# Patient Record
Sex: Female | Born: 1950
Health system: Southern US, Community
[De-identification: ages and names within clinical notes are randomized; demographics above are authoritative.]

## PROBLEM LIST (undated history)

## (undated) DIAGNOSIS — C50919 Malignant neoplasm of unspecified site of unspecified female breast: Secondary | ICD-10-CM

## (undated) DIAGNOSIS — T7840XA Allergy, unspecified, initial encounter: Secondary | ICD-10-CM

## (undated) DIAGNOSIS — F419 Anxiety disorder, unspecified: Secondary | ICD-10-CM

## (undated) DIAGNOSIS — M1990 Unspecified osteoarthritis, unspecified site: Secondary | ICD-10-CM

## (undated) DIAGNOSIS — R002 Palpitations: Secondary | ICD-10-CM

## (undated) DIAGNOSIS — K219 Gastro-esophageal reflux disease without esophagitis: Secondary | ICD-10-CM

## (undated) DIAGNOSIS — E785 Hyperlipidemia, unspecified: Secondary | ICD-10-CM

## (undated) DIAGNOSIS — Z8619 Personal history of other infectious and parasitic diseases: Secondary | ICD-10-CM

## (undated) DIAGNOSIS — I10 Essential (primary) hypertension: Secondary | ICD-10-CM

## (undated) DIAGNOSIS — Z0000 Encounter for general adult medical examination without abnormal findings: Secondary | ICD-10-CM

## (undated) DIAGNOSIS — F329 Major depressive disorder, single episode, unspecified: Secondary | ICD-10-CM

## (undated) DIAGNOSIS — K5790 Diverticulosis of intestine, part unspecified, without perforation or abscess without bleeding: Secondary | ICD-10-CM

## (undated) DIAGNOSIS — F32A Depression, unspecified: Secondary | ICD-10-CM

## (undated) DIAGNOSIS — Z923 Personal history of irradiation: Secondary | ICD-10-CM

## (undated) DIAGNOSIS — M810 Age-related osteoporosis without current pathological fracture: Secondary | ICD-10-CM

## (undated) DIAGNOSIS — M8580 Other specified disorders of bone density and structure, unspecified site: Principal | ICD-10-CM

## (undated) DIAGNOSIS — M25562 Pain in left knee: Secondary | ICD-10-CM

## (undated) HISTORY — PX: NO PAST SURGERIES: SHX2092

## (undated) HISTORY — DX: Diverticulosis of intestine, part unspecified, without perforation or abscess without bleeding: K57.90

## (undated) HISTORY — DX: Allergy, unspecified, initial encounter: T78.40XA

## (undated) HISTORY — DX: Unspecified osteoarthritis, unspecified site: M19.90

## (undated) HISTORY — DX: Depression, unspecified: F32.A

## (undated) HISTORY — PX: MASTECTOMY: SHX3

## (undated) HISTORY — DX: Hyperlipidemia, unspecified: E78.5

## (undated) HISTORY — DX: Essential (primary) hypertension: I10

## (undated) HISTORY — DX: Age-related osteoporosis without current pathological fracture: M81.0

## (undated) HISTORY — DX: Encounter for general adult medical examination without abnormal findings: Z00.00

## (undated) HISTORY — DX: Gastro-esophageal reflux disease without esophagitis: K21.9

## (undated) HISTORY — DX: Anxiety disorder, unspecified: F41.9

## (undated) HISTORY — DX: Palpitations: R00.2

## (undated) HISTORY — DX: Major depressive disorder, single episode, unspecified: F32.9

## (undated) HISTORY — DX: Personal history of other infectious and parasitic diseases: Z86.19

## (undated) HISTORY — DX: Pain in left knee: M25.562

## (undated) HISTORY — DX: Other specified disorders of bone density and structure, unspecified site: M85.80

---

## 2001-11-06 ENCOUNTER — Emergency Department (HOSPITAL_COMMUNITY): Admission: EM | Admit: 2001-11-06 | Discharge: 2001-11-06 | Payer: Self-pay | Admitting: Emergency Medicine

## 2001-11-06 ENCOUNTER — Encounter: Payer: Self-pay | Admitting: Emergency Medicine

## 2008-05-22 LAB — HM DEXA SCAN

## 2010-02-19 LAB — HM PAP SMEAR: HM Pap smear: NORMAL

## 2010-03-22 LAB — HM MAMMOGRAPHY: HM Mammogram: NORMAL

## 2010-09-20 LAB — HM COLONOSCOPY

## 2010-09-21 ENCOUNTER — Telehealth: Payer: Self-pay | Admitting: *Deleted

## 2010-09-23 ENCOUNTER — Ambulatory Visit (AMBULATORY_SURGERY_CENTER): Payer: BC Managed Care – PPO | Admitting: *Deleted

## 2010-09-23 VITALS — Ht 62.0 in | Wt 145.0 lb

## 2010-09-23 DIAGNOSIS — Z1211 Encounter for screening for malignant neoplasm of colon: Secondary | ICD-10-CM

## 2010-09-23 MED ORDER — PEG-KCL-NACL-NASULF-NA ASC-C 100 G PO SOLR: 1.0000 | Freq: Once | ORAL | Status: AC

## 2010-09-26 ENCOUNTER — Encounter: Payer: Self-pay | Admitting: Internal Medicine

## 2010-09-28 ENCOUNTER — Telehealth: Payer: Self-pay | Admitting: Internal Medicine

## 2010-09-28 NOTE — Telephone Encounter (Signed)
Questions answered re: family members at bedside and medications.

## 2010-09-29 ENCOUNTER — Emergency Department (HOSPITAL_COMMUNITY)
Admission: EM | Admit: 2010-09-29 | Discharge: 2010-09-29 | Disposition: A | Discharge status: Home / Self Care | Payer: BC Managed Care – PPO | Attending: Emergency Medicine | Admitting: Emergency Medicine

## 2010-09-29 ENCOUNTER — Emergency Department (HOSPITAL_COMMUNITY): Payer: BC Managed Care – PPO

## 2010-09-29 DIAGNOSIS — I10 Essential (primary) hypertension: Secondary | ICD-10-CM | POA: Insufficient documentation

## 2010-09-29 DIAGNOSIS — R5381 Other malaise: Secondary | ICD-10-CM | POA: Insufficient documentation

## 2010-09-29 DIAGNOSIS — R5383 Other fatigue: Secondary | ICD-10-CM | POA: Insufficient documentation

## 2010-09-29 DIAGNOSIS — R072 Precordial pain: Secondary | ICD-10-CM | POA: Insufficient documentation

## 2010-09-29 LAB — DIFFERENTIAL
Basophils Absolute: 0 10*3/uL (ref 0.0–0.1)
Basophils Relative: 0 % (ref 0–1)
Eosinophils Absolute: 0.1 10*3/uL (ref 0.0–0.7)
Eosinophils Relative: 1 % (ref 0–5)
Lymphocytes Relative: 15 % (ref 12–46)
Lymphs Abs: 1.5 10*3/uL (ref 0.7–4.0)
Monocytes Absolute: 0.4 10*3/uL (ref 0.1–1.0)
Monocytes Relative: 4 % (ref 3–12)
Neutro Abs: 8 10*3/uL — ABNORMAL HIGH (ref 1.7–7.7)
Neutrophils Relative %: 81 % — ABNORMAL HIGH (ref 43–77)

## 2010-09-29 LAB — BASIC METABOLIC PANEL WITH GFR
BUN: 11 mg/dL (ref 6–23)
CO2: 27 meq/L (ref 19–32)
Calcium: 10.3 mg/dL (ref 8.4–10.5)
Chloride: 104 meq/L (ref 96–112)
Creatinine, Ser: 0.6 mg/dL (ref 0.4–1.2)
GFR calc non Af Amer: >60 mL/min
Glucose, Bld: 105 mg/dL — ABNORMAL HIGH (ref 70–99)
Potassium: 3.3 meq/L — ABNORMAL LOW (ref 3.5–5.1)
Sodium: 142 meq/L (ref 135–145)

## 2010-09-29 LAB — URINALYSIS, ROUTINE W REFLEX MICROSCOPIC
Bilirubin Urine: NEGATIVE
Glucose, UA: NEGATIVE mg/dL
Hgb urine dipstick: NEGATIVE
Ketones, ur: NEGATIVE mg/dL
Nitrite: NEGATIVE
Protein, ur: NEGATIVE mg/dL
Specific Gravity, Urine: 1.009 (ref 1.005–1.030)
Urobilinogen, UA: 0.2 mg/dL (ref 0.0–1.0)
pH: 6 (ref 5.0–8.0)

## 2010-09-29 LAB — CBC
HCT: 37.7 % (ref 36.0–46.0)
Hemoglobin: 12.6 g/dL (ref 12.0–15.0)
MCH: 28.4 pg (ref 26.0–34.0)
MCHC: 33.4 g/dL (ref 30.0–36.0)
MCV: 84.9 fL (ref 78.0–100.0)
Platelets: 214 10*3/uL (ref 150–400)
RBC: 4.44 MIL/uL (ref 3.87–5.11)
RDW: 12.7 % (ref 11.5–15.5)
WBC: 10 10*3/uL (ref 4.0–10.5)

## 2010-09-29 LAB — POCT CARDIAC MARKERS
CKMB, poc: <1 ng/mL — ABNORMAL LOW (ref 1.0–8.0)
CKMB, poc: <1 ng/mL — ABNORMAL LOW (ref 1.0–8.0)
Myoglobin, poc: 78.4 ng/mL (ref 12–200)
Myoglobin, poc: 55.6 ng/mL (ref 12–200)
Troponin i, poc: <0.05 ng/mL (ref 0.00–0.09)
Troponin i, poc: <0.05 ng/mL (ref 0.00–0.09)

## 2010-09-29 LAB — POCT PREGNANCY, URINE: Preg Test, Ur: NEGATIVE

## 2010-09-29 LAB — T4, FREE: Free T4: 1.36 ng/dL (ref 0.80–1.80)

## 2010-09-29 LAB — TSH: TSH: 0.818 u[IU]/mL (ref 0.350–4.500)

## 2010-09-29 IMAGING — CR DG CHEST 1V PORT
1 series · 1 of 1 positions shown · non-contrast
Comparison: None

CLINICAL DATA: Left-sided chest pain.

PORTABLE CHEST - 1 VIEW

[AP]
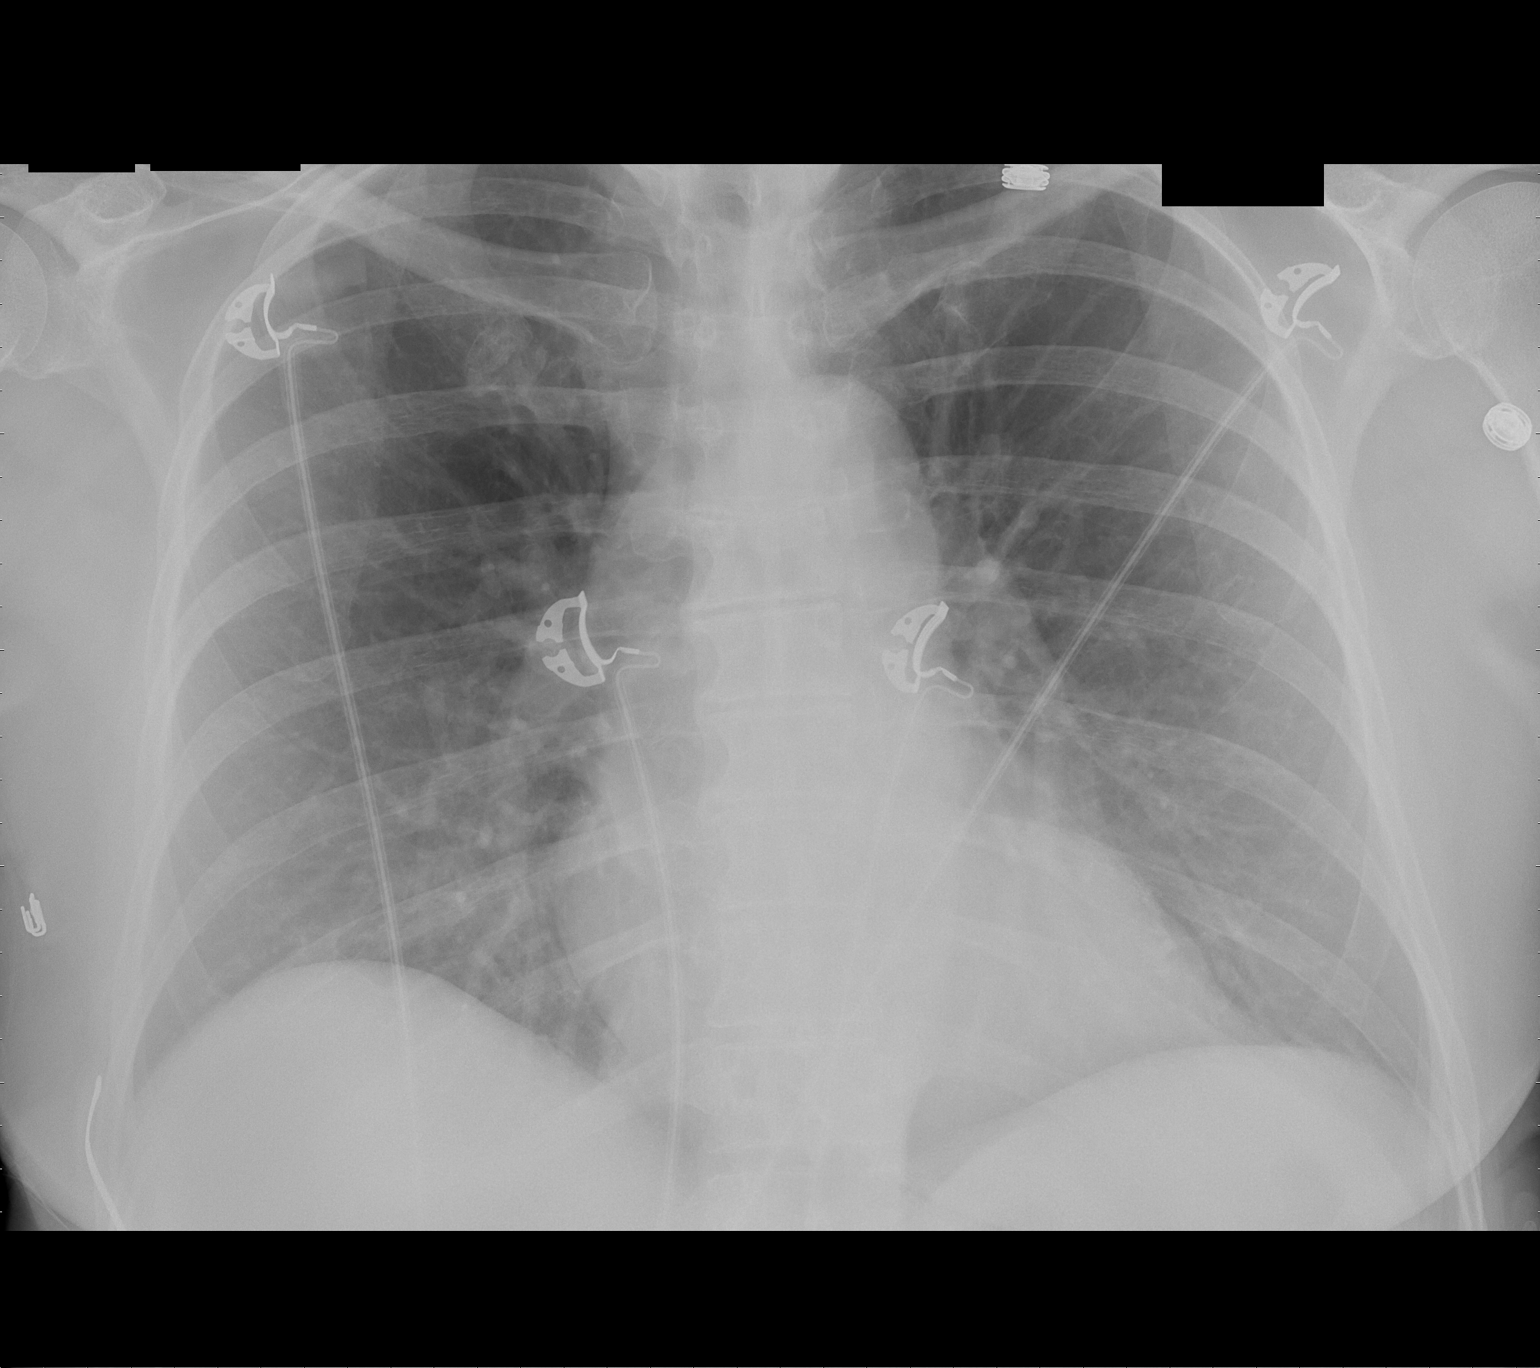

[1 of 1 positions shown; findings below may reference images not displayed]

FINDINGS: Heart is upper limits normal in size.  Lungs are clear.
No effusions or acute bony abnormality.
IMPRESSION: No active disease.

## 2010-10-07 ENCOUNTER — Ambulatory Visit (AMBULATORY_SURGERY_CENTER): Payer: BC Managed Care – PPO | Admitting: Internal Medicine

## 2010-10-07 ENCOUNTER — Encounter: Payer: Self-pay | Admitting: Internal Medicine

## 2010-10-07 VITALS — BP 145/92 | HR 87 | Temp 97.6°F | Resp 18 | Ht 62.0 in | Wt 141.0 lb

## 2010-10-07 DIAGNOSIS — K5730 Diverticulosis of large intestine without perforation or abscess without bleeding: Secondary | ICD-10-CM

## 2010-10-07 DIAGNOSIS — Z1211 Encounter for screening for malignant neoplasm of colon: Secondary | ICD-10-CM

## 2010-10-07 DIAGNOSIS — K5790 Diverticulosis of intestine, part unspecified, without perforation or abscess without bleeding: Secondary | ICD-10-CM

## 2010-10-07 HISTORY — DX: Diverticulosis of intestine, part unspecified, without perforation or abscess without bleeding: K57.90

## 2010-10-07 MED ORDER — SODIUM CHLORIDE 0.9 % IV SOLN: 500.0000 mL | INTRAVENOUS | Status: DC

## 2010-10-07 NOTE — Patient Instructions (Signed)
Diverticulosis.  Repeat colonoscopy in 10 yrs.  Refer to blue and green discharge instruction sheet.

## 2010-10-10 ENCOUNTER — Telehealth: Payer: Self-pay

## 2010-10-10 NOTE — Telephone Encounter (Signed)
No caller ID on answering machine. 

## 2011-01-13 ENCOUNTER — Telehealth: Payer: Self-pay | Admitting: Internal Medicine

## 2011-01-13 NOTE — Telephone Encounter (Signed)
Patient calling to report that starting in July, she had rectal bleeding off and on. States she would have bright red blood with stool but not with every stool. She went to her PCP and was given Anusol HC suppositories BID. She has used 2 rounds of this and went back to her PCP today. PCP states she cannot see internal hemorrhoids

## 2011-01-13 NOTE — Telephone Encounter (Signed)
Patient calling to report that starting in July, she had rectal bleeding off and on. States she would have bright, red blood with stool but not with every stool. She went to her PCP and was given Anusol HC suppositories BID. She has used 2 rounds of this and went back to her PCP today. PCP states she cannot see internal hemorrhoids and recommended she see her GI. Last colon- 10/07/10 -mod. Diverticulosis. Scheduled with Mike Gip, PA on 01/17/11 at 3:30 PM.

## 2011-01-14 NOTE — Telephone Encounter (Signed)
I think it would appropriate to treat her empirically with Anusol HC supp and then, if she still bleeds, have her see a PA or an MD. The fact that she had a normal colonoscopy 3 months ago should help You to make that decision. I think if somebody has  a normal colonoscopy within 1 year and the bleeding is low volume, they ought to be treated empirically.

## 2011-01-17 ENCOUNTER — Ambulatory Visit: Payer: BC Managed Care – PPO | Admitting: Internal Medicine

## 2011-01-17 ENCOUNTER — Encounter: Payer: Self-pay | Admitting: Physician Assistant

## 2011-01-17 ENCOUNTER — Other Ambulatory Visit (INDEPENDENT_AMBULATORY_CARE_PROVIDER_SITE_OTHER): Payer: BC Managed Care – PPO

## 2011-01-17 ENCOUNTER — Ambulatory Visit (INDEPENDENT_AMBULATORY_CARE_PROVIDER_SITE_OTHER): Payer: BC Managed Care – PPO | Admitting: Physician Assistant

## 2011-01-17 VITALS — BP 138/82 | HR 98 | Ht 62.0 in | Wt 135.6 lb

## 2011-01-17 DIAGNOSIS — K649 Unspecified hemorrhoids: Secondary | ICD-10-CM | POA: Insufficient documentation

## 2011-01-17 DIAGNOSIS — K5790 Diverticulosis of intestine, part unspecified, without perforation or abscess without bleeding: Secondary | ICD-10-CM | POA: Insufficient documentation

## 2011-01-17 DIAGNOSIS — K5730 Diverticulosis of large intestine without perforation or abscess without bleeding: Secondary | ICD-10-CM

## 2011-01-17 DIAGNOSIS — M1990 Unspecified osteoarthritis, unspecified site: Secondary | ICD-10-CM | POA: Insufficient documentation

## 2011-01-17 DIAGNOSIS — K625 Hemorrhage of anus and rectum: Secondary | ICD-10-CM

## 2011-01-17 LAB — CBC WITH DIFFERENTIAL/PLATELET
Basophils Absolute: 0 10*3/uL (ref 0.0–0.1)
Basophils Relative: 0.5 % (ref 0.0–3.0)
Eosinophils Absolute: 0.2 10*3/uL (ref 0.0–0.7)
Eosinophils Relative: 2.2 % (ref 0.0–5.0)
HCT: 36.7 % (ref 36.0–46.0)
Hemoglobin: 12.2 g/dL (ref 12.0–15.0)
Lymphocytes Relative: 21.4 % (ref 12.0–46.0)
Lymphs Abs: 1.9 10*3/uL (ref 0.7–4.0)
MCHC: 33.2 g/dL (ref 30.0–36.0)
MCV: 88.2 fl (ref 78.0–100.0)
Monocytes Absolute: 0.5 10*3/uL (ref 0.1–1.0)
Monocytes Relative: 6.1 % (ref 3.0–12.0)
Neutro Abs: 6.1 10*3/uL (ref 1.4–7.7)
Neutrophils Relative %: 69.8 % (ref 43.0–77.0)
Platelets: 212 10*3/uL (ref 150.0–400.0)
RBC: 4.16 Mil/uL (ref 3.87–5.11)
RDW: 13.2 % (ref 11.5–14.6)
WBC: 8.7 10*3/uL (ref 4.5–10.5)

## 2011-01-17 MED ORDER — PRAMOXINE-HC 1-2.5 % EX CREA: TOPICAL_CREAM | CUTANEOUS | Status: DC

## 2011-01-17 MED ORDER — HYDROCORTISONE ACETATE 25 MG RE SUPP: RECTAL | Status: DC

## 2011-01-17 MED ORDER — OXYCODONE HCL 5 MG PO TABS: 5.0000 mg | ORAL_TABLET | Freq: Four times a day (QID) | ORAL | Status: DC | PRN

## 2011-01-17 NOTE — Progress Notes (Signed)
Agree with Ms. Esterwood's assessment and plan. Duwane Gewirtz E. Zenora Karpel, MD, FACG   

## 2011-01-17 NOTE — Progress Notes (Addendum)
Subjective:    Patient ID: Terri Gay, female    DOB: [birth year], 60 y.o.   MRN: [id number]  HPI Terri Gay is a pleasant 60 year old white female recently known to Dr. [name] who underwent screening colonoscopy in May of 2012 and was found to have moderate sigmoid diverticulosis and an otherwise unremarkable exam. She comes in today with onset of rectal bleeding about a month ago which has been persistent since. She is quite anxious and concerned that something is "wrong". She had no symptoms of rectal bleeding prior to her colonoscopy though does state that she has had hemorrhoids in the past. She called her primary care office and was started on Anusol HC suppositories twice daily. She has been using this for approximately 2 weeks and has not noticed any decrease in her rectal bleeding. She has no complaints of abdominal pain or cramping and says her bowel movements have been normal and that  she's actually been going more frequently. She had not been having any rectal pain or discomfort but says occasionally she has noted some mild soreness and feels sore today. She has been noticing bright red blood, almost daily on the tissue and occasionally in the commode. She says her stools appear normal.    Review of Systems  Constitutional: Negative.   HENT: Negative.   Eyes: Negative.   Respiratory: Negative.   Cardiovascular: Negative.   Gastrointestinal: Positive for anal bleeding.  Genitourinary: Negative.   Musculoskeletal: Positive for arthralgias.  Skin: Negative.   Neurological: Negative.   Psychiatric/Behavioral: The patient is nervous/anxious.    Outpatient Prescriptions Prior to Visit  Medication Sig Dispense Refill  . ALPRAZolam (XANAX) 0.5 MG tablet Take 0.5 mg by mouth at bedtime as needed.        . bisoprolol-hydrochlorothiazide (ZIAC) 10-6.25 MG per tablet Take 1 tablet by mouth daily.        Marland Kitchen CALCIUM-MAGNESIUM-VITAMIN D PO Take 1 tablet by mouth daily.        . Cholecalciferol  (VITAMIN D-3) 5000 UNITS TABS Take 1 tablet by mouth daily.        . fish oil-omega-3 fatty acids 1000 MG capsule Take 2 g by mouth daily.        . Ibuprofen (ADVIL) 200 MG CAPS Take 1 capsule by mouth as needed.        . meloxicam (MOBIC) 15 MG tablet Take 7.5 mg by mouth as needed.       . Probiotic Product (PRO-BIOTIC BLEND PO) Take 2 tablets by mouth daily.        Marland Kitchen PLANT STEROLS AND STANOLS PO Take 2 tablets by mouth 3 x daily with food.         Facility-Administered Medications Prior to Visit  Medication Dose Route Frequency Provider Last Rate Last Dose  . 0.9 %  sodium chloride infusion  500 mL Intravenous Continuous Hart Carwin, MD           Objective:   Physical Exam Well-developed white female in no acute distress, pleasant, anxious, alert and oriented x3 HEENT; nontraumatic normocephalic EOMI PERRLA sclera anicteric, Neck; Supple no JVD ,Cardiovascular; regular rate and rhythm with S1-S2 no murmur rub or gallop, Pulmonary; clear bilaterally, Abdomen; soft nontender nondistended no palpable mass or hepatosplenomegaly, bowel sounds active, Rectal; exam she has 2 inflamed external hemorrhoids, nonthrombosed and tender to exam, on anoscopy there is oozing of a small amount of pinkish blood and a small internal hemorrhoid. Psych; mood and affect anxious  Assessment & Plan:  #85 60 year old female with a several week history of low volume hematochezia, exam and symptoms consistent with local anorectal bleeding secondary to internal and external hemorrhoids with inflammation and no thrombosis.  Plan; Reassurance Start sitz baths at least once daily Continue Anusol-HC suppositories at bedtime Add Analpram cream 2.5;3-4 times daily for internal and external use. She is asked to use this regularly for the next couple of weeks and then as needed. Also discussed general rectal care with use of moistened wipes etc. Plan follow up with Dr. [name] in 4 weeks.

## 2011-01-17 NOTE — Patient Instructions (Addendum)
Please go to the basement level to have your labs drawn.  Use the analpram cream 3-4 times daily for the next 3-4 weeks.  We sent a prescription to [facility].  I also sent refill prescription to [facility] for the Anusol HC Suppositories.   Follow up with Dr. Juanda Chance  In 1 month.

## 2011-01-18 ENCOUNTER — Telehealth: Payer: Self-pay | Admitting: Physician Assistant

## 2011-01-19 NOTE — Telephone Encounter (Signed)
I spoke to Terri Gay and the patient had some bleeding due to internal hemorrhoids.   I advised the pt to keep stools soft with stool softners.  Also to use the Analpram cream internally and outside the rectum and use the suppositories at bedtime. She only saw a small amount of blood on the stool this AM.  She is going to use the Colace stool softners.  I advised her to call if she has any more rectal bleeding or has any questions.

## 2011-01-24 ENCOUNTER — Telehealth: Payer: Self-pay | Admitting: Physician Assistant

## 2011-01-24 NOTE — Telephone Encounter (Signed)
Patient notified of results as per Amy Esterwood, PA 

## 2011-01-24 NOTE — Telephone Encounter (Signed)
Message copied by Daphine Deutscher on Tue Jan 24, 2011  2:10 PM ------      Message from: Yarnell, Virginia S      Created: Tue Jan 24, 2011  1:55 PM       Please let pt know her cbc was normal.

## 2011-01-26 ENCOUNTER — Encounter: Payer: Self-pay | Admitting: Internal Medicine

## 2011-01-26 ENCOUNTER — Ambulatory Visit (INDEPENDENT_AMBULATORY_CARE_PROVIDER_SITE_OTHER): Payer: BC Managed Care – PPO | Admitting: Internal Medicine

## 2011-01-26 DIAGNOSIS — F418 Other specified anxiety disorders: Secondary | ICD-10-CM | POA: Insufficient documentation

## 2011-01-26 DIAGNOSIS — F341 Dysthymic disorder: Secondary | ICD-10-CM

## 2011-01-26 DIAGNOSIS — G4700 Insomnia, unspecified: Secondary | ICD-10-CM | POA: Insufficient documentation

## 2011-01-26 NOTE — Assessment & Plan Note (Signed)
Recommend beginning effexor xr 75mg  qd. followup appt 4wks or sooner if needed.

## 2011-01-26 NOTE — Progress Notes (Signed)
  Subjective:    Patient ID: Terri Gay, female    DOB: 05-Dec-1950, 60 y.o.   MRN: [id number]  HPI Pt presents to clinic to establish care and for evaluation of anxiety and insomnia. Notes significant persistent anxiety for several months. Occurs daily and interferes with sleep as her mind races at night. External stressors include stressful job. Also notes depressive sx's including down mood and anhedonia. Possible secondary sx's include insomnia and decreased appetite. Years ago took and tolerated effexor and weaned off. Recently seen by gyn and given prescription for effexor xr 75mg  but has not yet begun. Takes xanax prn but does not control sx's currently. Denies si/hi. Wonders if sx's could be related to menopause. No other exacerbating or alleviating factors. Total time of visit ~46 minutes of which >50% spent in counseling.  Reviewed pmh, psh, medications, allergies,soc hx and fam hx    Review of Systems  Constitutional: Positive for activity change, appetite change and fatigue.  Psychiatric/Behavioral: Positive for sleep disturbance and dysphoric mood. Negative for suicidal ideas, self-injury and agitation. The patient is nervous/anxious.   All other systems reviewed and are negative.       Objective:   Physical Exam  Nursing note and vitals reviewed. Constitutional: She appears well-developed and well-nourished. No distress.  HENT:  Head: Normocephalic and atraumatic.  Right Ear: External ear normal.  Left Ear: External ear normal.  Eyes: Conjunctivae are normal. Right eye exhibits no discharge. Left eye exhibits no discharge. No scleral icterus.  Neck: Neck supple. Carotid bruit is not present.  Cardiovascular: Normal rate, regular rhythm and normal heart sounds.  Exam reveals no gallop and no friction rub.   No murmur heard. Pulmonary/Chest: Effort normal and breath sounds normal. No respiratory distress. She has no wheezes. She has no rales.  Neurological: She is alert.    Skin: Skin is warm and dry. She is not diaphoretic.  Psychiatric: Her speech is normal and behavior is normal. Judgment and thought content normal. Her mood appears anxious. Her affect is not angry, not blunt, not labile and not inappropriate. Cognition and memory are normal. She exhibits a depressed mood.          Assessment & Plan:

## 2011-01-26 NOTE — Assessment & Plan Note (Signed)
Likely contribution from depression/anxiety. Attempt otc melatonin or valerian prn.

## 2011-01-27 ENCOUNTER — Telehealth: Payer: Self-pay | Admitting: *Deleted

## 2011-01-27 MED ORDER — ZOLPIDEM TARTRATE 5 MG PO TABS: 5.0000 mg | ORAL_TABLET | Freq: Every evening | ORAL | Status: DC | PRN

## 2011-01-27 NOTE — Telephone Encounter (Signed)
Patient called and left voice message stating she had another night where she was unable to sleep. She would like to know if she could use the Xanax for sleep, or if there is something that could be prescribed for her to sleep.

## 2011-01-27 NOTE — Telephone Encounter (Signed)
Yesterday she was not interested in prescription medication. Was going to pursue valerian or melatonin otc. If changed mind then short term ambien 5mg  po qhs prn insomnia #30 no rf

## 2011-01-27 NOTE — Telephone Encounter (Signed)
Patient states she would like the [place] called in to [facility] on [address].  She would also like to know if it is safe to take effexor 75mg  and to take her xanax with it as she feels like she is about to have a  Panic attack.

## 2011-01-27 NOTE — Telephone Encounter (Signed)
Yes. We discussed yesterday the effexor is daily and xanax is prn. Hopefully won't have to use it often if effexor helps.

## 2011-01-27 NOTE — Telephone Encounter (Signed)
Call placed to patient at [phone], she was informed per Dr Rodena Medin instructions. She was advised to take Effexor during the day time and Xanax in the afternoon, if needed. She was advised to take Ambien 1-2 hours before bedtime. Patient has verbalized understanding and agrees as instructed.  Rx for Ambien 5 mg qty 30 mg daily Prn as needed for sleep.

## 2011-01-30 ENCOUNTER — Encounter: Payer: Self-pay | Admitting: Internal Medicine

## 2011-01-30 ENCOUNTER — Ambulatory Visit (INDEPENDENT_AMBULATORY_CARE_PROVIDER_SITE_OTHER): Payer: BC Managed Care – PPO | Admitting: Internal Medicine

## 2011-01-30 ENCOUNTER — Telehealth: Payer: Self-pay | Admitting: *Deleted

## 2011-01-30 DIAGNOSIS — F418 Other specified anxiety disorders: Secondary | ICD-10-CM

## 2011-01-30 DIAGNOSIS — F341 Dysthymic disorder: Secondary | ICD-10-CM

## 2011-01-30 MED ORDER — LORAZEPAM 1 MG PO TABS: 1.0000 mg | ORAL_TABLET | Freq: Three times a day (TID) | ORAL | Status: DC | PRN

## 2011-01-30 NOTE — Telephone Encounter (Signed)
Patient called back and stated she wants to be seen as she feels nervous aggitated and cant sleep book appt for this morning

## 2011-01-30 NOTE — Telephone Encounter (Signed)
Patient called and left voice message stating she is on Effexor and Ambien. Her message stated that she has taken Ambien Friday, Saturday, and Sunday, and has only been able sleep for 3 hours each night. She is also stating she has an increased amount of anxiety and has difficulty working.  Patient has scheduled office visit for 01/30/2011 @ 11:30 am with Dr Rodena Medin

## 2011-02-08 ENCOUNTER — Telehealth: Payer: Self-pay | Admitting: Internal Medicine

## 2011-02-08 NOTE — Telephone Encounter (Signed)
Received medical records from [facility]-Teresa Caswell Corwin NP

## 2011-02-08 NOTE — Telephone Encounter (Signed)
Spoke with patient and she states her hemorrhoids get better then flare again. She has been using the suppositories and cream that Mike Gip, PA prescribed on 01/17/11 and wants to know if she should continue. Instructed patient to continue medications until OV on 02/21/11 with Dr. Juanda Chance.

## 2011-02-12 NOTE — Progress Notes (Signed)
  Subjective:    Patient ID: Terri Gay, female    DOB: Jun 20, 1950, 60 y.o.   MRN: [id number]  HPI Pt presents to clinic for re-evaluation of anxiety and insomnia. Notes continued poor control of her sx's. Notes continued anxiousness and poor sleep. Tolerating effexor without adverse effect. No exacerbating or alleviating factors. No other complaints. Total time of visit approximately 28 minutes of which >50% spent in counseling.  Past Medical History  Diagnosis Date  . Hypertension   . Allergy     seasonal  . Diverticulosis 10/07/2010    Colonoscopy  . Arthritis   . Depression   . Hyperlipidemia   . Anxiety   . History of chicken pox    Past Surgical History  Procedure Date  . No past surgeries     reports that she has never smoked. She has never used smokeless tobacco. She reports that she drinks about .6 ounces of alcohol per week. She reports that she does not use illicit drugs. family history includes Arthritis in her maternal grandmother, mother, and paternal grandmother; Cancer in her father and paternal grandfather; Diabetes in her maternal grandmother and mother; Heart disease in her maternal grandmother; Hyperlipidemia in her maternal grandmother, mother, and sister; Hypertension in her maternal grandmother and mother; and Lung cancer in her father and paternal grandfather. Allergies  Allergen Reactions  . Amoxicillin Diarrhea       Review of Systems see hpi     Objective:   Physical Exam  Nursing note and vitals reviewed. Constitutional: She appears well-developed and well-nourished. No distress.  HENT:  Head: Normocephalic and atraumatic.  Skin: She is not diaphoretic.  Psychiatric: Judgment and thought content normal. Her mood appears anxious. Her affect is not angry, not blunt, not labile and not inappropriate. Her speech is not rapid and/or pressured, not delayed and not slurred. She is not agitated, not aggressive, is not hyperactive, not slowed and not  withdrawn. Cognition and memory are normal. She does not exhibit a depressed mood. She is communicative. She is attentive.          Assessment & Plan:

## 2011-02-12 NOTE — Assessment & Plan Note (Signed)
suboptimal control. Continue effexor at current dosing. Add short term prn ativan. Aware of potential for addiction and/or tolerance. Close followup. Consider psychiatry referral if sx's remain difficult to control.

## 2011-02-21 ENCOUNTER — Encounter: Payer: Self-pay | Admitting: Internal Medicine

## 2011-02-21 ENCOUNTER — Ambulatory Visit (INDEPENDENT_AMBULATORY_CARE_PROVIDER_SITE_OTHER): Payer: BC Managed Care – PPO | Admitting: Internal Medicine

## 2011-02-21 VITALS — BP 126/78 | HR 72 | Ht 62.0 in | Wt 134.2 lb

## 2011-02-21 DIAGNOSIS — K648 Other hemorrhoids: Secondary | ICD-10-CM

## 2011-02-21 MED ORDER — HYDROCORTISONE ACE-PRAMOXINE 2.5-1 % RE CREA: TOPICAL_CREAM | Freq: Three times a day (TID) | RECTAL | Status: DC | PRN

## 2011-02-21 NOTE — Progress Notes (Signed)
Terri Gay 1951-03-17 MRN [id number]    History of Present Illness:  This is a 60 year old white female with symptoms of external and internal hemorrhoids. An office visit on 01/17/2011 showed inflamed external hemorrhoids. She has been on Anusol-HC suppositories at bedtime and Analpram cream. She is about 95% improved today. She sees occasional bright red blood per rectum. Her last colonoscopy in May 2012 showed mild diverticulosis of the left colon.   Past Medical History  Diagnosis Date  . Hypertension   . Allergy     seasonal  . Diverticulosis 10/07/2010    Colonoscopy  . Arthritis   . Depression   . Hyperlipidemia   . Anxiety   . History of chicken pox    Past Surgical History  Procedure Date  . No past surgeries     reports that she has never smoked. She has never used smokeless tobacco. She reports that she drinks about .6 ounces of alcohol per week. She reports that she does not use illicit drugs. family history includes Arthritis in her maternal grandmother, mother, and paternal grandmother; Cancer in her father and paternal grandfather; Diabetes in her maternal grandmother and mother; Heart disease in her maternal grandmother; Hyperlipidemia in her maternal grandmother, mother, and sister; Hypertension in her maternal grandmother and mother; and Lung cancer in her father and paternal grandfather. Allergies  Allergen Reactions  . Amoxicillin Diarrhea        Review of Systems: Denies abdominal pain. Change in bowel habits to constipation. Uses stool softeners  The remainder of the 10 point ROS is negative except as outlined in H&P   Physical Exam: General appearance  Well developed, in no distress. Eyes- non icteric. HEENT nontraumatic, normocephalic. Mouth no lesions, tongue papillated, no cheilosis. Neck supple without adenopathy, thyroid not enlarged, no carotid bruits, no JVD. Lungs Clear to auscultation bilaterally. Cor normal S1, normal S2, regular  rhythm, no murmur,  quiet precordium. Abdomen: Soft, nontender abdomen with normoactive bowel sounds. No palpable mass. Rectal: And anoscopic exam reveals external hemorrhoids. No active inflammation. Normal sphincter tone with a small, healing anal fissure on the surface of the hemorrhoid which appears inflamed. No internal hemorrhoids. Stool is Hemoccult negative. Extremities no pedal edema. Skin no lesions. Neurological alert and oriented x 3. Psychological normal mood and affect.  Assessment and Plan:  Problem #1 Symptomatically improved internal and external hemorrhoids. There is a residual healing anal fissure in the anal canal. She is to continue on stool softeners one a day and continue Analpram cream 2.5%. She was concerned about using topical steroids and taking a Flu vaccine,  but I assured her that the topical steroids do not have a systemic affect and that it is okay to go ahead with the flu vaccine.   02/21/2011 Terri Gay

## 2011-02-21 NOTE — Patient Instructions (Addendum)
CC: Dr Hassie Bruce Cream sent to pharmacy.

## 2011-03-02 ENCOUNTER — Encounter: Payer: Self-pay | Admitting: Internal Medicine

## 2011-03-02 ENCOUNTER — Ambulatory Visit (INDEPENDENT_AMBULATORY_CARE_PROVIDER_SITE_OTHER): Payer: BC Managed Care – PPO | Admitting: Internal Medicine

## 2011-03-02 VITALS — BP 120/84 | HR 65 | Temp 98.0°F | Resp 18 | Wt 133.0 lb

## 2011-03-02 DIAGNOSIS — F341 Dysthymic disorder: Secondary | ICD-10-CM

## 2011-03-02 DIAGNOSIS — E785 Hyperlipidemia, unspecified: Secondary | ICD-10-CM

## 2011-03-02 DIAGNOSIS — G4700 Insomnia, unspecified: Secondary | ICD-10-CM

## 2011-03-02 DIAGNOSIS — Z23 Encounter for immunization: Secondary | ICD-10-CM

## 2011-03-02 DIAGNOSIS — F418 Other specified anxiety disorders: Secondary | ICD-10-CM

## 2011-03-02 NOTE — Progress Notes (Signed)
  Subjective:    Patient ID: Terri Gay, female    DOB: [birth year], 60 y.o.   MRN: [id number]  HPI Pt presents to clinic for follow up of anxiety and insomnia. Tolerating effexor daily without adverse effect. Feels much improved in terms of anxiety. Is also attempting to address potential contribution from perimenopause by bioidentical compounded hormone treatment s/p recent salivary testing. Has only been using for ~ 2wks. Continues to have insomnia but is helped by [place] 10mg  qhs prn. Has no side effects from [place]. Has been told in past has hyperlipidemia but has not required medication. No other complaints.  Past Medical History  Diagnosis Date  . Hypertension   . Allergy     seasonal  . Diverticulosis 10/07/2010    Colonoscopy  . Arthritis   . Depression   . Hyperlipidemia   . Anxiety   . History of chicken pox    Past Surgical History  Procedure Date  . No past surgeries     reports that she has never smoked. She has never used smokeless tobacco. She reports that she drinks about .6 ounces of alcohol per week. She reports that she does not use illicit drugs. family history includes Arthritis in her maternal grandmother, mother, and paternal grandmother; Cancer in her father and paternal grandfather; Diabetes in her maternal grandmother and mother; Heart disease in her maternal grandmother; Hyperlipidemia in her maternal grandmother, mother, and sister; Hypertension in her maternal grandmother and mother; and Lung cancer in her father and paternal grandfather. Allergies  Allergen Reactions  . Amoxicillin Diarrhea       Review of Systems see hpi     Objective:   Physical Exam  Nursing note and vitals reviewed. Constitutional: She appears well-developed and well-nourished. No distress.  HENT:  Head: Normocephalic and atraumatic.  Eyes: Conjunctivae are normal. No scleral icterus.  Neurological: She is alert.  Skin: She is not diaphoretic.  Psychiatric: She has a normal  mood and affect. Her behavior is normal. Thought content normal.          Assessment & Plan:

## 2011-03-02 NOTE — Assessment & Plan Note (Signed)
rf ambien 10mg  for prn use. Discourage long term use.

## 2011-03-02 NOTE — Assessment & Plan Note (Signed)
Definite improvement. Continue effexor at current dosing. Consider weaning trial at future appt if interested.

## 2011-03-02 NOTE — Assessment & Plan Note (Signed)
Encouraged low fat diet and regular aerobic exercise. Obtain lipid profile next visit

## 2011-03-03 ENCOUNTER — Other Ambulatory Visit: Payer: Self-pay | Admitting: *Deleted

## 2011-03-03 MED ORDER — VENLAFAXINE HCL ER 75 MG PO CP24: 75.0000 mg | ORAL_CAPSULE | Freq: Every day | ORAL | Status: DC

## 2011-03-03 MED ORDER — ZOLPIDEM TARTRATE 10 MG PO TABS: 10.0000 mg | ORAL_TABLET | Freq: Every evening | ORAL | Status: DC | PRN

## 2011-04-20 ENCOUNTER — Other Ambulatory Visit: Payer: Self-pay | Admitting: Physician Assistant

## 2011-05-05 ENCOUNTER — Telehealth: Payer: Self-pay | Admitting: Internal Medicine

## 2011-05-05 NOTE — Telephone Encounter (Signed)
Reviewed and agree.

## 2011-05-05 NOTE — Telephone Encounter (Signed)
Patient called to report bright, red blood on stool and tissue that started back again at Thanksgiving. She does report some "hard stools" around the time the symptoms began. Patient has started using her Analpram cream and Anusol HC suppositories. She is also taking a stool softner. Discussed care of hemorrhoids: soak in tub of warm water/sitz bath 10 minutes BID, Tucs pads, meticulous cleaning between bowel movements.  Patient will call back if this does not help symptoms.

## 2011-06-20 ENCOUNTER — Other Ambulatory Visit: Payer: Self-pay | Admitting: Physician Assistant

## 2011-06-22 ENCOUNTER — Ambulatory Visit: Payer: BC Managed Care – PPO | Admitting: Nurse Practitioner

## 2011-06-22 ENCOUNTER — Encounter: Payer: Self-pay | Admitting: Nurse Practitioner

## 2011-06-22 ENCOUNTER — Telehealth: Payer: Self-pay | Admitting: Internal Medicine

## 2011-06-22 ENCOUNTER — Ambulatory Visit (INDEPENDENT_AMBULATORY_CARE_PROVIDER_SITE_OTHER): Payer: BC Managed Care – PPO | Admitting: Nurse Practitioner

## 2011-06-22 VITALS — BP 140/80 | HR 66 | Ht 62.5 in | Wt 141.6 lb

## 2011-06-22 DIAGNOSIS — K649 Unspecified hemorrhoids: Secondary | ICD-10-CM

## 2011-06-22 DIAGNOSIS — K625 Hemorrhage of anus and rectum: Secondary | ICD-10-CM

## 2011-06-22 MED ORDER — HYDROCORTISONE ACETATE 25 MG RE SUPP: 25.0000 mg | Freq: Two times a day (BID) | RECTAL | Status: DC

## 2011-06-22 MED ORDER — PRAMOXINE-HC 1-2.5 % EX CREA: TOPICAL_CREAM | Freq: Two times a day (BID) | CUTANEOUS | Status: DC

## 2011-06-22 NOTE — Telephone Encounter (Signed)
Spoke with patient and she states she had rectal bleeding and hemorrhoid problems in December. She got it healed up and was doing better. Then over the weekend, she started having rectal bleeding again. Yesterday, when she voided she had bright, red blood drip into toilet from her rectum. She started using her suppositories again last night. She had to have a bowel movement during the night and had lots of bright, red blood on stool and in the toilet water. She is wanting to be seen since this problem keeps reoccurring. Offered appointment next week with Dr. Juanda Chance or today with extender. Patient scheduled with Willette Cluster, NP today at 3:30PM. Hx internal and external hemorrhoids, Last colon- 10/07/10 moderate diverticulosis.

## 2011-06-22 NOTE — Patient Instructions (Addendum)
We have sent another refill for the suppoistories and the rectal cream.  Call us if you have any further problems with the hemorrhoids. We have given you the Sitz bath and rectal care instructions.

## 2011-06-22 NOTE — Telephone Encounter (Signed)
I think she needs to be referred to a surgeon since she has both,  ext and internal hems, may need PPH procedure.

## 2011-06-23 ENCOUNTER — Encounter: Payer: Self-pay | Admitting: Nurse Practitioner

## 2011-06-23 DIAGNOSIS — K625 Hemorrhage of anus and rectum: Secondary | ICD-10-CM | POA: Insufficient documentation

## 2011-06-23 NOTE — Progress Notes (Addendum)
Terri Gay [id number] [birth year]   HISTORY OR PRESENT ILLNESS : Terri Gay is a 61 year old female who underwent screening colonoscopy with Dr. Juanda Chance May 2012. Since that time she's had frequent episodes of rectal bleeding. We saw the patient in August for rectal bleeding which was felt to be secondary to external hemorrhoids. Patient was treated with steroid suppositories, Analpram, and sitz baths. At her October followup visit patient was much better with only occasional rectal bleeding. A healing anal fissure was seen  and patient was advised to continue Analpram. She called back in December with recurrent rectal bleeding and was again treated with topical steroids. Following that she did okay until a few days ago when bleeding recurred. Stools aren't hard but patient thinks she may strain some. Also, tends to sit on toilet waiting on a BM.   Current Medications, Allergies, Past Medical History, Past Surgical History, Family History and Social History were reviewed in [employer] record.  PHYSICAL EXAMINATION : General: Well developed  female in no acute distress Eyes:  sclerae anicteric,conjunctive pink. Ears: Normal auditory acuity Neck: Supple, no masses.  Lungs: Clear throughout to auscultation Heart: Regular rate and rhythm Abdomen: Soft, nondistended, nontender. No masses or hepatomegaly noted. Normal bowel sounds Rectal: Several small pink hemorrhoids, one of which was very friable. On anoscopy there were a few swollen internal hemorrhoids Skin: No lesions on visible extremities Extremities: No edema or deformities noted Neurological: Oriented x 4, grossly nonfocal Psychological:  Alert and cooperative. Normal mood and affect  ASSESSMENT AND PLAN :   Painless rectal bleeding, low volume and likely secondary to hemorrhoids. Bleeding could be from internal hemorrhods but one of the external (versus a prolapsing internal hemorrhoid) was quite friable. Patient  has been using topical steroids on a very frequent basis. I am concerned that perianal skin may be getting thin as some of the hemorrhoidal tissue was friable with minimal manipulation.  Bleeding could be from both internal and external hemorrhoids. We discussed surgical evaluation if bleeding continues. She would like to try one more course of steroid suppositories for now. Will take a stool softener and be cognizant of straining.   Reviewed and agree with management. Colonoscopy performed May 2012. Terri Gay, M.D.  06/23/2011

## 2011-06-23 NOTE — Telephone Encounter (Signed)
Spoke with patient and gave her Dr. Regino Schultze recommendation. Patient wants to try medications one more time. She will call us if she decides to see a Careers adviser.

## 2011-06-30 ENCOUNTER — Ambulatory Visit: Payer: BC Managed Care – PPO | Admitting: Internal Medicine

## 2011-07-03 ENCOUNTER — Telehealth: Payer: Self-pay | Admitting: Internal Medicine

## 2011-07-03 NOTE — Telephone Encounter (Signed)
Reviewed, hemorrhoidal bleeding refractory to medical therapy. I agree with referral to Dr Reece Agar.

## 2011-07-03 NOTE — Telephone Encounter (Signed)
Spoke with patient and she had another episode of rectal bleeding this weekend. She is interested in being referred to a surgeon. She would like to see Dr.Gerkin if Dr. Juanda Chance thinks he could help her. Hx internal and external hems. Please, advise.

## 2011-07-03 NOTE — Telephone Encounter (Signed)
Left a message for patient to call me. 

## 2011-07-04 NOTE — Telephone Encounter (Signed)
Spoke with [facility] and scheduled patient with Dr. Gerrit Friends on 07/31/11 arrive at 1:45/2:15 PM. Left a message for patient to call me.

## 2011-07-04 NOTE — Telephone Encounter (Signed)
Spoke with patient and gave her appointment date and time with Dr. Gerrit Friends.

## 2011-07-31 ENCOUNTER — Ambulatory Visit (INDEPENDENT_AMBULATORY_CARE_PROVIDER_SITE_OTHER): Payer: BC Managed Care – PPO | Admitting: Surgery

## 2011-07-31 ENCOUNTER — Encounter (INDEPENDENT_AMBULATORY_CARE_PROVIDER_SITE_OTHER): Payer: Self-pay | Admitting: Surgery

## 2011-07-31 DIAGNOSIS — K649 Unspecified hemorrhoids: Secondary | ICD-10-CM

## 2011-07-31 DIAGNOSIS — K625 Hemorrhage of anus and rectum: Secondary | ICD-10-CM

## 2011-07-31 NOTE — Patient Instructions (Signed)

## 2011-07-31 NOTE — Progress Notes (Signed)
Chief Complaint  Patient presents with  . Rectal Problems    hemorrhoids - referral from Dr. [name]    HISTORY: Patient is a 61 year old white female referred by her gastroenterologist for problems with hemorrhoids. These have been an issue for approximately 9 months. Patient did undergo a colonoscopy in May 2012 which was otherwise unrevealing. Patient has had intermittent problems with rectal bleeding. She's had occasional pain. She has noted anal skin tags since pregnancy. She presents today for evaluation.  Over the past one to 2 weeks the patient has noted no significant bleeding. She has had no significant pain. She has used suppositories and topical creams in the past. She has had no prior anorectal surgery.  Past Medical History  Diagnosis Date  . Hypertension   . Allergy     seasonal  . Diverticulosis 10/07/2010    Colonoscopy  . Arthritis   . Depression   . Hyperlipidemia   . Anxiety   . History of chicken pox   . Hemorrhoids      Current Outpatient Prescriptions  Medication Sig Dispense Refill  . bisoprolol-hydrochlorothiazide (ZIAC) 10-6.25 MG per tablet Take 1 tablet by mouth daily.        . Calcium Carbonate-Vitamin D (CALCIUM + D PO) Take 1 tablet by mouth 2 (two) times daily. 1200mg   800 IU       . Cholecalciferol (VITAMIN D-3) 5000 UNITS TABS Take 1 tablet by mouth daily.        Marland Kitchen DHEA 10 MG CAPS Take 10 capsules by mouth daily.        . fish oil-omega-3 fatty acids 1000 MG capsule Take 2 capsules daily.      . hydrocortisone (ANUSOL-HC) 25 MG suppository Place 1 suppository (25 mg total) rectally 2 (two) times daily.  10 suppository  1  . Ibuprofen (ADVIL) 200 MG CAPS Take 1 capsule by mouth as needed.        Marland Kitchen MAGNESIUM CITRATE PO Take 1 tablet by mouth daily.        . NON FORMULARY Take 4 tablets by mouth at bedtime. CHOLESTENE (red yeast rice)       . NON FORMULARY Corticare-B Capsule 2 capsules twice a day       . NON FORMULARY Estriol/Testosterone  HRT  (1 ml) 0.25-0.25 mg/ml cream apply topically daily       . pramoxine-hydrocortisone (PRAMOSONE) cream Apply topically 2 (two) times daily. USe this cream twice daily outside the rectum.  29 g  1  . Probiotic Product (PRO-BIOTIC BLEND PO) Take 2 tablets by mouth daily.        . progesterone (PROMETRIUM) 100 MG capsule Take 100 mg by mouth at bedtime.        . meloxicam (MOBIC) 15 MG tablet Take 7.5 mg by mouth as needed.        Current Facility-Administered Medications  Medication Dose Route Frequency Provider Last Rate Last Dose  . 0.9 %  sodium chloride infusion  500 mL Intravenous Continuous Hart Carwin, MD         Allergies  Allergen Reactions  . Amoxicillin Diarrhea     Family History  Problem Relation Age of Onset  . Lung cancer Father   . Cancer Father     lung  . Lung cancer Paternal Grandfather   . Cancer Paternal Grandfather     lung  . Diabetes Mother   . Arthritis Mother   . Hyperlipidemia Mother   . Hypertension Mother   .  Diabetes Maternal Grandmother   . Heart disease Maternal Grandmother   . Arthritis Maternal Grandmother   . Hyperlipidemia Maternal Grandmother   . Hypertension Maternal Grandmother   . Hyperlipidemia Sister   . Arthritis Paternal Grandmother   . Colon cancer Neg Hx      History   Social History  . Marital Status: Married    Spouse Name: N/A    Number of Children: 2  . Years of Education: N/A   Occupational History  . Customer Service    Social History Main Topics  . Smoking status: Never Smoker   . Smokeless tobacco: Never Used  . Alcohol Use: 0.6 oz/week    1 Glasses of wine per week     occasionally  . Drug Use: No  . Sexually Active: None   Other Topics Concern  . None   Social History Narrative   Caffeine Use: 1 cup coffee and 1 cup tea     REVIEW OF SYSTEMS - PERTINENT POSITIVES ONLY: Intermittent rectal bleeding, none in the past week. No significant pain.  EXAM: Filed Vitals:   07/31/11 1408  BP:  134/80  Pulse: 76  Temp: 98 F (36.7 C)    HEENT: normocephalic; pupils equal and reactive; sclerae clear; dentition good; mucous membranes moist NECK:  symmetric on extension; no palpable anterior or posterior cervical lymphadenopathy; no supraclavicular masses; no tenderness CHEST: clear to auscultation bilaterally without rales, rhonchi, or wheezes CARDIAC: regular rate and rhythm without significant murmur; peripheral pulses are full GU:  External exam shows anal skin tags in the anterior midline and posterior midline. Eversion shows what appears to be a healed posterior fissure. This is now re\re epithelialized. Digital rectal exam shows normal to increased anal tone. No palpable masses. Anoscopy is performed and shows grade 1-2 internal hemorrhoids. They do not appear to be friable. There is no active bleeding. There is no ulceration. EXT:  non-tender without edema; no deformity NEURO: no gross focal deficits; no sign of tremor   LABORATORY RESULTS: See [facility] ([facility]L-Epic) for most recent results   RADIOLOGY RESULTS: See [facility] (CHL-Epic) for most recent results   IMPRESSION: #1 grade 1-2 internal hemorrhoids, quiescent #2 posterior anal fissure, healed #3 benign anal skin tags  PLAN: I had a lengthy discussion with the patient and her husband regarding internal hemorrhoids in her management. I provided her with written literature and recommendations. I suggested that she take a daily stool softener such as MiraLax. I ask her to avoid total of paper and use baby wipes or Tucks pads. Patient will contact us if she has any significant bleeding and we will renew her prescriptions for Anusol-HC suppositories and topical Analpram cream. Certainly if the patient developed significant bleeding or pain she needs to be evaluated here in the office. Otherwise she will return to see me as needed.  Velora Heckler, MD, FACS General & Endocrine Surgery [facility]   Visit Diagnoses: 1. Hemorrhoids   2. Rectal bleeding     Primary Care Physician: Letitia Libra, Ala Dach, MD, MD  GI:  Dr. [name]

## 2011-08-23 ENCOUNTER — Encounter: Payer: Self-pay | Admitting: Internal Medicine

## 2011-08-23 ENCOUNTER — Ambulatory Visit (INDEPENDENT_AMBULATORY_CARE_PROVIDER_SITE_OTHER): Payer: BC Managed Care – PPO | Admitting: Internal Medicine

## 2011-08-23 ENCOUNTER — Ambulatory Visit: Payer: BC Managed Care – PPO | Admitting: Internal Medicine

## 2011-08-23 VITALS — BP 102/78 | HR 65 | Temp 98.0°F | Resp 18 | Ht 63.5 in | Wt 137.0 lb

## 2011-08-23 DIAGNOSIS — I10 Essential (primary) hypertension: Secondary | ICD-10-CM | POA: Insufficient documentation

## 2011-08-23 DIAGNOSIS — E785 Hyperlipidemia, unspecified: Secondary | ICD-10-CM

## 2011-08-23 DIAGNOSIS — Z79899 Other long term (current) drug therapy: Secondary | ICD-10-CM

## 2011-08-23 LAB — HEPATIC FUNCTION PANEL
ALT: 12 U/L (ref 0–35)
AST: 17 U/L (ref 0–37)
Albumin: 4.4 g/dL (ref 3.5–5.2)
Alkaline Phosphatase: 49 U/L (ref 39–117)
Bilirubin, Direct: 0.1 mg/dL (ref 0.0–0.3)
Indirect Bilirubin: 0.3 mg/dL (ref 0.0–0.9)
Total Bilirubin: 0.4 mg/dL (ref 0.3–1.2)
Total Protein: 7 g/dL (ref 6.0–8.3)

## 2011-08-23 LAB — BASIC METABOLIC PANEL WITH GFR
BUN: 11 mg/dL (ref 6–23)
CO2: 30 meq/L (ref 19–32)
Calcium: 9.7 mg/dL (ref 8.4–10.5)
Chloride: 104 meq/L (ref 96–112)
Creat: 0.64 mg/dL (ref 0.50–1.10)
Glucose, Bld: 85 mg/dL (ref 70–99)
Potassium: 4 meq/L (ref 3.5–5.3)
Sodium: 142 meq/L (ref 135–145)

## 2011-08-23 LAB — LIPID PANEL
Cholesterol: 226 mg/dL — ABNORMAL HIGH (ref 0–200)
HDL: 64 mg/dL
LDL Cholesterol: 147 mg/dL — ABNORMAL HIGH (ref 0–99)
Total CHOL/HDL Ratio: 3.5 ratio
Triglycerides: 77 mg/dL
VLDL: 15 mg/dL (ref 0–40)

## 2011-08-23 MED ORDER — BISOPROLOL-HYDROCHLOROTHIAZIDE 10-6.25 MG PO TABS: 1.0000 | ORAL_TABLET | Freq: Every day | ORAL | Status: DC

## 2011-08-23 NOTE — Patient Instructions (Signed)
Please schedule chem7 v58.69 prior to next visit 

## 2011-08-23 NOTE — Assessment & Plan Note (Signed)
Obtain lipid/lft. 

## 2011-08-23 NOTE — Progress Notes (Signed)
  Subjective:    Patient ID: Terri Gay, female    DOB: [birth year], 61 y.o.   MRN: [id number]  HPI Pt presents to clinic for followup of multiple medical problems. Some variability of bp recently noted. 120-140 sbp at two other clinic appts. Tolerating medication and compliant. Attempted to wean off effexor but had increased stress at the time. utd with gyn care.  Past Medical History  Diagnosis Date  . Hypertension   . Allergy     seasonal  . Diverticulosis 10/07/2010    Colonoscopy  . Arthritis   . Depression   . Hyperlipidemia   . Anxiety   . History of chicken pox   . Hemorrhoids    Past Surgical History  Procedure Date  . No past surgeries     reports that she has never smoked. She has never used smokeless tobacco. She reports that she drinks about .6 ounces of alcohol per week. She reports that she does not use illicit drugs. family history includes Arthritis in her maternal grandmother, mother, and paternal grandmother; Cancer in her father and paternal grandfather; Diabetes in her maternal grandmother and mother; Heart disease in her maternal grandmother; Hyperlipidemia in her maternal grandmother, mother, and sister; Hypertension in her maternal grandmother and mother; and Lung cancer in her father and paternal grandfather.  There is no history of Colon cancer. Allergies  Allergen Reactions  . Amoxicillin Diarrhea      Review of Systems see hpi     Objective:   Physical Exam  Physical Exam  Nursing note and vitals reviewed. Constitutional: Appears well-developed and well-nourished. No distress.  HENT:  Head: Normocephalic and atraumatic.  Right Ear: External ear normal.  Left Ear: External ear normal.  Eyes: Conjunctivae are normal. No scleral icterus.  Neck: Neck supple. Carotid bruit is not present.  Cardiovascular: Normal rate, regular rhythm and normal heart sounds.  Exam reveals no gallop and no friction rub.   No murmur heard. Pulmonary/Chest: Effort  normal and breath sounds normal. No respiratory distress. He has no wheezes. no rales.  Lymphadenopathy:    He has no cervical adenopathy.  Neurological:Alert.  Skin: Skin is warm and dry. Not diaphoretic.  Psychiatric: Has a normal mood and affect.        Assessment & Plan:

## 2011-08-23 NOTE — Assessment & Plan Note (Signed)
Rf ziac. Recommend outpt bp log. Obtain chem7

## 2011-08-28 ENCOUNTER — Other Ambulatory Visit: Payer: Self-pay | Admitting: Internal Medicine

## 2011-08-28 DIAGNOSIS — Z79899 Other long term (current) drug therapy: Secondary | ICD-10-CM

## 2012-02-29 ENCOUNTER — Encounter: Payer: Self-pay | Admitting: Internal Medicine

## 2012-02-29 ENCOUNTER — Ambulatory Visit (INDEPENDENT_AMBULATORY_CARE_PROVIDER_SITE_OTHER): Payer: BC Managed Care – PPO | Admitting: Internal Medicine

## 2012-02-29 VITALS — BP 132/78 | HR 72 | Temp 98.6°F | Resp 14 | Ht 63.5 in | Wt 144.1 lb

## 2012-02-29 DIAGNOSIS — Z79899 Other long term (current) drug therapy: Secondary | ICD-10-CM

## 2012-02-29 DIAGNOSIS — Z23 Encounter for immunization: Secondary | ICD-10-CM

## 2012-02-29 DIAGNOSIS — I10 Essential (primary) hypertension: Secondary | ICD-10-CM

## 2012-02-29 DIAGNOSIS — K219 Gastro-esophageal reflux disease without esophagitis: Secondary | ICD-10-CM

## 2012-02-29 DIAGNOSIS — E785 Hyperlipidemia, unspecified: Secondary | ICD-10-CM

## 2012-02-29 LAB — CBC WITH DIFFERENTIAL/PLATELET
Basophils Absolute: 0.1 K/uL (ref 0.0–0.1)
Basophils Relative: 1 % (ref 0–1)
Eosinophils Absolute: 0.5 K/uL (ref 0.0–0.7)
Eosinophils Relative: 7 % — ABNORMAL HIGH (ref 0–5)
HCT: 37.4 % (ref 36.0–46.0)
Hemoglobin: 12.3 g/dL (ref 12.0–15.0)
Lymphocytes Relative: 28 % (ref 12–46)
Lymphs Abs: 2 K/uL (ref 0.7–4.0)
MCH: 28.7 pg (ref 26.0–34.0)
MCHC: 32.9 g/dL (ref 30.0–36.0)
MCV: 87.2 fL (ref 78.0–100.0)
Monocytes Absolute: 0.4 K/uL (ref 0.1–1.0)
Monocytes Relative: 6 % (ref 3–12)
Neutro Abs: 4.1 K/uL (ref 1.7–7.7)
Neutrophils Relative %: 58 % (ref 43–77)
Platelets: 223 K/uL (ref 150–400)
RBC: 4.29 MIL/uL (ref 3.87–5.11)
RDW: 12.7 % (ref 11.5–15.5)
WBC: 7.1 K/uL (ref 4.0–10.5)

## 2012-02-29 LAB — LIPID PANEL
Cholesterol: 228 mg/dL — ABNORMAL HIGH (ref 0–200)
HDL: 56 mg/dL
LDL Cholesterol: 156 mg/dL — ABNORMAL HIGH (ref 0–99)
Total CHOL/HDL Ratio: 4.1 ratio
Triglycerides: 81 mg/dL
VLDL: 16 mg/dL (ref 0–40)

## 2012-02-29 LAB — HEPATIC FUNCTION PANEL
ALT: 12 U/L (ref 0–35)
AST: 16 U/L (ref 0–37)
Albumin: 4.5 g/dL (ref 3.5–5.2)
Alkaline Phosphatase: 59 U/L (ref 39–117)
Bilirubin, Direct: 0.1 mg/dL (ref 0.0–0.3)
Indirect Bilirubin: 0.3 mg/dL (ref 0.0–0.9)
Total Bilirubin: 0.4 mg/dL (ref 0.3–1.2)
Total Protein: 7 g/dL (ref 6.0–8.3)

## 2012-02-29 LAB — BASIC METABOLIC PANEL WITH GFR
BUN: 15 mg/dL (ref 6–23)
CO2: 27 meq/L (ref 19–32)
Calcium: 9.6 mg/dL (ref 8.4–10.5)
Chloride: 105 meq/L (ref 96–112)
Creat: 0.77 mg/dL (ref 0.50–1.10)
Glucose, Bld: 83 mg/dL (ref 70–99)
Potassium: 4.1 meq/L (ref 3.5–5.3)
Sodium: 141 meq/L (ref 135–145)

## 2012-02-29 MED ORDER — OMEPRAZOLE 40 MG PO CPDR: 40.0000 mg | DELAYED_RELEASE_CAPSULE | Freq: Every day | ORAL | Status: DC

## 2012-02-29 NOTE — Progress Notes (Signed)
  Subjective:    Patient ID: Terri Gay, female    DOB: Apr 18, 1951, 61 y.o.   MRN: [id number]  HPI Pt presents to clinic for followup of multiple medical problems. Noted some GERD symptoms described as heartburn especially early evening. No associated dysphagia or abdominal pain. Not currently taking medication for the problem. No alleviating or exacerbating factors. Occurs at least three days out of the week. History of hyperlipidemia not currently maintain the medication. Blood pressure reviewed as normotensive. No other complaints.  Past Medical History  Diagnosis Date  . Hypertension   . Allergy     seasonal  . Diverticulosis 10/07/2010    Colonoscopy  . Arthritis   . Depression   . Hyperlipidemia   . Anxiety   . History of chicken pox   . Hemorrhoids    Past Surgical History  Procedure Date  . No past surgeries     reports that she has never smoked. She has never used smokeless tobacco. She reports that she drinks about .6 ounces of alcohol per week. She reports that she does not use illicit drugs. family history includes Arthritis in her maternal grandmother, mother, and paternal grandmother; Cancer in her father and paternal grandfather; Diabetes in her maternal grandmother and mother; Heart disease in her maternal grandmother; Hyperlipidemia in her maternal grandmother, mother, and sister; Hypertension in her maternal grandmother and mother; and Lung cancer in her father and paternal grandfather.  There is no history of Colon cancer. Allergies  Allergen Reactions  . Amoxicillin Diarrhea      Review of Systems see history of present illness     Objective:   Physical Exam  Physical Exam  Nursing note and vitals reviewed. Constitutional: Appears well-developed and well-nourished. No distress.  HENT:  Head: Normocephalic and atraumatic.  Right Ear: External ear normal.  Left Ear: External ear normal.  Eyes: Conjunctivae are normal. No scleral icterus.  Neck: Neck  supple. Carotid bruit is not present.  Cardiovascular: Normal rate, regular rhythm and normal heart sounds.  Exam reveals no gallop and no friction rub.   No murmur heard. Pulmonary/Chest: Effort normal and breath sounds normal. No respiratory distress. He has no wheezes. no rales.  Lymphadenopathy:    He has no cervical adenopathy.  Neurological:Alert.  Skin: Skin is warm and dry. Not diaphoretic.  Psychiatric: Has a normal mood and affect.        Assessment & Plan:

## 2012-03-03 DIAGNOSIS — K219 Gastro-esophageal reflux disease without esophagitis: Secondary | ICD-10-CM | POA: Insufficient documentation

## 2012-03-03 NOTE — Assessment & Plan Note (Signed)
Attempt omeprazole 40 mg a day. Follow up if symptoms not controlled with approximately 3-4 weeks.

## 2012-03-03 NOTE — Assessment & Plan Note (Signed)
Normotensive and stable. Continue current regimen. Monitor bp as outpt and followup in clinic as scheduled.  

## 2012-03-03 NOTE — Assessment & Plan Note (Signed)
Obtain fasting lipid profile 

## 2012-03-07 ENCOUNTER — Telehealth: Payer: Self-pay | Admitting: *Deleted

## 2012-03-07 NOTE — Telephone Encounter (Signed)
Pt called requesting results from blood work done on 02/29/12. Please advise.

## 2012-03-08 NOTE — Telephone Encounter (Signed)
Chol remains elevated without significant change. In addition to low fat diet/exercise would recommend attempting zetia 10mg  po qd #30 rf6. Is not a statin and may help enough

## 2012-03-08 NOTE — Telephone Encounter (Signed)
Patient is calling back about her labs  Done on /10/10

## 2012-03-08 NOTE — Telephone Encounter (Signed)
Notified pt and she reports that she has not been following a low fat diet and little exercise.  States that she will be more compliant with her diet and has started walking at lunch of a day. Reports that she has also bought Red Yeast Rice and plans to start that. Pt requested low fat diet info. Copy mailed to pt along with her results.

## 2012-03-08 NOTE — Telephone Encounter (Signed)
Notified pt of normal results except elevated cholesterol levels. Please advise of further instructions.

## 2012-03-19 ENCOUNTER — Other Ambulatory Visit: Payer: Self-pay | Admitting: Nurse Practitioner

## 2012-03-20 ENCOUNTER — Telehealth: Payer: Self-pay | Admitting: *Deleted

## 2012-03-20 NOTE — Telephone Encounter (Signed)
Spoke to [employer] .  Gunnar Fusi last saw her 06-22-2011 and prescribed Anusol HC Supposirtories .  Patient was referred to Dr. Darnell Level at [facility] by Dr. [name].  Patient saw Dr. Gerrit Friends 07/2011.  Gunnar Fusi agreed to fill this refill today but she will need to call Dr. Gerrit Friends or Dr. Juanda Chance for further refills.

## 2012-06-06 ENCOUNTER — Encounter (HOSPITAL_COMMUNITY): Payer: Self-pay | Admitting: Emergency Medicine

## 2012-06-06 ENCOUNTER — Emergency Department (HOSPITAL_COMMUNITY): Payer: BC Managed Care – PPO

## 2012-06-06 ENCOUNTER — Emergency Department (HOSPITAL_COMMUNITY)
Admission: EM | Admit: 2012-06-06 | Discharge: 2012-06-06 | Disposition: A | Discharge status: Home / Self Care | Payer: BC Managed Care – PPO | Attending: Emergency Medicine | Admitting: Emergency Medicine

## 2012-06-06 DIAGNOSIS — F411 Generalized anxiety disorder: Secondary | ICD-10-CM | POA: Insufficient documentation

## 2012-06-06 DIAGNOSIS — E785 Hyperlipidemia, unspecified: Secondary | ICD-10-CM | POA: Insufficient documentation

## 2012-06-06 DIAGNOSIS — M129 Arthropathy, unspecified: Secondary | ICD-10-CM | POA: Insufficient documentation

## 2012-06-06 DIAGNOSIS — S298XXA Other specified injuries of thorax, initial encounter: Secondary | ICD-10-CM | POA: Insufficient documentation

## 2012-06-06 DIAGNOSIS — I10 Essential (primary) hypertension: Secondary | ICD-10-CM | POA: Insufficient documentation

## 2012-06-06 DIAGNOSIS — F3289 Other specified depressive episodes: Secondary | ICD-10-CM | POA: Insufficient documentation

## 2012-06-06 DIAGNOSIS — Z8719 Personal history of other diseases of the digestive system: Secondary | ICD-10-CM | POA: Insufficient documentation

## 2012-06-06 DIAGNOSIS — Z79899 Other long term (current) drug therapy: Secondary | ICD-10-CM | POA: Insufficient documentation

## 2012-06-06 DIAGNOSIS — Y92410 Unspecified street and highway as the place of occurrence of the external cause: Secondary | ICD-10-CM | POA: Insufficient documentation

## 2012-06-06 DIAGNOSIS — Y9389 Activity, other specified: Secondary | ICD-10-CM | POA: Insufficient documentation

## 2012-06-06 DIAGNOSIS — F329 Major depressive disorder, single episode, unspecified: Secondary | ICD-10-CM | POA: Insufficient documentation

## 2012-06-06 DIAGNOSIS — Z8619 Personal history of other infectious and parasitic diseases: Secondary | ICD-10-CM | POA: Insufficient documentation
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
## 2012-06-06 MED ORDER — METHOCARBAMOL 500 MG PO TABS: 500.0000 mg | ORAL_TABLET | Freq: Two times a day (BID) | ORAL | Status: DC
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
## 2012-06-06 NOTE — ED Provider Notes (Signed)
Medical screening examination/treatment/procedure(s) were performed by non-physician practitioner and as supervising physician I was immediately available for consultation/collaboration.   Goldie Dimmer, MD 06/06/12 1426 

## 2012-09-13 ENCOUNTER — Emergency Department (HOSPITAL_COMMUNITY): Payer: BC Managed Care – PPO

## 2012-09-13 ENCOUNTER — Emergency Department (HOSPITAL_COMMUNITY)
Admission: EM | Admit: 2012-09-13 | Discharge: 2012-09-13 | Disposition: A | Discharge status: Home / Self Care | Payer: BC Managed Care – PPO | Attending: Emergency Medicine | Admitting: Emergency Medicine

## 2012-09-13 ENCOUNTER — Other Ambulatory Visit: Payer: Self-pay

## 2012-09-13 ENCOUNTER — Encounter (HOSPITAL_COMMUNITY): Payer: Self-pay | Admitting: Emergency Medicine

## 2012-09-13 DIAGNOSIS — Z8619 Personal history of other infectious and parasitic diseases: Secondary | ICD-10-CM | POA: Insufficient documentation

## 2012-09-13 DIAGNOSIS — Z8739 Personal history of other diseases of the musculoskeletal system and connective tissue: Secondary | ICD-10-CM | POA: Insufficient documentation

## 2012-09-13 DIAGNOSIS — Z8679 Personal history of other diseases of the circulatory system: Secondary | ICD-10-CM | POA: Insufficient documentation

## 2012-09-13 DIAGNOSIS — Z8709 Personal history of other diseases of the respiratory system: Secondary | ICD-10-CM | POA: Insufficient documentation

## 2012-09-13 DIAGNOSIS — E785 Hyperlipidemia, unspecified: Secondary | ICD-10-CM | POA: Insufficient documentation

## 2012-09-13 DIAGNOSIS — Z79899 Other long term (current) drug therapy: Secondary | ICD-10-CM | POA: Insufficient documentation

## 2012-09-13 DIAGNOSIS — K209 Esophagitis, unspecified without bleeding: Secondary | ICD-10-CM

## 2012-09-13 DIAGNOSIS — I10 Essential (primary) hypertension: Secondary | ICD-10-CM | POA: Insufficient documentation

## 2012-09-13 DIAGNOSIS — Z8719 Personal history of other diseases of the digestive system: Secondary | ICD-10-CM | POA: Insufficient documentation

## 2012-09-13 DIAGNOSIS — Z8659 Personal history of other mental and behavioral disorders: Secondary | ICD-10-CM | POA: Insufficient documentation

## 2012-09-13 LAB — BASIC METABOLIC PANEL WITH GFR
BUN: 11 mg/dL (ref 6–23)
CO2: 29 meq/L (ref 19–32)
Calcium: 10.3 mg/dL (ref 8.4–10.5)
Chloride: 102 meq/L (ref 96–112)
Creatinine, Ser: 0.73 mg/dL (ref 0.50–1.10)
GFR calc Af Amer: >90 mL/min
GFR calc non Af Amer: >90 mL/min
Glucose, Bld: 96 mg/dL (ref 70–99)
Potassium: 4.3 meq/L (ref 3.5–5.1)
Sodium: 140 meq/L (ref 135–145)

## 2012-09-13 LAB — URINALYSIS, ROUTINE W REFLEX MICROSCOPIC
Bilirubin Urine: NEGATIVE
Glucose, UA: NEGATIVE mg/dL
Hgb urine dipstick: NEGATIVE
Ketones, ur: NEGATIVE mg/dL
Leukocytes, UA: NEGATIVE
Nitrite: NEGATIVE
Protein, ur: NEGATIVE mg/dL
Specific Gravity, Urine: 1.006 (ref 1.005–1.030)
Urobilinogen, UA: 0.2 mg/dL (ref 0.0–1.0)
pH: 6.5 (ref 5.0–8.0)

## 2012-09-13 LAB — HEPATIC FUNCTION PANEL
ALT: 17 U/L (ref 0–35)
AST: 21 U/L (ref 0–37)
Albumin: 4.3 g/dL (ref 3.5–5.2)
Alkaline Phosphatase: 65 U/L (ref 39–117)
Bilirubin, Direct: <0.1 mg/dL (ref 0.0–0.3)
Total Bilirubin: 0.3 mg/dL (ref 0.3–1.2)
Total Protein: 8 g/dL (ref 6.0–8.3)

## 2012-09-13 LAB — CBC
HCT: 39.1 % (ref 36.0–46.0)
Hemoglobin: 13.2 g/dL (ref 12.0–15.0)
MCH: 28.8 pg (ref 26.0–34.0)
MCHC: 33.8 g/dL (ref 30.0–36.0)
MCV: 85.2 fL (ref 78.0–100.0)
Platelets: 192 10*3/uL (ref 150–400)
RBC: 4.59 MIL/uL (ref 3.87–5.11)
RDW: 13.3 % (ref 11.5–15.5)
WBC: 9.1 10*3/uL (ref 4.0–10.5)

## 2012-09-13 LAB — POCT I-STAT TROPONIN I: Troponin i, poc: 0 ng/mL (ref 0.00–0.08)

## 2012-09-13 LAB — LIPASE, BLOOD: Lipase: 45 U/L (ref 11–59)

## 2012-09-13 LAB — AMYLASE: Amylase: 74 U/L (ref 0–105)

## 2012-09-13 IMAGING — CT CT ABD-PELV W/ CM
2 of 5 series · 15 of 46 positions shown, 17 images · IV contrast (omnipaque)
Comparison: None.

CLINICAL DATA: Left upper quadrant tenderness

CT ABDOMEN AND PELVIS WITH CONTRAST
TECHNIQUE: Multidetector CT imaging of the abdomen and pelvis was
performed following the standard protocol during bolus
administration of intravenous contrast.
Contrast: 100mL OMNIPAQUE IOHEXOL 300 MG/ML  SOLN

[Series 2: abd/pelv with 5.0 b31f st · axial · 0.70mm/px · z∈[-473,-68]mm · 12 of 91 slices shown, 14 images]
[im 5/91  soft-tissue]
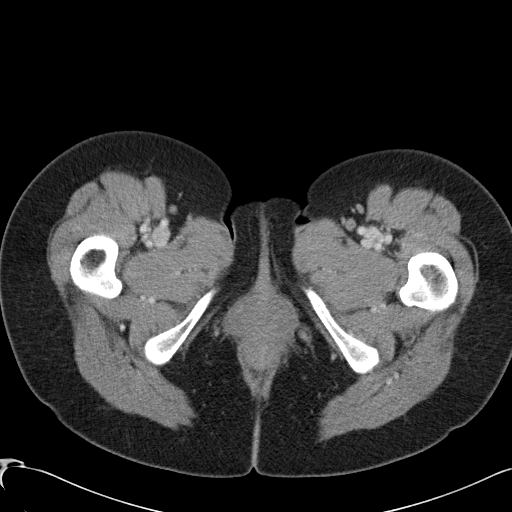
[im 5/91  bone]
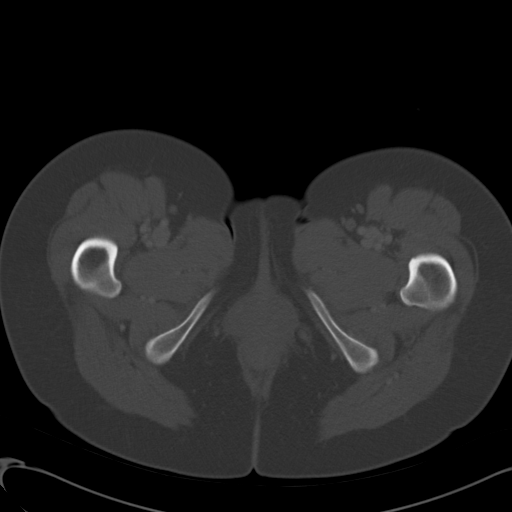
[im 14/91  soft-tissue]
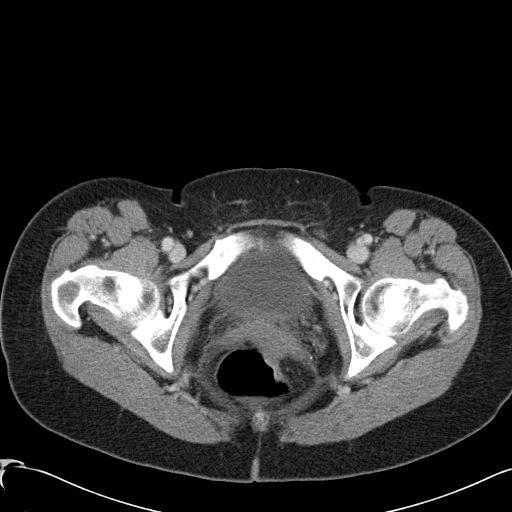
[im 19/91  soft-tissue]
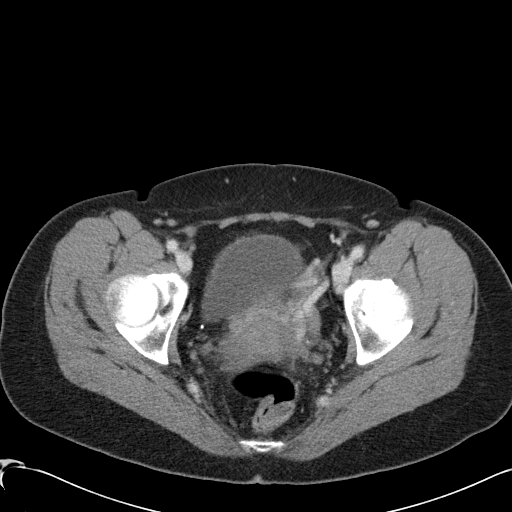
[im 28/91  soft-tissue]
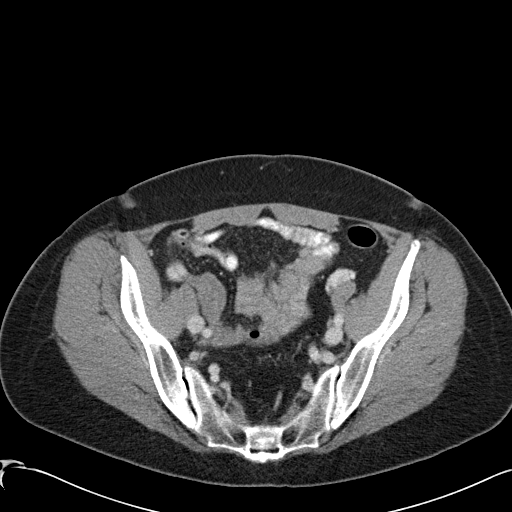
[im 37/91  soft-tissue]
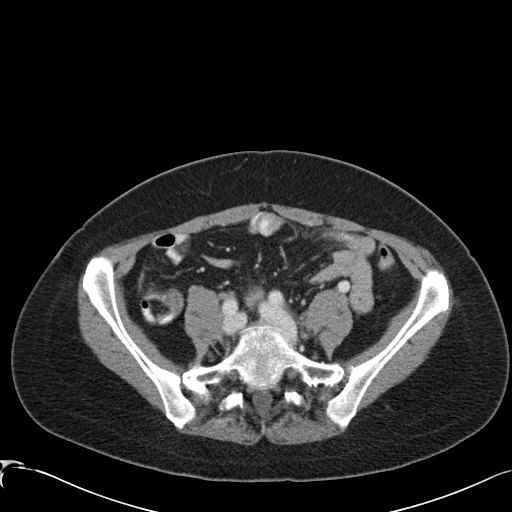
[im 41/91  soft-tissue]
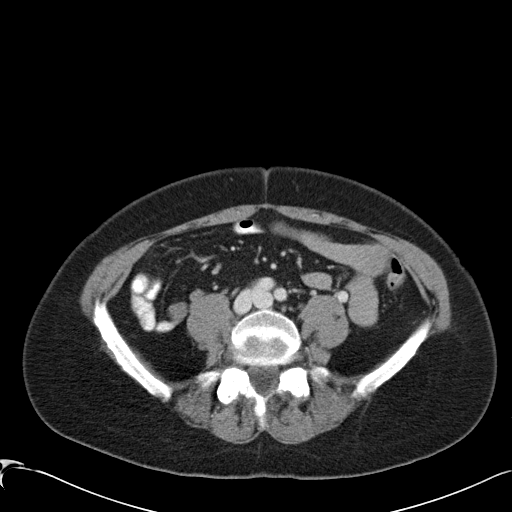
[im 50/91  soft-tissue]
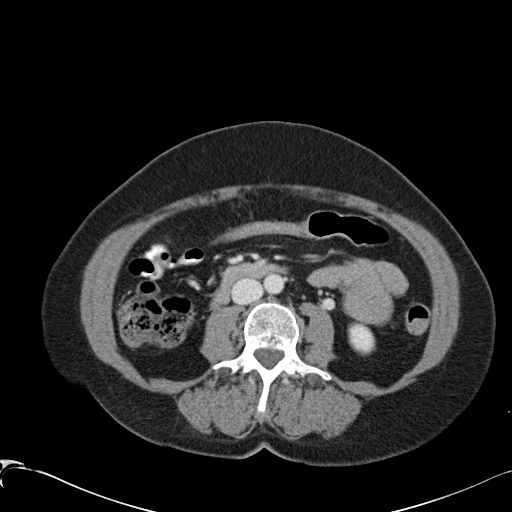
[im 55/91  soft-tissue]
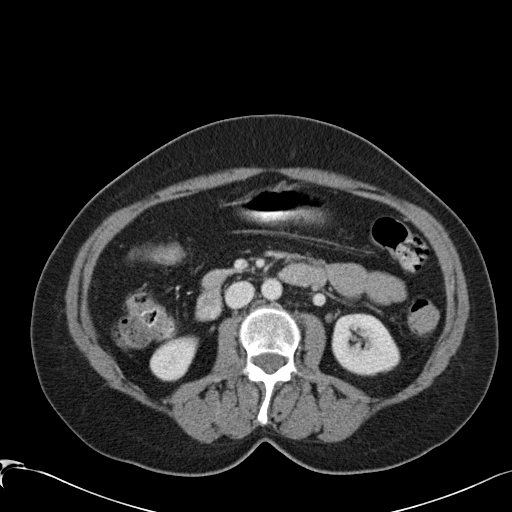
[im 64/91  soft-tissue]
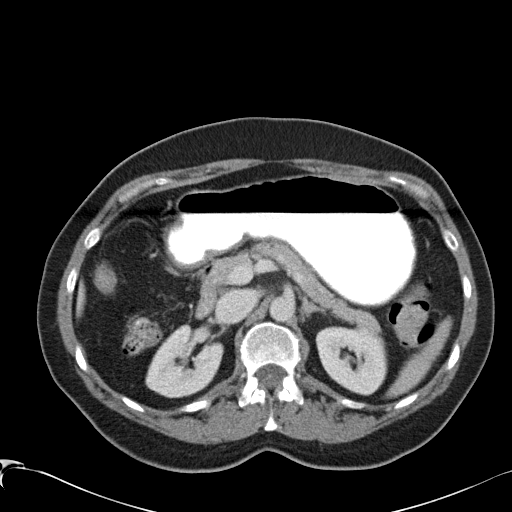
[im 64/91  bone]
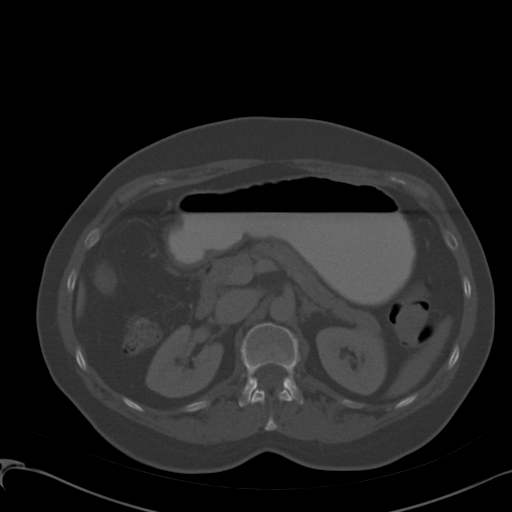
[im 73/91  soft-tissue]
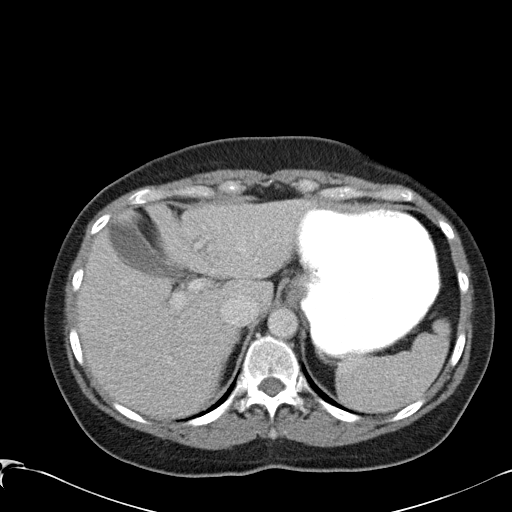
[im 77/91  soft-tissue]
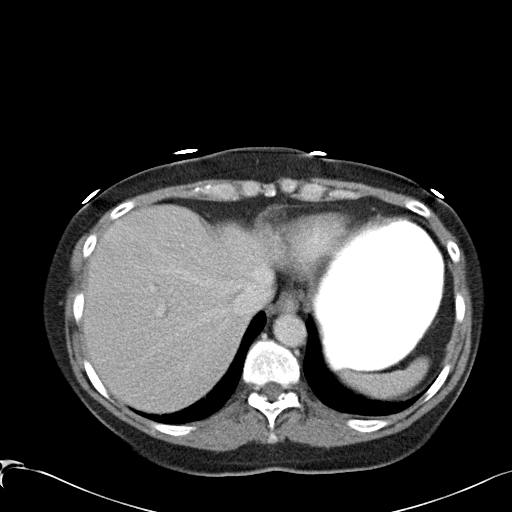
[im 86/91  soft-tissue]
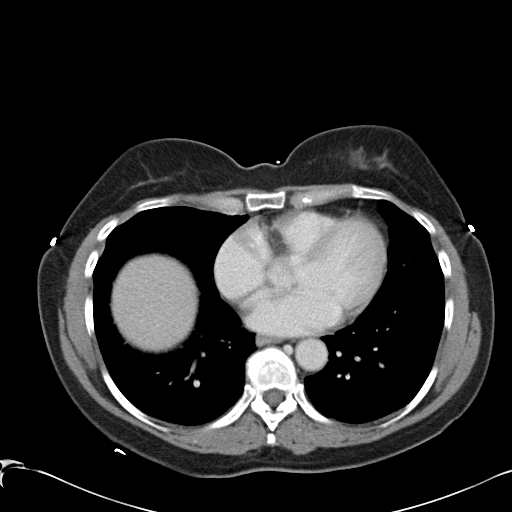

[Series 5: coronals · coronal · 0.53mm/px · 3 of 105 slices shown]
[im 35/105  soft-tissue]
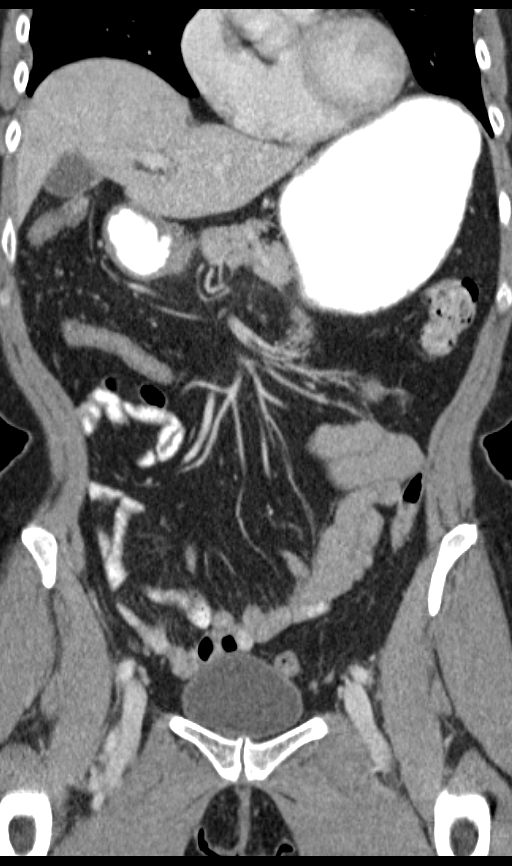
[im 47/105  soft-tissue]
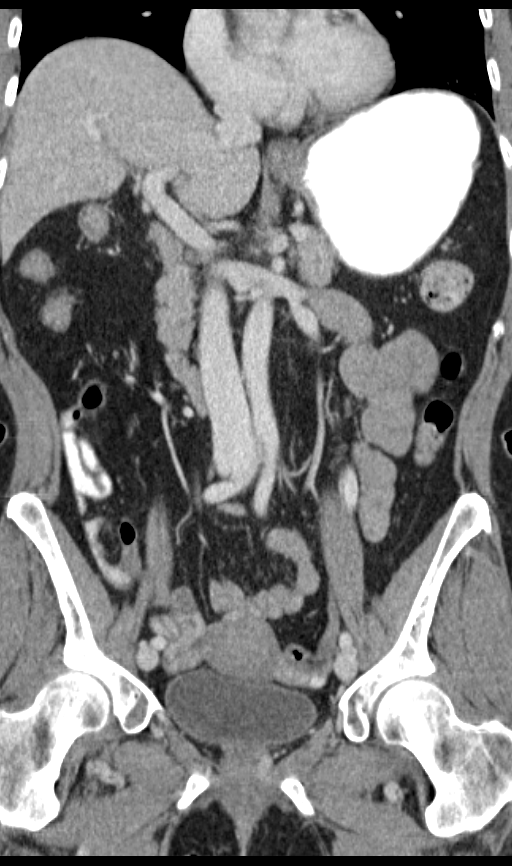
[im 58/105  soft-tissue]
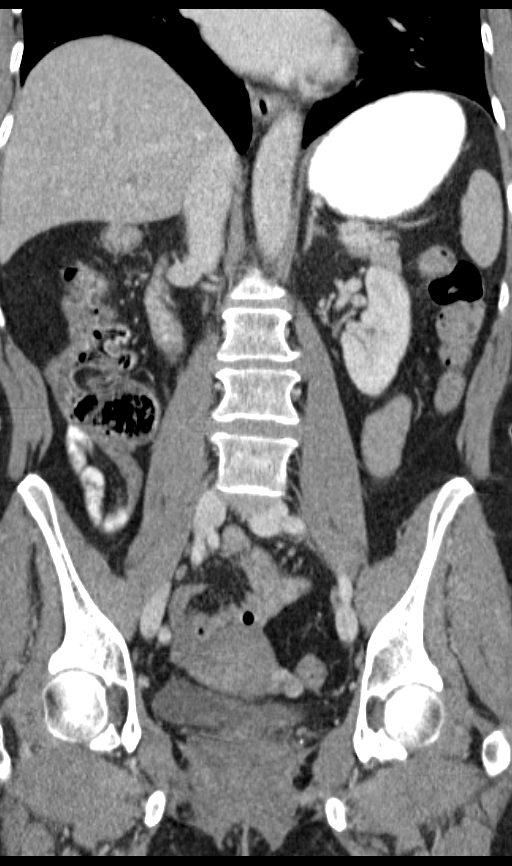

[15 of 46 positions shown; findings below may reference images not displayed]

FINDINGS: Mild dependent atelectasis at the lung bases.

Mild diffuse hepatic steatosis.

Gallbladder, spleen, pancreas, adrenal glands, kidneys are within
normal limits.

There is mild wall thickening of the lower esophagus.  No obvious
hiatal hernia.

The left ovarian vein is prominent and opacifies with contrast.
Least to pelvic varices adjacent to the left ovary.

Trace free fluid in the right hemi pelvis.

Bladder, uterus, and right adnexa are unremarkable.

No evidence of sigmoid diverticulitis.  Minimal diverticulosis of
the descending colon is present.
IMPRESSION: Mild wall thickening of the lower esophagus.  The findings may be
due to inflammatory process or edema related to reflux. Esophagram
may be helpful.

Prominent left ovarian vein leading to varices.  This may be due to
venous insufficiency and can be associated with pelvic congestion
syndrome.

## 2012-09-13 IMAGING — CR DG CHEST 2V
2 series · 2 of 2 positions shown · non-contrast
Comparison: [DATE].

CLINICAL DATA: Chest pain

CHEST - 2 VIEW

[w chest pa]
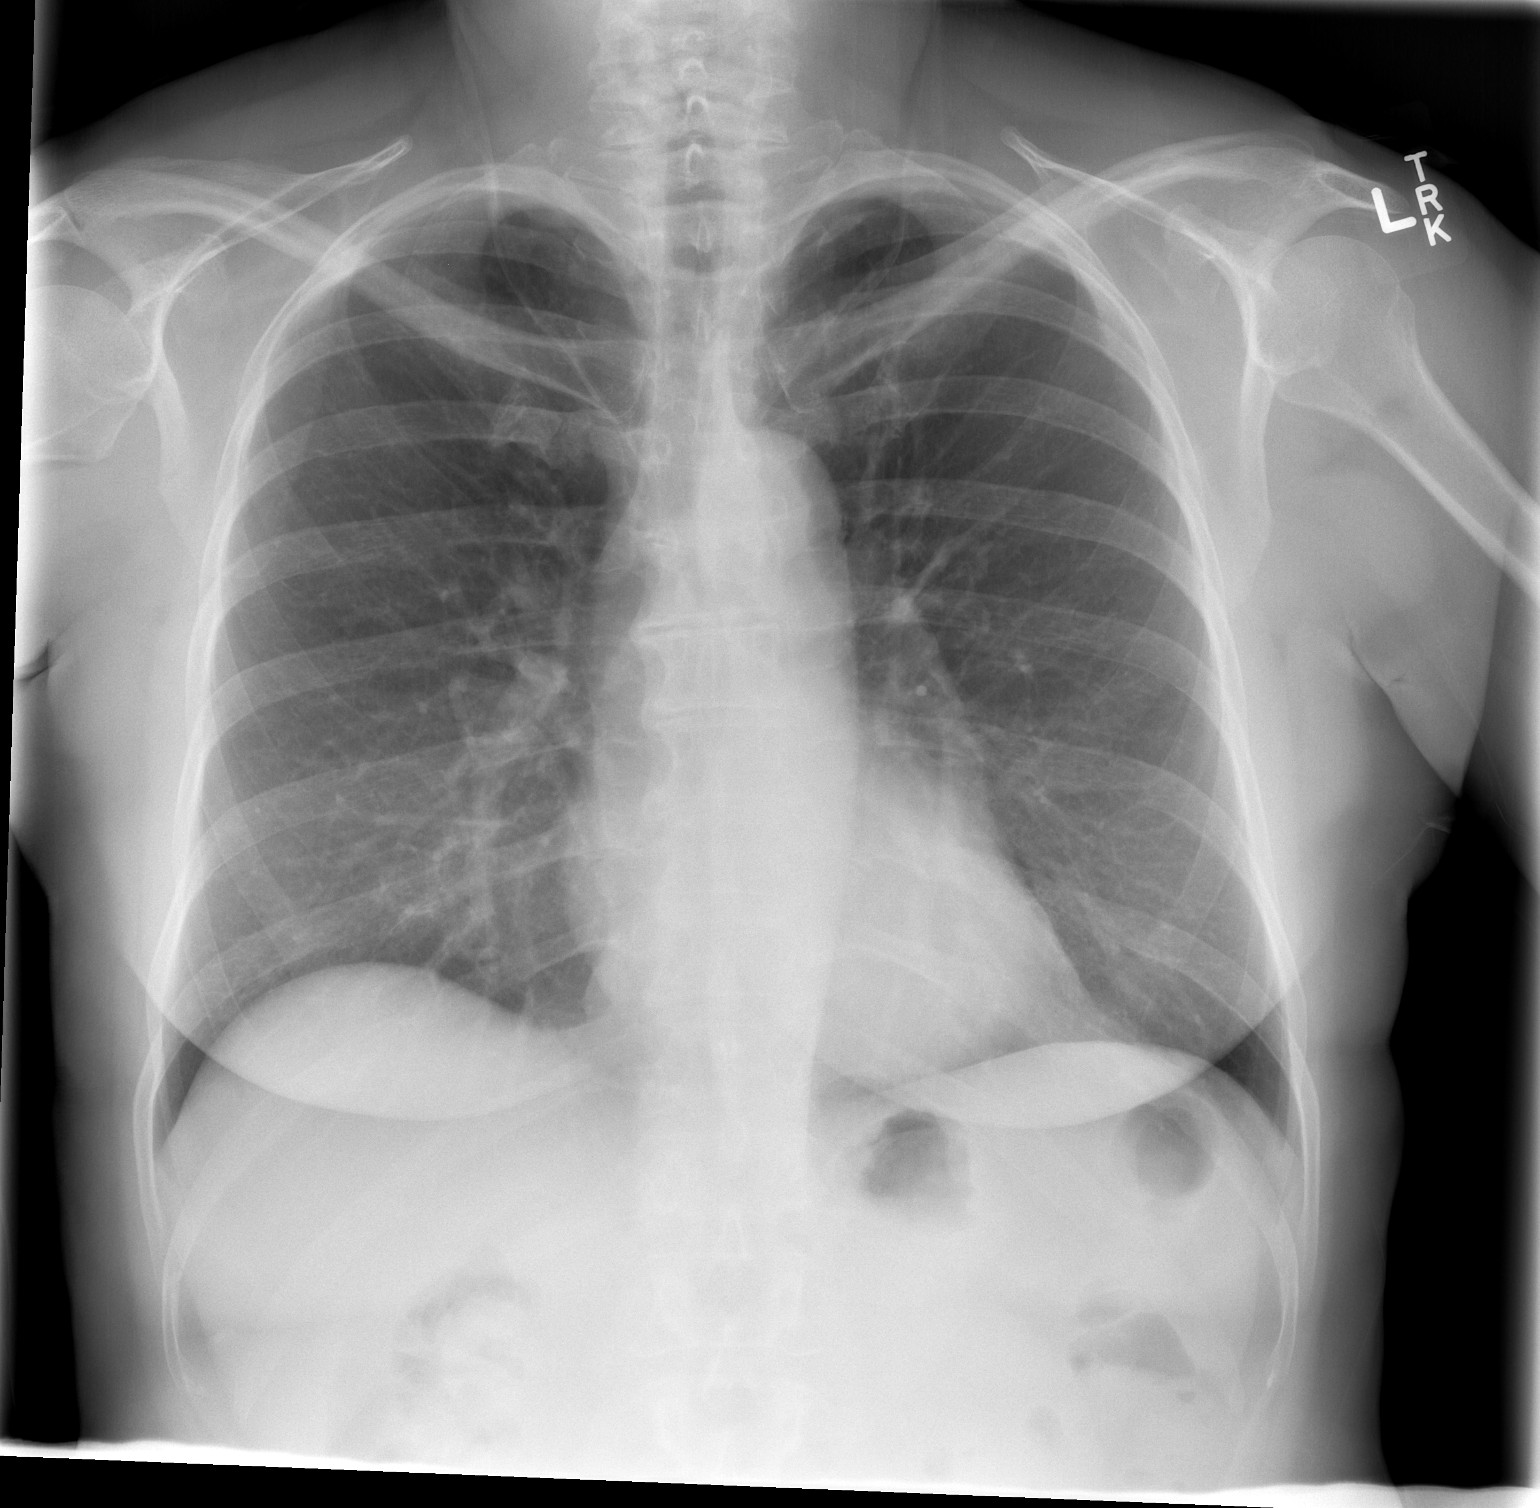

[w chest lat]
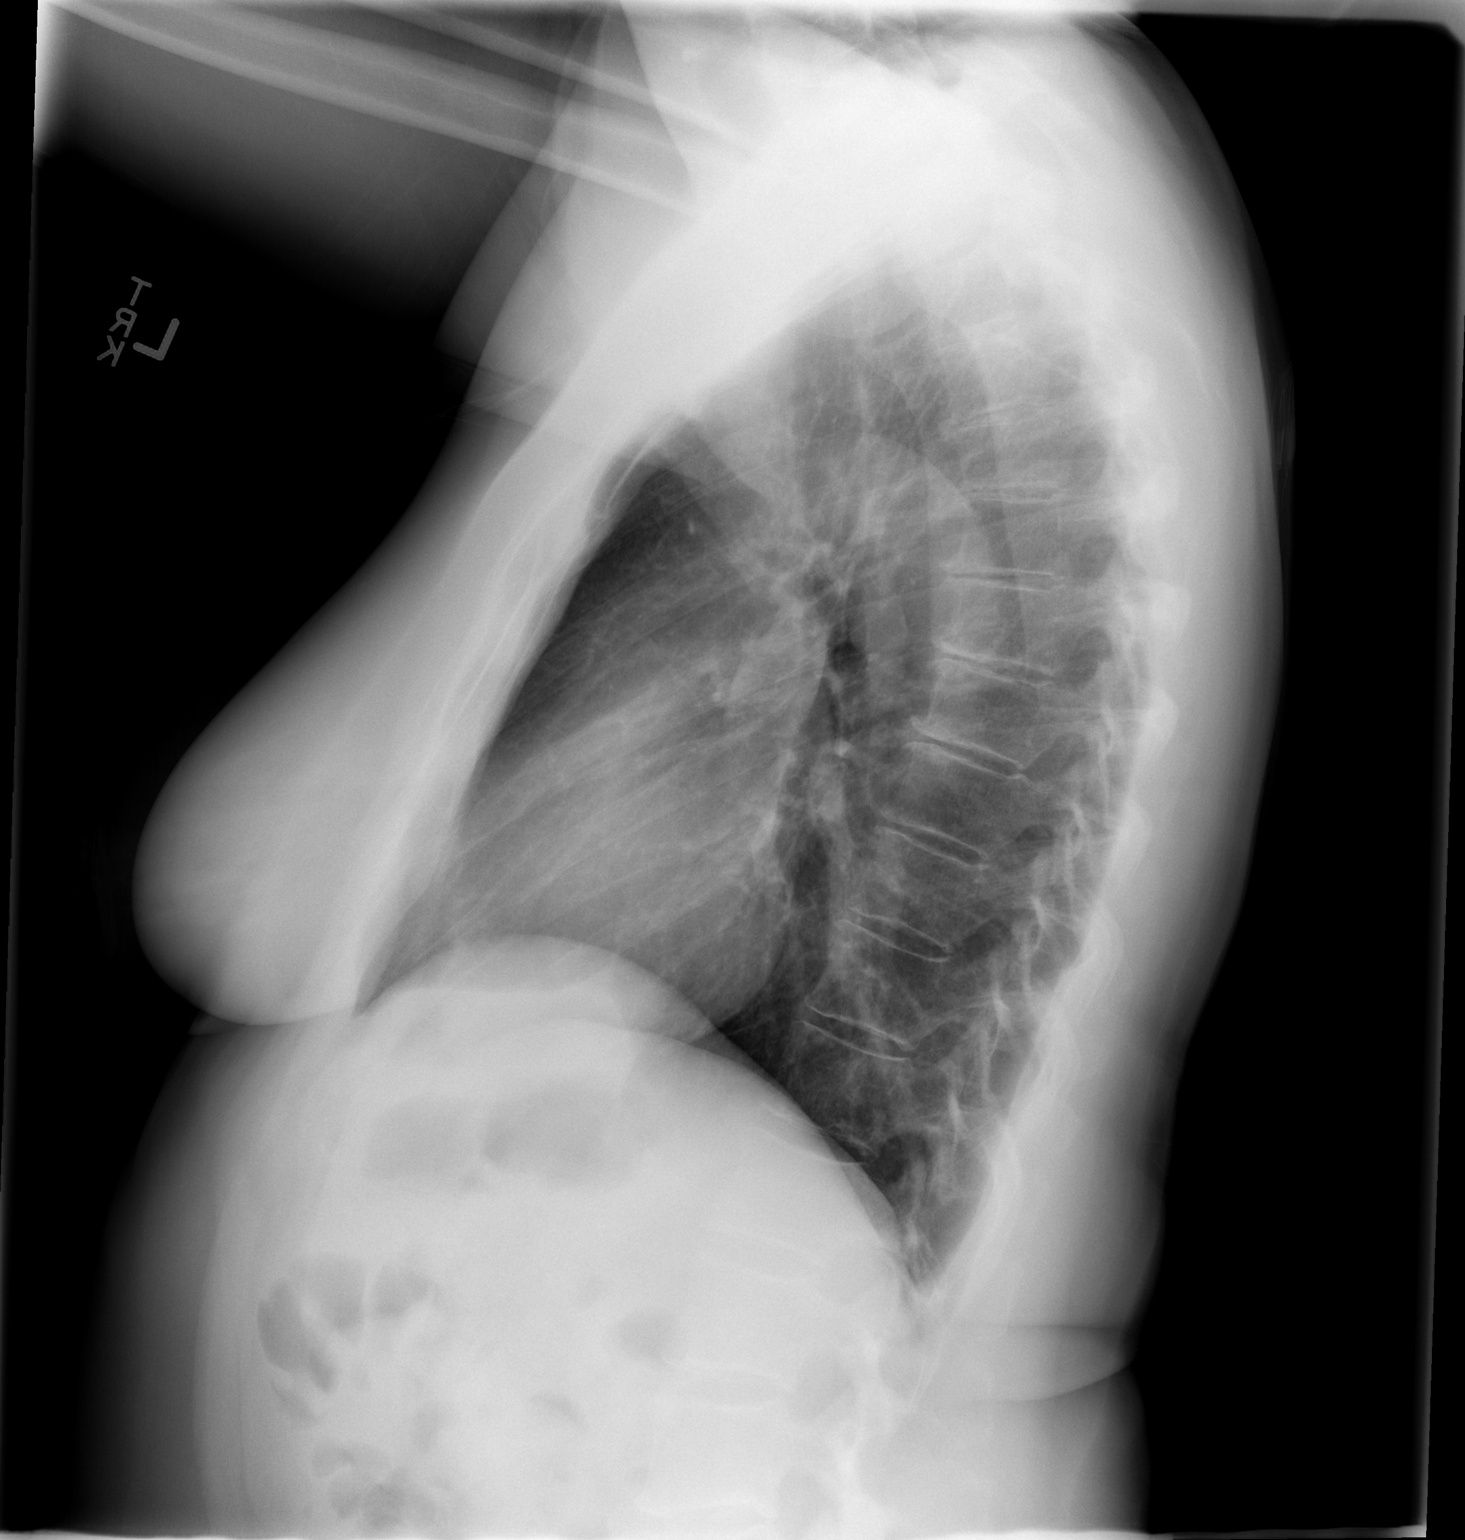

[2 of 2 positions shown; findings below may reference images not displayed]

FINDINGS: Cardiomediastinal silhouette appears normal.  No acute
pulmonary disease is noted.  Bony thorax is intact.
IMPRESSION: No acute cardiopulmonary abnormality seen.

## 2012-09-13 MED ORDER — IOHEXOL 300 MG/ML  SOLN
100.0000 mL | Freq: Once | INTRAMUSCULAR | Status: AC | PRN
  Administered 2012-09-13: 100 mL via INTRAVENOUS

## 2012-09-13 MED ORDER — PANTOPRAZOLE SODIUM 20 MG PO TBEC: 40.0000 mg | DELAYED_RELEASE_TABLET | Freq: Every day | ORAL | Status: DC

## 2012-09-13 MED ORDER — SODIUM CHLORIDE 0.9 % IV BOLUS (SEPSIS)
1000.0000 mL | Freq: Once | INTRAVENOUS | Status: AC
  Administered 2012-09-13: 1000 mL via INTRAVENOUS

## 2012-09-13 MED ORDER — PANTOPRAZOLE SODIUM 40 MG IV SOLR
40.0000 mg | Freq: Once | INTRAVENOUS | Status: AC
  Administered 2012-09-13: 40 mg via INTRAVENOUS
  Filled 2012-09-13: qty 40

## 2012-09-13 MED ORDER — ONDANSETRON HCL 4 MG/2ML IJ SOLN
4.0000 mg | Freq: Once | INTRAMUSCULAR | Status: AC
  Administered 2012-09-13: 4 mg via INTRAVENOUS
  Filled 2012-09-13: qty 2

## 2012-09-13 MED ORDER — FENTANYL CITRATE 0.05 MG/ML IJ SOLN
100.0000 ug | Freq: Once | INTRAMUSCULAR | Status: AC
  Administered 2012-09-13: 100 ug via INTRAVENOUS
  Filled 2012-09-13: qty 2

## 2012-09-13 MED ORDER — IOHEXOL 300 MG/ML  SOLN
25.0000 mL | INTRAMUSCULAR | Status: AC
  Administered 2012-09-13 (×2): 25 mL via ORAL

## 2012-09-13 MED ORDER — ONDANSETRON HCL 8 MG PO TABS: 8.0000 mg | ORAL_TABLET | ORAL | Status: DC | PRN

## 2012-09-13 NOTE — ED Notes (Signed)
Pt c/o left upper abdominal pain with nausea x 2 weeks. Pt reports doing a lot of belching.

## 2012-09-13 NOTE — ED Provider Notes (Signed)
History     CSN: [id number]  Arrival date & time 09/13/12  1022   First MD Initiated Contact with Patient 09/13/12 1141      Chief Complaint  Patient presents with  . Abdominal Pain    (Consider location/radiation/quality/duration/timing/severity/associated sxs/prior treatment) HPI..... nausea, burping, left upper quadrant discomfort intermittently for 2 weeks, worse with meals.  Severity is moderate. No radiation. No vomiting, diarrhea, fever, chills, chest pain, dyspnea, dysuria.   Pain is described as burning  Past Medical History  Diagnosis Date  . Hypertension   . Allergy     seasonal  . Diverticulosis 10/07/2010    Colonoscopy  . Arthritis   . Depression   . Hyperlipidemia   . Anxiety   . History of chicken pox   . Hemorrhoids     Past Surgical History  Procedure Laterality Date  . No past surgeries      Family History  Problem Relation Age of Onset  . Lung cancer Father   . Cancer Father     lung  . Lung cancer Paternal Grandfather   . Cancer Paternal Grandfather     lung  . Diabetes Mother   . Arthritis Mother   . Hyperlipidemia Mother   . Hypertension Mother   . Diabetes Maternal Grandmother   . Heart disease Maternal Grandmother   . Arthritis Maternal Grandmother   . Hyperlipidemia Maternal Grandmother   . Hypertension Maternal Grandmother   . Hyperlipidemia Sister   . Arthritis Paternal Grandmother   . Colon cancer Neg Hx     History  Substance Use Topics  . Smoking status: Never Smoker   . Smokeless tobacco: Never Used  . Alcohol Use: 0.6 oz/week    1 Glasses of wine per week     Comment: occasionally    OB History   Grav Para Term Preterm Abortions TAB SAB Ect Mult Living                  Review of Systems  All other systems reviewed and are negative.    Allergies  Amoxicillin  Home Medications   Current Outpatient Rx  Name  Route  Sig  Dispense  Refill  . bisoprolol-hydrochlorothiazide (ZIAC) 10-6.25 MG per tablet  Oral   Take 1 tablet by mouth daily.   90 tablet   3   . Calcium Carbonate (CALCIUM 600 PO)   Oral   Take 1,200 mg by mouth daily.         . Calcium Carbonate-Vitamin D (CALCIUM + D PO)   Oral   Take 1 tablet by mouth 2 (two) times daily. 1200mg   800 IU          . Cholecalciferol (VITAMIN D-3) 5000 UNITS TABS   Oral   Take 5,000 Units by mouth daily.          Marland Kitchen DHEA 10 MG CAPS   Oral   Take 10 capsules by mouth daily.           Marland Kitchen estradiol (ESTRACE) 0.1 MG/GM vaginal cream   Vaginal   Place 2 g vaginally 3 (three) times a week.         Marland Kitchen ibuprofen (ADVIL,MOTRIN) 200 MG tablet   Oral   Take 600 mg by mouth daily as needed. For pain         . magnesium gluconate (MAGONATE) 500 MG tablet   Oral   Take 500 mg by mouth daily.         Marland Kitchen  Omega-3 Fatty Acids (FISH OIL) 1200 MG CAPS   Oral   Take 1,200 mg by mouth daily.         . progesterone (PROMETRIUM) 100 MG capsule   Oral   Take 100 mg by mouth at bedtime.           . RED YEAST RICE EXTRACT PO   Oral   Take 4 tablets by mouth at bedtime.         . ondansetron (ZOFRAN) 8 MG tablet   Oral   Take 1 tablet (8 mg total) by mouth every 4 (four) hours as needed for nausea.   15 tablet   0   . pantoprazole (PROTONIX) 20 MG tablet   Oral   Take 2 tablets (40 mg total) by mouth daily.   30 tablet   1     BP 133/71  Pulse 85  Temp(Src) 98 F (36.7 C)  Resp 20  SpO2 100%  Physical Exam  Nursing note and vitals reviewed. Constitutional: She is oriented to person, place, and time. She appears well-developed and well-nourished.  HENT:  Head: Normocephalic and atraumatic.  Eyes: Conjunctivae and EOM are normal. Pupils are equal, round, and reactive to light.  Neck: Normal range of motion. Neck supple.  Cardiovascular: Normal rate, regular rhythm and normal heart sounds.   Pulmonary/Chest: Effort normal and breath sounds normal.  Abdominal: Soft. Bowel sounds are normal.  Minimal left upper  quadrant tenderness  Musculoskeletal: Normal range of motion.  Neurological: She is alert and oriented to person, place, and time.  Skin: Skin is warm and dry.  Psychiatric: She has a normal mood and affect.    ED Course  Procedures (including critical care time)  Labs Reviewed  CBC  BASIC METABOLIC PANEL  HEPATIC FUNCTION PANEL  AMYLASE  LIPASE, BLOOD  URINALYSIS, ROUTINE W REFLEX MICROSCOPIC  POCT I-STAT TROPONIN I   Dg Chest 2 View  09/13/2012  *RADIOLOGY REPORT*  Clinical Data: Chest pain  CHEST - 2 VIEW  Comparison: June 06, 2012.  Findings: Cardiomediastinal silhouette appears normal.  No acute pulmonary disease is noted.  Bony thorax is intact.  IMPRESSION: No acute cardiopulmonary abnormality seen.   Original Report Authenticated By: Lupita Raider.,  M.D.    Ct Abdomen Pelvis W Contrast  09/13/2012  *RADIOLOGY REPORT*  Clinical Data: Left upper quadrant tenderness  CT ABDOMEN AND PELVIS WITH CONTRAST  Technique:  Multidetector CT imaging of the abdomen and pelvis was performed following the standard protocol during bolus administration of intravenous contrast.  Contrast: OMNIPAQUE IOHEXOL 300 MG/ML  SOLN  Comparison: None.  Findings: Mild dependent atelectasis at the lung bases.  Mild diffuse hepatic steatosis.  Gallbladder, spleen, pancreas, adrenal glands, kidneys are within normal limits.  There is mild wall thickening of the lower esophagus.  No obvious hiatal hernia.  The left ovarian vein is prominent and opacifies with contrast. 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  Trace free fluid in the right hemi pelvis.  Bladder, uterus, and right adnexa are unremarkable.  No evidence of sigmoid diverticulitis.  Minimal diverticulosis of the descending colon is present.  IMPRESSION: Mild wall thickening of the lower esophagus.  The findings may be due to inflammatory process or edema related to reflux. Esophagram may be helpful.  Prominent left ovarian vein leading  to varices.  This may be due to venous insufficiency and can be associated with pelvic congestion syndrome.   Original Report Authenticated By: Merton Border  Hoss, M.D.      1. Esophagitis       MDM  CT scan reveals a distal esophagitis.   Also noted was a prominent left ovarian vein.  Rx Protonix 40 mg daily #30 with one refill and Zofran 8 mg #15.    Test results were discussed in great detail with patient and her family. She will follow up with her primary care physician. Also recommended gastroenterology followup for EGD        Donnetta Hutching, MD 09/13/12 1605

## 2012-09-13 NOTE — ED Notes (Signed)
Pt returned to xray 

## 2012-09-13 NOTE — ED Notes (Addendum)
Pt c/o upper left abd pain that began a few days ago after eating a salad. Pt states she began to burp a lot and about 2 days ago developed pain. Describes pain as pressure. Pt rates pain 2/10. Pain does not radiate. Denies any n/v/d.

## 2012-09-16 ENCOUNTER — Telehealth: Payer: Self-pay | Admitting: Internal Medicine

## 2012-09-16 NOTE — Telephone Encounter (Signed)
Spoke with patient and scheduled her to see Mike Gip, PA on 09/19/12 at 3:30 PM. (pt requested afternoon appointment)

## 2012-09-19 ENCOUNTER — Ambulatory Visit (INDEPENDENT_AMBULATORY_CARE_PROVIDER_SITE_OTHER): Payer: BC Managed Care – PPO | Admitting: Physician Assistant

## 2012-09-19 ENCOUNTER — Encounter: Payer: Self-pay | Admitting: Physician Assistant

## 2012-09-19 VITALS — BP 124/80 | HR 68 | Ht 63.5 in | Wt 143.6 lb

## 2012-09-19 DIAGNOSIS — K209 Esophagitis, unspecified without bleeding: Secondary | ICD-10-CM | POA: Insufficient documentation

## 2012-09-19 DIAGNOSIS — R935 Abnormal findings on diagnostic imaging of other abdominal regions, including retroperitoneum: Secondary | ICD-10-CM

## 2012-09-19 DIAGNOSIS — K219 Gastro-esophageal reflux disease without esophagitis: Secondary | ICD-10-CM

## 2012-09-19 MED ORDER — PANTOPRAZOLE SODIUM 40 MG PO TBEC: 40.0000 mg | DELAYED_RELEASE_TABLET | Freq: Every day | ORAL | Status: DC

## 2012-09-19 NOTE — Patient Instructions (Signed)
We sent refills on the Pantoprazole Sodium ( Protonix ) prescription.  [facility] [address] We  have given you information on reflux.   You have been scheduled for an endoscopy with propofol. Please follow written instructions given to you at your visit today. If you use inhalers (even only as needed), please bring them with you on the day of your procedure. Your physician has requested that you go to www.startemmi.com and enter the access code given to you at your visit today. This web site gives a general overview about your procedure. However, you should still follow specific instructions given to you by our office regarding your preparation for the procedure.

## 2012-09-19 NOTE — Progress Notes (Signed)
Reviewed and agree.

## 2012-09-19 NOTE — Progress Notes (Signed)
Subjective:    Patient ID: Terri Gay, female    DOB: 01-Sep-1950, 62 y.o.   MRN: [id number]  HPI Shakedra is a pleasant 62 -year-old female known to Dr. [name]. She has been seen in the past 4 colonoscopy which was last done in May of 2012 and positive for moderate diverticulosis. She has also had problems with hemorrhoids. She comes in today after an emergency room visit on 09/13/2012 with complaints of nausea epigastric and left upper quadrant pain belching and burping . She says her symptoms have been going on for about 2 weeks and she got worried. Workup there with CT scan of the abdomen and pelvis showed mild wall thickening in the lower esophagus and a prominent left ovarian vein consistent with a varix which can be associated with pelvic congestion syndrome. Labs were unremarkable.Marland Kitchen She was started on a course of Protonix 40 mg daily and asked to follow up with GI. She says she is feeling significantly better at this point and the most of her symptoms have resolved . She currently denies any dysphagia or odynophagia. Her appetite is fine, her weight has been stable. She says she has very mild residual soreness very high in her epigastrium. She says she had been having symptoms for a couple of weeks with an annoying type of discomfort or pain in her epigastrium. She says it was bothering her now that she really didn't want to keep because that tended at graded. Along with this she was having continuous burping and belching and a turning feeling in her epigastrium. Did not have any radiation to her chest or her back no diarrhea or melena and no vomiting. She had not been on any regular NSAIDs recently no recent antibiotics or other different medications or supplements . She says that she had been put on omeprazole back in the fall by her primary care provider because she was having complaints of indigestion. She says she took the medicine for about 3 months and then stopped because she felt a lot  better.    Review of Systems  Constitutional: Positive for appetite change.  HENT: Negative.   Eyes: Negative.   Respiratory: Negative.   Cardiovascular: Negative.   Gastrointestinal: Positive for abdominal pain.  Endocrine: Negative.   Genitourinary: Negative.   Allergic/Immunologic: Negative.   Neurological: Negative.   Hematological: Negative.   Psychiatric/Behavioral: Negative.    Outpatient Prescriptions Prior to Visit  Medication Sig Dispense Refill  . bisoprolol-hydrochlorothiazide (ZIAC) 10-6.25 MG per tablet Take 1 tablet by mouth daily.  90 tablet  3  . Calcium Carbonate (CALCIUM 600 PO) Take 1,200 mg by mouth daily.      . Calcium Carbonate-Vitamin D (CALCIUM + D PO) Take 1 tablet by mouth 2 (two) times daily. 1200mg   800 IU       . Cholecalciferol (VITAMIN D-3) 5000 UNITS TABS Take 5,000 Units by mouth daily.       Marland Kitchen DHEA 10 MG CAPS Take 1 capsule by mouth daily.       Marland Kitchen estradiol (ESTRACE) 0.1 MG/GM vaginal cream Place 2 g vaginally 3 (three) times a week.      Marland Kitchen ibuprofen (ADVIL,MOTRIN) 200 MG tablet Take 600 mg by mouth daily as needed. For pain      . magnesium gluconate (MAGONATE) 500 MG tablet Take 500 mg by mouth daily.      . Omega-3 Fatty Acids (FISH OIL) 1200 MG CAPS Take 1,200 mg by mouth daily.      Marland Kitchen  progesterone (PROMETRIUM) 100 MG capsule Take 100 mg by mouth at bedtime.        . RED YEAST RICE EXTRACT PO Take 4 tablets by mouth at bedtime.      . pantoprazole (PROTONIX) 20 MG tablet Take 2 tablets (40 mg total) by mouth daily.  30 tablet  1  . ondansetron (ZOFRAN) 8 MG tablet Take 1 tablet (8 mg total) by mouth every 4 (four) hours as needed for nausea.  15 tablet  0   Facility-Administered Medications Prior to Visit  Medication Dose Route Frequency Provider Last Rate Last Dose  . 0.9 %  sodium chloride infusion  500 mL Intravenous Continuous Hart Carwin, MD          Allergies  Allergen Reactions  . Amoxicillin Diarrhea   Patient Active Problem  List   Diagnosis Date Noted  . Acute esophagitis 09/19/2012  . GERD (gastroesophageal reflux disease) 03/03/2012  . HTN (hypertension) 08/23/2011  . Rectal bleeding 06/23/2011  . Hyperlipidemia 03/02/2011  . Depression with anxiety 01/26/2011  . Insomnia 01/26/2011  . Diverticulosis 01/17/2011  . Hemorrhoids 01/17/2011  . Osteoarthritis 01/17/2011   History  Substance Use Topics  . Smoking status: Never Smoker   . Smokeless tobacco: Never Used  . Alcohol Use: 0.6 oz/week    1 Glasses of wine per week     Comment: occasionally   family history includes Arthritis in her maternal grandmother, mother, and paternal grandmother; Cancer in her father and paternal grandfather; Diabetes in her maternal grandmother and mother; Heart disease in her maternal grandmother; Hyperlipidemia in her maternal grandmother, mother, and sister; Hypertension in her maternal grandmother and mother; and Lung cancer in her father and paternal grandfather.  There is no history of Colon cancer.  Objective:   Physical Exam   developed white female in no acute distress, pleasant blood pressure 124/80 pulse 58 height 5 foot 3 weight 143. HEENT; nontraumatic normocephalic EOMI PERRLA sclera anicteric,Neck; Supple no JVD, Cardiovascular; regular rate and rhythm with S1-S2 no murmur or gallop, Pulmonary clear bilaterally, Abdomen; is soft minimally tender in the subxiphoid and epigastrium no guarding or rebound no palpable mass or hepatomegaly bowel sounds are active, Rectal exam not done, Extremities ;no clubbing cyanosis or edema skin warm and dry, Psych; mood and affect normal and appropriate.        Assessment & Plan:  #27 62 year old female with acute esophagitis-symptomatically improved on protonix. Probable underlying chronic GERD #2 abnormal CT scan with thickened distal esophagus-most likely secondary to acute esophagitis will rule out occult lesion #3 diverticulosis  Plan; Continue Protonix 40 mg by mouth  daily-will send refills. Patient has been reading about potential side effects of long-term PPI therapy. Have suggested she stay on Protonix 40 mg by mouth daily for at least 2 months and then as long as her endoscopy is okay she may switch to Pepcid 40 mg by mouth daily. Antireflux regimen was reviewed and educational information provided Will schedule for upper endoscopy with Dr. Hermelinda Medicus was discussed in detail the patient is agreeable to proceed.

## 2012-09-20 ENCOUNTER — Encounter: Payer: Self-pay | Admitting: Internal Medicine

## 2012-09-27 ENCOUNTER — Ambulatory Visit (INDEPENDENT_AMBULATORY_CARE_PROVIDER_SITE_OTHER): Payer: BC Managed Care – PPO | Admitting: Family Medicine

## 2012-09-27 ENCOUNTER — Encounter: Payer: Self-pay | Admitting: Family Medicine

## 2012-09-27 VITALS — BP 120/70 | HR 69 | Temp 98.1°F | Ht 63.5 in | Wt 141.0 lb

## 2012-09-27 DIAGNOSIS — R1013 Epigastric pain: Secondary | ICD-10-CM

## 2012-09-27 DIAGNOSIS — K219 Gastro-esophageal reflux disease without esophagitis: Secondary | ICD-10-CM

## 2012-09-27 DIAGNOSIS — I10 Essential (primary) hypertension: Secondary | ICD-10-CM

## 2012-09-27 DIAGNOSIS — E785 Hyperlipidemia, unspecified: Secondary | ICD-10-CM

## 2012-09-27 MED ORDER — BISOPROLOL-HYDROCHLOROTHIAZIDE 10-6.25 MG PO TABS: 1.0000 | ORAL_TABLET | Freq: Every day | ORAL | Status: DC

## 2012-09-27 MED ORDER — TRETINOIN (FACIAL WRINKLES) 0.05 % EX CREA: 1.0000 [drp] | TOPICAL_CREAM | Freq: Every day | CUTANEOUS | Status: DC

## 2012-09-27 NOTE — Patient Instructions (Addendum)
Digestive Advantage probiotic daily by [facility] Next visit annual with labs prior to visit, lipid, renal, cbc, tsh, hepatic  Helicobacter Pylori and Ulcer Disease An ulcer may be in your stomach (gastric ulcer) or in the first part of your small bowel, which is called the duodenum (duodenal ulcer). An ulcer is a break in the stomach or duodenum lining. The break wears down into the deeper tissue. Helicobacter pylori (H. pylori) is a type of germ (bacteria) that may cause the majority of gastric or duodenal ulcers. CAUSES   A germ (bacterium). H. pylori can weaken the protective mucous coating of the stomach and duodenum. This allows acid to get through to the sensitive lining of the stomach or duodenum and an ulcer can then form.  Certain medications.  Using substances that can bother the lining of the stomach (alcohol, tobacco or medications such as Advil or Motrin) in the presence of H.pylori infection. This can increase the chances of getting an ulcer.  Cancer (rarely). Most people infected with H. pylori do not get ulcers. It is not known how people catch H. pylori. It may be through food or water. H. pylori has been found in the saliva of some infected people. Therefore, the bacteria may also spread through mouth-to-mouth contact such as kissing. SYMPTOMS  The problems (symptoms) of ulcer disease are usually:  A burning or gnawing of the mid-upper belly (abdomen). This is often worse on an empty stomach. It may get better with food. This may be associated with feeling sick to your stomach (nausea), bloating and vomiting.  If the ulcer results in bleeding, it can cause:  Black, tarry stools.  Throwing up bright red blood.  Throwing up coffee ground looking materials. With severe bleeding, there may be loss of consciousness and shock. Besides ulcer disease, H. pylori can also cause chronic gastritis (irritation of the lining of the stomach without ulcer) or stomach acid-type discomfort.  You may not have symptoms even though you have an H. pylori infection. Although this is an infection, you may not have usual infection symptoms (such as fever). DIAGNOSIS  Ulcer disease can be diagnosed in many different ways. If you have an ulcer, it is important to know whether or not it is caused by H. Pylori. Treatment for an ulcer caused by H. pylori is different from that for an ulcer with other causes. The best way to detect H. pylori is taking tissue directly from the ulcer during an endoscopy test.   An endoscopy is an exam that uses an endoscope. This is a thin, lighted tube with a small camera on the end. It is like a flexible telescope. The patient is given a drug to make them calm (sedative). The caregiver eases the endoscope into the mouth and down the throat to the stomach and duodenum. This allows the doctor to see the lining of the esophagus, stomach and duodenum.  If an endoscopy is not needed, then H. pylori can be detected with tests of the blood, stool or even breath. TREATMENT   H. pylori peptic ulcer treatment usually involves a combination of:  Medicines that kill germs (antibiotics).  Acid suppressors.  Stomach protectors.  The use of only one medication to treat H. pylori is not recommended. The most proven treatment is a 2 week course of treatment called triple therapy. It involves taking two antibiotics to kill the bacteria and either an acid suppressor or stomach-lining shield. Two-week triple therapy reduces ulcer symptoms, kills the bacteria, and prevents ulcers  from coming back in many patients.  Unfortunately, patients may find triple therapy hard to do. This is because it involves taking as many as 20 pills a day. Also, the antibiotics used in triple therapy may cause mild side effects. These include nausea, vomiting, diarrhea, dark stools, a metallic taste in the mouth, dizziness, headache and yeast infections in women. Talk to your caregiver if you have any of  these side effects. HOME CARE INSTRUCTIONS   Take your medications as directed and for as long as prescribed. Contact your caregiver if you have problems or side effects from your medications.  Continue regular work and usual activities unless told otherwise by your caregiver.  Avoid tobacco, alcohol and caffeine. Tobacco use will decrease and slow healing.  Avoid medications that are harmful. This includes aspirin and NSAIDS such as ibuprofen and naproxen.  Avoid foods that seem to aggravate or cause discomfort.  There are many over-the-counter products available to control stomach acid and other symptoms. Discuss these with your caregiver before using them. Do not  stop taking prescription medications for over-the-counter medications without talking with your caregiver.  Special diets are not usually needed.  Keep any follow-up appointments and blood tests as directed. SEEK MEDICAL CARE IF:   Your pain or other ulcer symptoms do not improve within a few days of starting treatment.  You develop diarrhea. This can be a problem related to certain treatments.  You have ongoing indigestion or heartburn even if your main ulcer symptoms are improved.  You think you have any side effects from your medications or if you do not understand how to use your medications right. SEEK IMMEDIATE MEDICAL CARE IF:  Any of the following happen:  You develop bright red, rectal bleeding.  You develop dark black, tarry stools.  You throw up (vomit) blood.  You become light-headed, weak, have fainting episodes, or become sweaty, cold and clammy.  You have severe abdominal pain not controlled by medications. Do not take pain medications unless ordered by your caregiver. MAKE SURE YOU:   Understand these instructions.  Will watch your condition.  Will get help right away if you are not doing well or get worse. Document Released: 07/29/2003 Document Revised: 07/31/2011 Document Reviewed:  12/26/2007 [facility] Patient Information 2013 [place], [place].

## 2012-09-29 ENCOUNTER — Encounter: Payer: Self-pay | Admitting: Family Medicine

## 2012-09-29 NOTE — Progress Notes (Signed)
Patient ID: Terri Gay, female   DOB: 08-07-1950, 62 y.o.   MRN: [id number] PRINCETTA UPLINGER [id number] 03/30/1951 09/29/2012      Progress Note-Follow Up  Subjective  Chief Complaint  Chief Complaint  Patient presents with  . Follow-up    med refills    HPI  Patient is a 62 year old Caucasian female who is in today for followup. Unfortunately she recently had a trip to the emergency room with left upper quadrant pain. CT scan was negative for any cause. Proton pump inhibitor was switched she does note she is somewhat better with the switch to Protonix. Unfortunately continues to have some mild burning and significant belching. No fevers or chills. No constipation or diarrhea. No chest pain per se. No shortness of breath, palpitations, fevers or congestion. No GU complaints and no other acute concerns.  Past Medical History  Diagnosis Date  . Hypertension   . Allergy     seasonal  . Diverticulosis 10/07/2010    Colonoscopy  . Arthritis   . Depression   . Hyperlipidemia   . Anxiety   . History of chicken pox   . Hemorrhoids     Past Surgical History  Procedure Laterality Date  . No past surgeries      Family History  Problem Relation Age of Onset  . Lung cancer Father   . Cancer Father     lung  . Lung cancer Paternal Grandfather   . Cancer Paternal Grandfather     lung  . Diabetes Mother   . Arthritis Mother   . Hyperlipidemia Mother   . Hypertension Mother   . Diabetes Maternal Grandmother   . Heart disease Maternal Grandmother   . Arthritis Maternal Grandmother   . Hyperlipidemia Maternal Grandmother   . Hypertension Maternal Grandmother   . Hyperlipidemia Sister   . Arthritis Paternal Grandmother   . Colon cancer Neg Hx     History   Social History  . Marital Status: Married    Spouse Name: N/A    Number of Children: 2  . Years of Education: N/A   Occupational History  . Customer Service    Social History Main Topics  . Smoking status: Never Smoker    . Smokeless tobacco: Never Used  . Alcohol Use: 0.6 oz/week    1 Glasses of wine per week     Comment: occasionally  . Drug Use: No  . Sexually Active: Not on file   Other Topics Concern  . Not on file   Social History Narrative   Caffeine Use: 1 cup coffee and 1 cup tea          Current Outpatient Prescriptions on File Prior to Visit  Medication Sig Dispense Refill  . Calcium Carbonate (CALCIUM 600 PO) Take 1,200 mg by mouth daily.      . Cholecalciferol (VITAMIN D-3) 5000 UNITS TABS Take 5,000 Units by mouth daily.       Marland Kitchen DHEA 10 MG CAPS Take 1 capsule by mouth daily.       Marland Kitchen estradiol (ESTRACE) 0.1 MG/GM vaginal cream Place 2 g vaginally 3 (three) times a week.      Marland Kitchen ibuprofen (ADVIL,MOTRIN) 200 MG tablet Take 600 mg by mouth daily as needed. For pain      . magnesium gluconate (MAGONATE) 500 MG tablet Take 500 mg by mouth daily.      . Omega-3 Fatty Acids (FISH OIL) 1200 MG CAPS Take 1,200 mg by mouth daily.      Marland Kitchen  pantoprazole (PROTONIX) 40 MG tablet Take 1 tablet (40 mg total) by mouth daily.  90 tablet  3  . Probiotic Product (PROBIOTIC DAILY) CAPS Take 1 capsule by mouth daily.      . progesterone (PROMETRIUM) 100 MG capsule Take 100 mg by mouth at bedtime.        . RED YEAST RICE EXTRACT PO Take 4 tablets by mouth at bedtime.       Current Facility-Administered Medications on File Prior to Visit  Medication Dose Route Frequency Provider Last Rate Last Dose  . 0.9 %  sodium chloride infusion  500 mL Intravenous Continuous Hart Carwin, MD        Allergies  Allergen Reactions  . Amoxicillin Diarrhea    Review of Systems  Review of Systems  Constitutional: Negative for fever and malaise/fatigue.  HENT: Negative for congestion.   Eyes: Negative for discharge.  Respiratory: Negative for shortness of breath.   Cardiovascular: Negative for chest pain, palpitations and leg swelling.  Gastrointestinal: Positive for heartburn and abdominal pain. Negative for  nausea and diarrhea.  Genitourinary: Negative for dysuria.  Musculoskeletal: Negative for falls.  Skin: Negative for rash.  Neurological: Negative for loss of consciousness and headaches.  Endo/Heme/Allergies: Negative for polydipsia.  Psychiatric/Behavioral: Negative for depression and suicidal ideas. The patient is not nervous/anxious and does not have insomnia.     Objective  BP 120/70  Pulse 69  Temp(Src) 98.1 F (36.7 C) (Oral)  Ht 5' 3.5" (1.613 m)  Wt 141 lb 0.6 oz (63.975 kg)  BMI 24.59 kg/m2  SpO2 95%  Physical Exam  Physical Exam  Constitutional: She is oriented to person, place, and time and well-developed, well-nourished, and in no distress. No distress.  HENT:  Head: Normocephalic and atraumatic.  Eyes: Conjunctivae are normal.  Neck: Neck supple. No thyromegaly present.  Cardiovascular: Normal rate, regular rhythm and normal heart sounds.   No murmur heard. Pulmonary/Chest: Effort normal and breath sounds normal. She has no wheezes.  Abdominal: She exhibits no distension and no mass.  Musculoskeletal: She exhibits no edema.  Lymphadenopathy:    She has no cervical adenopathy.  Neurological: She is alert and oriented to person, place, and time.  Skin: Skin is warm and dry. No rash noted. She is not diaphoretic.  Psychiatric: Memory, affect and judgment normal.    Lab Results  Component Value Date   TSH 0.818 09/29/2010   Lab Results  Component Value Date   WBC 9.1 09/13/2012   HGB 13.2 09/13/2012   HCT 39.1 09/13/2012   MCV 85.2 09/13/2012   PLT 192 09/13/2012   Lab Results  Component Value Date   CREATININE 0.73 09/13/2012   BUN 11 09/13/2012   NA 140 09/13/2012   K 4.3 09/13/2012   CL 102 09/13/2012   CO2 29 09/13/2012   Lab Results  Component Value Date   ALT 17 09/13/2012   AST 21 09/13/2012   ALKPHOS 65 09/13/2012   BILITOT 0.3 09/13/2012   Lab Results  Component Value Date   CHOL 228* 02/29/2012   Lab Results  Component Value Date   HDL 56  02/29/2012   Lab Results  Component Value Date   LDLCALC 156* 02/29/2012   Lab Results  Component Value Date   TRIG 81 02/29/2012   Lab Results  Component Value Date   CHOLHDL 4.1 02/29/2012     Assessment & Plan  GERD (gastroesophageal reflux disease) Had a recent flare that required a trip to  the ER, has now established with GI. Can add some Zantac in pm if needed, continue probiotics and avoid offending foods. LUQ pain has improved some  HTN (hypertension) Well controlled no changes.  Hyperlipidemia Mild, encouraged avoid trans fats, increase exercise, consider adding krill oil minimize simple carbs and saturated fats.

## 2012-09-29 NOTE — Assessment & Plan Note (Addendum)
Had a recent flare that required a trip to the ER, has now established with GI. Can add some Zantac in pm if needed, continue probiotics and avoid offending foods. LUQ pain has improved some

## 2012-09-29 NOTE — Assessment & Plan Note (Signed)
Mild, encouraged avoid trans fats, increase exercise, consider adding krill oil minimize simple carbs and saturated fats.

## 2012-09-29 NOTE — Assessment & Plan Note (Signed)
Well controlled no changes 

## 2012-09-30 LAB — H. PYLORI ANTIBODY, IGG: H Pylori IgG: <0.4 {ISR}

## 2012-10-08 ENCOUNTER — Encounter: Payer: Self-pay | Admitting: Internal Medicine

## 2012-10-08 ENCOUNTER — Ambulatory Visit (AMBULATORY_SURGERY_CENTER): Payer: BC Managed Care – PPO | Admitting: Internal Medicine

## 2012-10-08 VITALS — BP 142/78 | HR 73 | Temp 99.0°F | Resp 17 | Ht 63.5 in | Wt 143.0 lb

## 2012-10-08 DIAGNOSIS — K2970 Gastritis, unspecified, without bleeding: Secondary | ICD-10-CM

## 2012-10-08 DIAGNOSIS — R935 Abnormal findings on diagnostic imaging of other abdominal regions, including retroperitoneum: Secondary | ICD-10-CM

## 2012-10-08 DIAGNOSIS — K2990 Gastroduodenitis, unspecified, without bleeding: Secondary | ICD-10-CM

## 2012-10-08 DIAGNOSIS — K209 Esophagitis, unspecified without bleeding: Secondary | ICD-10-CM

## 2012-10-08 DIAGNOSIS — D1330 Benign neoplasm of unspecified part of small intestine: Secondary | ICD-10-CM

## 2012-10-08 DIAGNOSIS — K219 Gastro-esophageal reflux disease without esophagitis: Secondary | ICD-10-CM

## 2012-10-08 MED ORDER — SODIUM CHLORIDE 0.9 % IV SOLN: 500.0000 mL | INTRAVENOUS | Status: DC

## 2012-10-08 NOTE — Progress Notes (Signed)
Patient did not experience any of the following events: a burn prior to discharge; a fall within the facility; wrong site/side/patient/procedure/implant event; or a hospital transfer or hospital admission upon discharge from the facility. (G8907) Patient did not have preoperative order for IV antibiotic SSI prophylaxis. (G8918)  

## 2012-10-08 NOTE — Progress Notes (Signed)
Called to room to assist during endoscopic procedure.  Patient ID and intended procedure confirmed with present staff. Received instructions for my participation in the procedure from the performing physician.  

## 2012-10-08 NOTE — Progress Notes (Signed)
The pt is very anxious about the procedure and the administration of propofol.  I answered her questions and tried to comfort her. Maw

## 2012-10-08 NOTE — Op Note (Signed)
[facility] [address] [place], [address]   ENDOSCOPY PROCEDURE REPORT  PATIENT: Terri Gay, Terri Gay  MR#: [id number] BIRTHDATE: 1951-02-17 , 62  yrs. old GENDER: Female ENDOSCOPIST: Hart Carwin, MD REFERRED BY:  Charlynn Court, M.D. PROCEDURE DATE:  10/08/2012 PROCEDURE:  EGD w/ biopsy ASA CLASS:     Class II INDICATIONS:  Epigastric pain.   abnormal CT of the GI tract. c/w esophaggitis vs thickened distal esophagus, improved on Protonix MEDICATIONS: MAC sedation, administered by CRNA and propofol (Diprivan) 200mg  IV TOPICAL ANESTHETIC: Cetacaine Spray  DESCRIPTION OF PROCEDURE: After the risks benefits and alternatives of the procedure were thoroughly explained, informed consent was obtained.  The LB [id number] endoscope was introduced through the mouth and advanced to the second portion of the duodenum. Without limitations.  The instrument was slowly withdrawn as the mucosa was fully examined.      E[sophagus: Endoscope passed under direct vision through the posterior pharynx into the esophagus. Esophageal mucosa appeared normal throughout the proximal mid and distal esophagus. The Z line was regular but showed mild fibrosis. There was no definite stricture. Biopsies were taken from GE junction to rule out chronic esophagitis  Stomach: Stomach was insufflated with air and showed small 1 cm hiatal hernia which was easily reducible. Gastric folds were normal. It was several small fundic gland polyps in the gastric body. Gastric antrum showed minimal erythema. Biopsies were obtained to rule out H. pylori. Pyloric outlet was normal  Duodenum duodenal bulb and descending duodenum appeared normal with mild irregularity of the villous pattern but no definite scalloping. Biopsies were taken to rule out villous atrophy The scope was then withdrawn from the patient and the procedure completed.  COMPLICATIONS: There were no complications. ENDOSCOPIC  IMPRESSION:  small reducible hiatal hernia Fundic gland polyps Status post biopsies from the GE junction, from gastric antrum and from the descending duodenum CT scan abnormality likely caused by small hiatal hernia RECOMMENDATIONS: 1.  Await pathology results 2.  Anti-reflux regimen to be follow 3.  Continue PPI symptoms likely related to small hiatal hernia  REPEAT EXAM: no  eSigned:  Hart Carwin, MD 10/08/2012 8:58 AM   CC:  PATIENT NAME:  Terri Gay, Terri Gay MR#: [id number]

## 2012-10-08 NOTE — Patient Instructions (Addendum)

## 2012-10-09 ENCOUNTER — Telehealth: Payer: Self-pay | Admitting: *Deleted

## 2012-10-09 NOTE — Telephone Encounter (Signed)
No answer, message left for the patient. 

## 2012-10-16 ENCOUNTER — Encounter: Payer: Self-pay | Admitting: Internal Medicine

## 2012-11-13 ENCOUNTER — Telehealth: Payer: Self-pay

## 2012-11-13 MED ORDER — ALPRAZOLAM 0.5 MG PO TABS: 0.5000 mg | ORAL_TABLET | Freq: Every evening | ORAL | Status: DC | PRN

## 2012-11-13 NOTE — Telephone Encounter (Signed)
Sure, I have seen her OK to write Alprazolam same strength, same sig #30

## 2012-11-13 NOTE — Telephone Encounter (Signed)
RX faxed  Pt informed and voiced understanding

## 2012-11-13 NOTE — Telephone Encounter (Signed)
Pt left a message that Dr Rodena Medin had wrote her an RX for Lorazepam for her anxiety and she would like to know if Dr Abner Greenspan could refill this for her?  Please advise? Last RX was wrote on 01-30-11 quantity 30 with 0 refills  Pt would like a message left on her cell phone if she doesn't answer [phone]

## 2012-12-11 ENCOUNTER — Other Ambulatory Visit: Payer: Self-pay | Admitting: Nurse Practitioner

## 2012-12-12 NOTE — Telephone Encounter (Signed)
Sent to me in error. 

## 2012-12-16 ENCOUNTER — Telehealth: Payer: Self-pay | Admitting: *Deleted

## 2012-12-17 ENCOUNTER — Telehealth: Payer: Self-pay | Admitting: *Deleted

## 2012-12-17 ENCOUNTER — Other Ambulatory Visit: Payer: Self-pay | Admitting: *Deleted

## 2012-12-17 MED ORDER — HYDROCORTISONE ACETATE 25 MG RE SUPP: RECTAL | Status: DC

## 2012-12-17 NOTE — Telephone Encounter (Signed)
Called to tell the patient that per Gunnar Fusi, I can send refill for the suppositories.  I asked her if she was having any bleeding and she said not right now but now and then her internal hemorrhoids act up .  She said she saw [facility] and the doctor there said he can band her internal hemorrhoids . She is to call them when she wants to do that.  I told her I would call [facility] and keep the prescription on hold.  They said they could do that.

## 2012-12-17 NOTE — Telephone Encounter (Signed)
Message copied by Derry Skill on Tue Dec 17, 2012  4:55 PM ------      Message from: Meredith Pel      Created: Tue Dec 17, 2012 11:59 AM      Regarding: RE: Prescription request       Arpan Eskelson, her hemorrhoids seem to be bleeding a lot. We can refill supp but please make her an appt with Brodie to discuss potential banding. Thanks      ----- Message -----         From: Derry Skill, CMA         Sent: 12/16/2012   4:40 PM           To: Meredith Pel, NP      Subject: Prescription request                                     I got a request for Hydorcort AC 25 mg suppositories for this pt from [facility].      This isn't even on her current med list.       You saw the patient 06-22-2011.  Amy saw this patient 09-19-2012.       This is a Dr. Juanda Chance patient.             Terri Gay       ------

## 2012-12-17 NOTE — Telephone Encounter (Signed)
Message copied by Derry Skill on Tue Dec 17, 2012  4:48 PM ------      Message from: Meredith Pel      Created: Tue Dec 17, 2012 11:59 AM      Regarding: RE: Prescription request       Terri Gay, her hemorrhoids seem to be bleeding a lot. We can refill supp but please make her an appt with Brodie to discuss potential banding. Thanks      ----- Message -----         From: Derry Skill, CMA         Sent: 12/16/2012   4:40 PM           To: Meredith Pel, NP      Subject: Prescription request                                     I got a request for Hydorcort AC 25 mg suppositories for this pt from [facility].      This isn't even on her current med list.       You saw the patient 06-22-2011.  Amy saw this patient 09-19-2012.       This is a Dr. Juanda Chance patient.             Terri Gay       ------

## 2012-12-18 NOTE — Telephone Encounter (Signed)
Spoke to patient and she said she is not having any bleeding now. Her doctor at [facility] is planning on doing hemorrhoidal banding when she is ready to do it.

## 2013-02-21 ENCOUNTER — Encounter: Payer: BC Managed Care – PPO | Admitting: Family Medicine

## 2013-03-21 ENCOUNTER — Encounter: Payer: BC Managed Care – PPO | Admitting: Family Medicine

## 2013-04-25 ENCOUNTER — Ambulatory Visit (INDEPENDENT_AMBULATORY_CARE_PROVIDER_SITE_OTHER): Payer: BC Managed Care – PPO | Admitting: Family Medicine

## 2013-04-25 ENCOUNTER — Encounter: Payer: Self-pay | Admitting: Family Medicine

## 2013-04-25 VITALS — BP 142/88 | HR 65 | Temp 98.4°F | Ht 63.5 in | Wt 145.1 lb

## 2013-04-25 DIAGNOSIS — I10 Essential (primary) hypertension: Secondary | ICD-10-CM

## 2013-04-25 DIAGNOSIS — R002 Palpitations: Secondary | ICD-10-CM

## 2013-04-25 DIAGNOSIS — K219 Gastro-esophageal reflux disease without esophagitis: Secondary | ICD-10-CM

## 2013-04-25 DIAGNOSIS — M899 Disorder of bone, unspecified: Secondary | ICD-10-CM

## 2013-04-25 DIAGNOSIS — F418 Other specified anxiety disorders: Secondary | ICD-10-CM

## 2013-04-25 DIAGNOSIS — G4700 Insomnia, unspecified: Secondary | ICD-10-CM

## 2013-04-25 DIAGNOSIS — M8580 Other specified disorders of bone density and structure, unspecified site: Secondary | ICD-10-CM | POA: Insufficient documentation

## 2013-04-25 DIAGNOSIS — E785 Hyperlipidemia, unspecified: Secondary | ICD-10-CM

## 2013-04-25 DIAGNOSIS — F341 Dysthymic disorder: Secondary | ICD-10-CM

## 2013-04-25 DIAGNOSIS — Z0000 Encounter for general adult medical examination without abnormal findings: Secondary | ICD-10-CM

## 2013-04-25 HISTORY — DX: Other specified disorders of bone density and structure, unspecified site: M85.80

## 2013-04-25 LAB — CBC
HCT: 36.2 % (ref 36.0–46.0)
Hemoglobin: 12.3 g/dL (ref 12.0–15.0)
MCH: 28.6 pg (ref 26.0–34.0)
MCHC: 34 g/dL (ref 30.0–36.0)
MCV: 84.2 fL (ref 78.0–100.0)
Platelets: 226 10*3/uL (ref 150–400)
RBC: 4.3 MIL/uL (ref 3.87–5.11)
RDW: 13.6 % (ref 11.5–15.5)
WBC: 7.5 10*3/uL (ref 4.0–10.5)

## 2013-04-25 LAB — HEPATIC FUNCTION PANEL
ALT: 14 U/L (ref 0–35)
AST: 16 U/L (ref 0–37)
Albumin: 4.5 g/dL (ref 3.5–5.2)
Alkaline Phosphatase: 54 U/L (ref 39–117)
Bilirubin, Direct: 0.1 mg/dL (ref 0.0–0.3)
Indirect Bilirubin: 0.3 mg/dL (ref 0.0–0.9)
Total Bilirubin: 0.4 mg/dL (ref 0.3–1.2)
Total Protein: 6.9 g/dL (ref 6.0–8.3)

## 2013-04-25 LAB — RENAL FUNCTION PANEL
Albumin: 4.5 g/dL (ref 3.5–5.2)
BUN: 13 mg/dL (ref 6–23)
CO2: 30 meq/L (ref 19–32)
Calcium: 9.6 mg/dL (ref 8.4–10.5)
Chloride: 104 meq/L (ref 96–112)
Creat: 0.71 mg/dL (ref 0.50–1.10)
Glucose, Bld: 79 mg/dL (ref 70–99)
Phosphorus: 3.7 mg/dL (ref 2.3–4.6)
Potassium: 4.1 meq/L (ref 3.5–5.3)
Sodium: 140 meq/L (ref 135–145)

## 2013-04-25 LAB — LIPID PANEL
Cholesterol: 195 mg/dL (ref 0–200)
HDL: 58 mg/dL
LDL Cholesterol: 122 mg/dL — ABNORMAL HIGH (ref 0–99)
Total CHOL/HDL Ratio: 3.4 ratio
Triglycerides: 73 mg/dL
VLDL: 15 mg/dL (ref 0–40)

## 2013-04-25 LAB — TSH: TSH: 0.999 u[IU]/mL (ref 0.350–4.500)

## 2013-04-25 MED ORDER — BISOPROLOL-HYDROCHLOROTHIAZIDE 10-6.25 MG PO TABS: 1.0000 | ORAL_TABLET | Freq: Every day | ORAL | Status: DC

## 2013-04-25 NOTE — Progress Notes (Signed)
Pre visit review using our clinic review tool, if applicable. No additional management support is needed unless otherwise documented below in the visit note. 

## 2013-04-25 NOTE — Patient Instructions (Signed)
Curcumin for anti inflammatory  DASH Diet The DASH diet stands for "Dietary Approaches to Stop Hypertension." It is a healthy eating plan that has been shown to reduce high blood pressure (hypertension) in as little as 14 days, while also possibly providing other significant health benefits. These other health benefits include reducing the risk of breast cancer after menopause and reducing the risk of type 2 diabetes, heart disease, colon cancer, and stroke. Health benefits also include weight loss and slowing kidney failure in patients with chronic kidney disease.  DIET GUIDELINES  Limit salt (sodium). Your diet should contain less than 1500 mg of sodium daily.  Limit refined or processed carbohydrates. Your diet should include mostly whole grains. Desserts and added sugars should be used sparingly.  Include small amounts of heart-healthy fats. These types of fats include nuts, oils, and tub margarine. Limit saturated and trans fats. These fats have been shown to be harmful in the body. CHOOSING FOODS  The following food groups are based on a 2000 calorie diet. See your Registered Dietitian for individual calorie needs. Grains and Grain Products (6 to 8 servings daily)  Eat More Often: Whole-wheat bread, brown rice, whole-grain or wheat pasta, quinoa, popcorn without added fat or salt (air popped).  Eat Less Often: White bread, white pasta, white rice, cornbread. Vegetables (4 to 5 servings daily)  Eat More Often: Fresh, frozen, and canned vegetables. Vegetables may be raw, steamed, roasted, or grilled with a minimal amount of fat.  Eat Less Often/Avoid: Creamed or fried vegetables. Vegetables in a cheese sauce. Fruit (4 to 5 servings daily)  Eat More Often: All fresh, canned (in natural juice), or frozen fruits. Dried fruits without added sugar. One hundred percent fruit juice ( cup [237 mL] daily).  Eat Less Often: Dried fruits with added sugar. Canned fruit in light or heavy  syrup. [employer], Fish, and Poultry (2 servings or less daily. One serving is 3 to 4 oz [85-114 g]).  Eat More Often: Ninety percent or leaner ground beef, tenderloin, sirloin. Round cuts of beef, chicken breast, [place] breast. All fish. Grill, bake, or broil your meat. Nothing should be fried.  Eat Less Often/Avoid: Fatty cuts of meat, [place], or chicken leg, thigh, or wing. Fried cuts of meat or fish. Dairy (2 to 3 servings)  Eat More Often: Low-fat or fat-free milk, low-fat plain or light yogurt, reduced-fat or part-skim cheese.  Eat Less Often/Avoid: Milk (whole, 2%).Whole milk yogurt. Full-fat cheeses. Nuts, Seeds, and Legumes (4 to 5 servings per week)  Eat More Often: All without added salt.  Eat Less Often/Avoid: Salted nuts and seeds, canned beans with added salt. Fats and Sweets (limited)  Eat More Often: Vegetable oils, tub margarines without trans fats, sugar-free gelatin. Mayonnaise and salad dressings.  Eat Less Often/Avoid: Coconut oils, palm oils, butter, stick margarine, cream, half and half, cookies, candy, pie. FOR MORE INFORMATION The Dash Diet Eating Plan: www.dashdiet.org Document Released: 04/27/2011 Document Revised: 07/31/2011 Document Reviewed: 04/27/2011 [facility] Patient Information 2014 [place], [place]. Back Pain, Adult Low back pain is very common. About 1 in 5 people have back pain.The cause of low back pain is rarely dangerous. The pain often gets better over time.About half of people with a sudden onset of back pain feel better in just 2 weeks. About 8 in 10 people feel better by 6 weeks.  CAUSES Some common causes of back pain include:  Strain of the muscles or ligaments supporting the spine.  Wear and tear (degeneration) of  the spinal discs.  Arthritis.  Direct injury to the back. DIAGNOSIS Most of the time, the direct cause of low back pain is not known.However, back pain can be treated effectively even when the exact cause of the pain is  unknown.Answering your caregiver's questions about your overall health and symptoms is one of the most accurate ways to make sure the cause of your pain is not dangerous. If your caregiver needs more information, he or she may order lab work or imaging tests (X-rays or MRIs).However, even if imaging tests show changes in your back, this usually does not require surgery. HOME CARE INSTRUCTIONS For many people, back pain returns.Since low back pain is rarely dangerous, it is often a condition that people can learn to [facility] their own.   Remain active. It is stressful on the back to sit or stand in one place. Do not sit, drive, or stand in one place for more than 30 minutes at a time. Take short walks on level surfaces as soon as pain allows.Try to increase the length of time you walk each day.  Do not stay in bed.Resting more than 1 or 2 days can delay your recovery.  Do not avoid exercise or work.Your body is made to move.It is not dangerous to be active, even though your back may hurt.Your back will likely heal faster if you return to being active before your pain is gone.  Pay attention to your body when you bend and lift. Many people have less discomfortwhen lifting if they bend their knees, keep the load close to their bodies,and avoid twisting. Often, the most comfortable positions are those that put less stress on your recovering back.  Find a comfortable position to sleep. Use a firm mattress and lie on your side with your knees slightly bent. If you lie on your back, put a pillow under your knees.  Only take over-the-counter or prescription medicines as directed by your caregiver. Over-the-counter medicines to reduce pain and inflammation are often the most helpful.Your caregiver may prescribe muscle relaxant drugs.These medicines help dull your pain so you can more quickly return to your normal activities and healthy exercise.  Put ice on the injured area.  Put ice in a  plastic bag.  Place a towel between your skin and the bag.  Leave the ice on for 15-20 minutes, 03-04 times a day for the first 2 to 3 days. After that, ice and heat may be alternated to reduce pain and spasms.  Ask your caregiver about trying back exercises and gentle massage. This may be of some benefit.  Avoid feeling anxious or stressed.Stress increases muscle tension and can worsen back pain.It is important to recognize when you are anxious or stressed and learn ways to manage it.Exercise is a great option. SEEK MEDICAL CARE IF:  You have pain that is not relieved with rest or medicine.  You have pain that does not improve in 1 week.  You have new symptoms.  You are generally not feeling well. SEEK IMMEDIATE MEDICAL CARE IF:   You have pain that radiates from your back into your legs.  You develop new bowel or bladder control problems.  You have unusual weakness or numbness in your arms or legs.  You develop nausea or vomiting.  You develop abdominal pain.  You feel faint. Document Released: 05/08/2005 Document Revised: 11/07/2011 Document Reviewed: 09/26/2010 [facility] Patient Information 2014 [place], [place].

## 2013-04-25 NOTE — Progress Notes (Signed)
Patient ID: Terri Gay, female   DOB: August 23, 1950, 62 y.o.   MRN: [id number] Terri Gay [id number] Aug 05, 1950 04/25/2013      Progress Note-Follow Up  Subjective  Chief Complaint  Chief Complaint  Patient presents with  . Annual Exam    physical- no breast or pelvic exam    HPI  Patient is a 62 year old Caucasian exam NTG for annual exam. Generally doing well but does have an occasional palpitation. Mostly in the evenings. Not with activity. No associated symptoms. No shortness of breath, chest pain, diaphoresis, nausea. The palpitations are short-lived cysts. Do not awaken her from sleep. No GI or GU complaints. Taking meds as prescribed.   Past Medical History  Diagnosis Date  . Hypertension   . Allergy     seasonal  . Diverticulosis 10/07/2010    Colonoscopy  . Arthritis   . Depression   . Hyperlipidemia   . Anxiety   . History of chicken pox   . Hemorrhoids   . GERD (gastroesophageal reflux disease)   . Osteoporosis   . Osteopenia 04/25/2013    Past Surgical History  Procedure Laterality Date  . No past surgeries      Family History  Problem Relation Age of Onset  . Lung cancer Father   . Cancer Father     lung  . Lung cancer Paternal Grandfather   . Cancer Paternal Grandfather     lung  . Diabetes Mother   . Arthritis Mother   . Hyperlipidemia Mother   . Hypertension Mother   . Diabetes Maternal Grandmother   . Heart disease Maternal Grandmother   . Arthritis Maternal Grandmother   . Hyperlipidemia Maternal Grandmother   . Hypertension Maternal Grandmother   . Hyperlipidemia Sister   . Arthritis Paternal Grandmother   . Colon cancer Neg Hx   . Esophageal cancer Neg Hx   . Rectal cancer Neg Hx   . Stomach cancer Neg Hx     History   Social History  . Marital Status: Married    Spouse Name: N/A    Number of Children: 2  . Years of Education: N/A   Occupational History  . Customer Service    Social History Main Topics  . Smoking status:  Never Smoker   . Smokeless tobacco: Never Used  . Alcohol Use: 0.6 oz/week    1 Glasses of wine per week     Comment: occasionally  . Drug Use: No  . Sexual Activity: Not on file   Other Topics Concern  . Not on file   Social History Narrative   Caffeine Use: 1 cup coffee and 1 cup tea          Current Outpatient Prescriptions on File Prior to Visit  Medication Sig Dispense Refill  . ALPRAZolam (XANAX) 0.5 MG tablet Take 1 tablet (0.5 mg total) by mouth at bedtime as needed.  30 tablet  0  . bisoprolol-hydrochlorothiazide (ZIAC) 10-6.25 MG per tablet Take 1 tablet by mouth daily.  90 tablet  3  . Calcium Carbonate (CALCIUM 600 PO) Take 1,200 mg by mouth daily.      . Cholecalciferol (VITAMIN D-3) 5000 UNITS TABS Take 5,000 Units by mouth daily.       Marland Kitchen DHEA 10 MG CAPS Take 1 capsule by mouth daily.       Marland Kitchen estradiol (ESTRACE) 0.1 MG/GM vaginal cream Place 2 g vaginally 3 (three) times a week.      . hydrocortisone (ANUSOL-HC)  25 MG suppository Use 1 suppository at bedtime for 10 days.  10 suppository  1  . ibuprofen (ADVIL,MOTRIN) 200 MG tablet Take 600 mg by mouth daily as needed. For pain      . magnesium gluconate (MAGONATE) 500 MG tablet Take 500 mg by mouth daily.      . Omega-3 Fatty Acids (FISH OIL) 1200 MG CAPS Take 1,200 mg by mouth daily.      . pantoprazole (PROTONIX) 40 MG tablet Take 1 tablet (40 mg total) by mouth daily.  90 tablet  3  . Probiotic Product (PROBIOTIC DAILY) CAPS Take 1 capsule by mouth daily.      . progesterone (PROMETRIUM) 100 MG capsule Take 100 mg by mouth at bedtime.        . RED YEAST RICE EXTRACT PO Take 4 tablets by mouth at bedtime.      . temazepam (RESTORIL) 30 MG capsule Take 30 mg by mouth at bedtime as needed for sleep.      . Tretinoin, Facial Wrinkles, 0.05 % CREA Apply 1 drop topically at bedtime.  60 g  1   Current Facility-Administered Medications on File Prior to Visit  Medication Dose Route Frequency Provider Last Rate Last Dose   . 0.9 %  sodium chloride infusion  500 mL Intravenous Continuous Hart Carwin, MD        Allergies  Allergen Reactions  . Amoxicillin Diarrhea    Review of Systems  Review of Systems  Constitutional: Negative for fever and malaise/fatigue.  HENT: Negative for congestion.   Eyes: Negative for discharge.  Respiratory: Negative for shortness of breath.   Cardiovascular: Positive for palpitations. Negative for chest pain and leg swelling.  Gastrointestinal: Negative for nausea, abdominal pain and diarrhea.  Genitourinary: Negative for dysuria.  Musculoskeletal: Negative for falls.  Skin: Negative for rash.  Neurological: Negative for loss of consciousness and headaches.  Endo/Heme/Allergies: Negative for polydipsia.  Psychiatric/Behavioral: Negative for depression and suicidal ideas. The patient is nervous/anxious and has insomnia.     Objective  BP 142/90  Pulse 65  Temp(Src) 98.4 F (36.9 C) (Oral)  Ht 5' 3.5" (1.613 m)  Wt 145 lb 1.3 oz (65.808 kg)  BMI 25.29 kg/m2  SpO2 95%  Physical Exam  Physical Exam  Constitutional: She is oriented to person, place, and time and well-developed, well-nourished, and in no distress. No distress.  HENT:  Head: Normocephalic and atraumatic.  Right Ear: External ear normal.  Left Ear: External ear normal.  Nose: Nose normal.  Mouth/Throat: Oropharynx is clear and moist. No oropharyngeal exudate.  Eyes: Conjunctivae are normal. Pupils are equal, round, and reactive to light. Right eye exhibits no discharge. Left eye exhibits no discharge. No scleral icterus.  Neck: Normal range of motion. Neck supple. No thyromegaly present.  Cardiovascular: Normal rate, regular rhythm, normal heart sounds and intact distal pulses.   No murmur heard. Pulmonary/Chest: Effort normal and breath sounds normal. No respiratory distress. She has no wheezes. She has no rales.  Abdominal: Soft. Bowel sounds are normal. She exhibits no distension and no mass.  There is no tenderness.  Musculoskeletal: Normal range of motion. She exhibits no edema and no tenderness.  Lymphadenopathy:    She has no cervical adenopathy.  Neurological: She is alert and oriented to person, place, and time. She has normal reflexes. No cranial nerve deficit. Coordination normal.  Skin: Skin is warm and dry. No rash noted. She is not diaphoretic.  Psychiatric: Mood, memory and affect  normal.    Lab Results  Component Value Date   TSH 0.818 09/29/2010   Lab Results  Component Value Date   WBC 9.1 09/13/2012   HGB 13.2 09/13/2012   HCT 39.1 09/13/2012   MCV 85.2 09/13/2012   PLT 192 09/13/2012   Lab Results  Component Value Date   CREATININE 0.73 09/13/2012   BUN 11 09/13/2012   NA 140 09/13/2012   K 4.3 09/13/2012   CL 102 09/13/2012   CO2 29 09/13/2012   Lab Results  Component Value Date   ALT 17 09/13/2012   AST 21 09/13/2012   ALKPHOS 65 09/13/2012   BILITOT 0.3 09/13/2012   Lab Results  Component Value Date   CHOL 228* 02/29/2012   Lab Results  Component Value Date   HDL 56 02/29/2012   Lab Results  Component Value Date   LDLCALC 156* 02/29/2012   Lab Results  Component Value Date   TRIG 81 02/29/2012   Lab Results  Component Value Date   CHOLHDL 4.1 02/29/2012     Assessment & Plan  HTN (hypertension) Adequately controlled, no changes.   GERD (gastroesophageal reflux disease) Well controlled on current meds, avoid offending foods and start a probiotic daily  Insomnia Tolerating Temazepam prn, may continue  Hyperlipidemia Tolerating Red Yeast Rice, avoid trans fats. No changes  Depression with anxiety Venlafaxine XR has been dropped 37.5 mg daily  Palpitations ekg normal sinus rhythm, unchanged from previous. Avoid caffeine, report worsening symptoms  Preventative health care Encouraged heart healthy diet, regular exercise, adequate sleep. Had flu shot already. Pneumonia declined. Declined colonoscopy

## 2013-04-26 ENCOUNTER — Encounter: Payer: Self-pay | Admitting: Family Medicine

## 2013-04-26 DIAGNOSIS — R002 Palpitations: Secondary | ICD-10-CM | POA: Insufficient documentation

## 2013-04-26 DIAGNOSIS — Z0000 Encounter for general adult medical examination without abnormal findings: Secondary | ICD-10-CM | POA: Insufficient documentation

## 2013-04-26 HISTORY — DX: Encounter for general adult medical examination without abnormal findings: Z00.00

## 2013-04-26 HISTORY — DX: Palpitations: R00.2

## 2013-04-26 LAB — VITAMIN D 25 HYDROXY (VIT D DEFICIENCY, FRACTURES): Vit D, 25-Hydroxy: 76 ng/mL (ref 30–89)

## 2013-04-26 NOTE — Assessment & Plan Note (Signed)
ekg normal sinus rhythm, unchanged from previous. Avoid caffeine, report worsening symptoms

## 2013-04-26 NOTE — Assessment & Plan Note (Signed)
Well controlled on current meds, avoid offending foods and start a probiotic daily

## 2013-04-26 NOTE — Assessment & Plan Note (Signed)
Tolerating Red Yeast Rice, avoid trans fats. No changes

## 2013-04-26 NOTE — Assessment & Plan Note (Signed)
Tolerating Temazepam prn, may continue

## 2013-04-26 NOTE — Assessment & Plan Note (Signed)
Venlafaxine XR has been dropped 37.5 mg daily

## 2013-04-26 NOTE — Assessment & Plan Note (Signed)
Adequately controlled, no changes 

## 2013-04-26 NOTE — Assessment & Plan Note (Addendum)
Encouraged heart healthy diet, regular exercise, adequate sleep. Had flu shot already. Pneumonia declined. Declined colonoscopy

## 2013-10-24 ENCOUNTER — Ambulatory Visit (INDEPENDENT_AMBULATORY_CARE_PROVIDER_SITE_OTHER): Payer: BC Managed Care – PPO | Admitting: Family Medicine

## 2013-10-24 ENCOUNTER — Encounter: Payer: Self-pay | Admitting: Family Medicine

## 2013-10-24 VITALS — BP 112/72 | HR 71 | Temp 98.0°F | Ht 63.5 in | Wt 144.0 lb

## 2013-10-24 DIAGNOSIS — F418 Other specified anxiety disorders: Secondary | ICD-10-CM

## 2013-10-24 DIAGNOSIS — E785 Hyperlipidemia, unspecified: Secondary | ICD-10-CM

## 2013-10-24 DIAGNOSIS — F341 Dysthymic disorder: Secondary | ICD-10-CM

## 2013-10-24 DIAGNOSIS — I10 Essential (primary) hypertension: Secondary | ICD-10-CM

## 2013-10-24 DIAGNOSIS — M25562 Pain in left knee: Secondary | ICD-10-CM

## 2013-10-24 DIAGNOSIS — Z23 Encounter for immunization: Secondary | ICD-10-CM

## 2013-10-24 DIAGNOSIS — M25569 Pain in unspecified knee: Secondary | ICD-10-CM

## 2013-10-24 HISTORY — DX: Pain in left knee: M25.562

## 2013-10-24 MED ORDER — BISOPROLOL-HYDROCHLOROTHIAZIDE 10-6.25 MG PO TABS: 1.0000 | ORAL_TABLET | Freq: Every day | ORAL | Status: DC

## 2013-10-24 MED ORDER — ZOSTER VACCINE LIVE 19400 UNT/0.65ML ~~LOC~~ SOLR: 0.6500 mL | Freq: Once | SUBCUTANEOUS | Status: DC

## 2013-10-24 NOTE — Progress Notes (Signed)
Pre visit review using our clinic review tool, if applicable. No additional management support is needed unless otherwise documented below in the visit note. 

## 2013-10-24 NOTE — Patient Instructions (Addendum)
[place], ice and call if no better   DASH Diet The DASH diet stands for "Dietary Approaches to Stop Hypertension." It is a healthy eating plan that has been shown to reduce high blood pressure (hypertension) in as little as 14 days, while also possibly providing other significant health benefits. These other health benefits include reducing the risk of breast cancer after menopause and reducing the risk of type 2 diabetes, heart disease, colon cancer, and stroke. Health benefits also include weight loss and slowing kidney failure in patients with chronic kidney disease.  DIET GUIDELINES  Limit salt (sodium). Your diet should contain less than 1500 mg of sodium daily.  Limit refined or processed carbohydrates. Your diet should include mostly whole grains. Desserts and added sugars should be used sparingly.  Include small amounts of heart-healthy fats. These types of fats include nuts, oils, and tub margarine. Limit saturated and trans fats. These fats have been shown to be harmful in the body. CHOOSING FOODS  The following food groups are based on a 2000 calorie diet. See your Registered Dietitian for individual calorie needs. Grains and Grain Products (6 to 8 servings daily)  Eat More Often: Whole-wheat bread, brown rice, whole-grain or wheat pasta, quinoa, popcorn without added fat or salt (air popped).  Eat Less Often: White bread, white pasta, white rice, cornbread. Vegetables (4 to 5 servings daily)  Eat More Often: Fresh, frozen, and canned vegetables. Vegetables may be raw, steamed, roasted, or grilled with a minimal amount of fat.  Eat Less Often/Avoid: Creamed or fried vegetables. Vegetables in a cheese sauce. Fruit (4 to 5 servings daily)  Eat More Often: All fresh, canned (in natural juice), or frozen fruits. Dried fruits without added sugar. One hundred percent fruit juice ( cup [237 mL] daily).  Eat Less Often: Dried fruits with added sugar. Canned fruit in light or heavy  syrup. [employer], Fish, and Poultry (2 servings or less daily. One serving is 3 to 4 oz [85-114 g]).  Eat More Often: Ninety percent or leaner ground beef, tenderloin, sirloin. Round cuts of beef, chicken breast, [place] breast. All fish. Grill, bake, or broil your meat. Nothing should be fried.  Eat Less Often/Avoid: Fatty cuts of meat, [place], or chicken leg, thigh, or wing. Fried cuts of meat or fish. Dairy (2 to 3 servings)  Eat More Often: Low-fat or fat-free milk, low-fat plain or light yogurt, reduced-fat or part-skim cheese.  Eat Less Often/Avoid: Milk (whole, 2%).Whole milk yogurt. Full-fat cheeses. Nuts, Seeds, and Legumes (4 to 5 servings per week)  Eat More Often: All without added salt.  Eat Less Often/Avoid: Salted nuts and seeds, canned beans with added salt. Fats and Sweets (limited)  Eat More Often: Vegetable oils, tub margarines without trans fats, sugar-free gelatin. Mayonnaise and salad dressings.  Eat Less Often/Avoid: Coconut oils, palm oils, butter, stick margarine, cream, half and half, cookies, candy, pie. FOR MORE INFORMATION The Dash Diet Eating Plan: www.dashdiet.org Document Released: 04/27/2011 Document Revised: 07/31/2011 Document Reviewed: 04/27/2011 [facility] Patient Information 2014 [place], [place].

## 2013-10-24 NOTE — Assessment & Plan Note (Signed)
Well controlled, no changes to meds. Encouraged heart healthy diet such as the DASH diet and exercise as tolerated.  °

## 2013-10-24 NOTE — Progress Notes (Signed)
Patient ID: Terri Gay, female   DOB: [birth year], 63 y.o.   MRN: [id number] Terri Gay [id number] [birth year] 10/24/2013      Progress Note-Follow Up  Subjective  Chief Complaint  Chief Complaint  Patient presents with  . Follow-up    6 month    HPI  Patient is a 63 year old female in today for routine medical care. Doing well. Chose to stop her Effexor recently and has tolerated that well. Continues to always occur frequently. Has some trouble with her left knee off and on for the last year. Worse with stairs. Tends to hurt more medially. No redness swelling or warmth. No recent injury.. Denies CP/palp/SOB/HA/congestion/fevers/GI or GU c/o. Taking meds as prescribed  Past Medical History  Diagnosis Date  . Hypertension   . Allergy     seasonal  . Diverticulosis 10/07/2010    Colonoscopy  . Arthritis   . Depression   . Hyperlipidemia   . Anxiety   . History of chicken pox   . Hemorrhoids   . GERD (gastroesophageal reflux disease)   . Osteoporosis   . Osteopenia 04/25/2013  . Palpitations 04/26/2013  . Preventative health care 04/26/2013    Past Surgical History  Procedure Laterality Date  . No past surgeries      Family History  Problem Relation Age of Onset  . Lung cancer Father   . Cancer Father     lung  . Lung cancer Paternal Grandfather   . Cancer Paternal Grandfather     lung  . Diabetes Mother   . Arthritis Mother   . Hyperlipidemia Mother   . Hypertension Mother   . Heart disease Mother     CHF  . Diabetes Maternal Grandmother   . Heart disease Maternal Grandmother   . Arthritis Maternal Grandmother   . Hyperlipidemia Maternal Grandmother   . Hypertension Maternal Grandmother   . Hyperlipidemia Sister   . Arthritis Paternal Grandmother   . Colon cancer Neg Hx   . Esophageal cancer Neg Hx   . Rectal cancer Neg Hx   . Stomach cancer Neg Hx   . Heart disease Daughter     arrythmia, Internal Cardiac Defibrillator  . Lupus Son     History    Social History  . Marital Status: Married    Spouse Name: N/A    Number of Children: 2  . Years of Education: N/A   Occupational History  . Customer Service    Social History Main Topics  . Smoking status: Never Smoker   . Smokeless tobacco: Never Used  . Alcohol Use: 0.6 oz/week    1 Glasses of wine per week     Comment: occasionally  . Drug Use: No  . Sexual Activity: Not on file   Other Topics Concern  . Not on file   Social History Narrative   Caffeine Use: 1 cup coffee and 1 cup tea          Current Outpatient Prescriptions on File Prior to Visit  Medication Sig Dispense Refill  . ALPRAZolam (XANAX) 0.5 MG tablet Take 1 tablet (0.5 mg total) by mouth at bedtime as needed.  30 tablet  0  . bisoprolol-hydrochlorothiazide (ZIAC) 10-6.25 MG per tablet Take 1 tablet by mouth daily.  90 tablet  3  . Calcium Carbonate (CALCIUM 600 PO) Take 1,200 mg by mouth daily.      . Cholecalciferol (VITAMIN D-3) 5000 UNITS TABS Take 5,000 Units by mouth daily.       Marland Kitchen  DHEA 10 MG CAPS Take 1 capsule by mouth daily.       Marland Kitchen estradiol (ESTRACE) 0.1 MG/GM vaginal cream Place 2 g vaginally 3 (three) times a week.      . hydrocortisone (ANUSOL-HC) 25 MG suppository Use 1 suppository at bedtime for 10 days.  10 suppository  1  . ibuprofen (ADVIL,MOTRIN) 200 MG tablet Take 600 mg by mouth daily as needed. For pain      . magnesium gluconate (MAGONATE) 500 MG tablet Take 500 mg by mouth daily.      . Omega-3 Fatty Acids (FISH OIL) 1200 MG CAPS Take 1,200 mg by mouth daily.      . Probiotic Product (PROBIOTIC DAILY) CAPS Take 1 capsule by mouth daily.      . progesterone (PROMETRIUM) 100 MG capsule Take 100 mg by mouth at bedtime.        . RED YEAST RICE EXTRACT PO Take 4 tablets by mouth at bedtime.      . temazepam (RESTORIL) 30 MG capsule Take 30 mg by mouth at bedtime as needed for sleep.      . Tretinoin, Facial Wrinkles, 0.05 % CREA Apply 1 drop topically at bedtime.  60 g  1   Current  Facility-Administered Medications on File Prior to Visit  Medication Dose Route Frequency Provider Last Rate Last Dose  . 0.9 %  sodium chloride infusion  500 mL Intravenous Continuous Lafayette Dragon, MD        Allergies  Allergen Reactions  . Amoxicillin Diarrhea    Review of Systems  Review of Systems  Musculoskeletal: Positive for joint pain.    Objective  BP 112/72  Pulse 71  Temp(Src) 98 F (36.7 C) (Oral)  Ht 5' 3.5" (1.613 m)  Wt 144 lb (65.318 kg)  BMI 25.11 kg/m2  SpO2 99%  Physical Exam  Physical Exam  Constitutional: She is oriented to person, place, and time and well-developed, well-nourished, and in no distress. No distress.  HENT:  Head: Normocephalic and atraumatic.  Eyes: Conjunctivae are normal.  Neck: Neck supple. No thyromegaly present.  Cardiovascular: Normal rate, regular rhythm and normal heart sounds.   No murmur heard. Pulmonary/Chest: Effort normal and breath sounds normal. She has no wheezes.  Abdominal: She exhibits no distension and no mass.  Musculoskeletal: She exhibits no edema.  Lymphadenopathy:    She has no cervical adenopathy.  Neurological: She is alert and oriented to person, place, and time.  Skin: Skin is warm and dry. No rash noted. She is not diaphoretic.  Psychiatric: Memory, affect and judgment normal.    Lab Results  Component Value Date   TSH 0.999 04/25/2013   Lab Results  Component Value Date   WBC 7.5 04/25/2013   HGB 12.3 04/25/2013   HCT 36.2 04/25/2013   MCV 84.2 04/25/2013   PLT 226 04/25/2013   Lab Results  Component Value Date   CREATININE 0.71 04/25/2013   BUN 13 04/25/2013   NA 140 04/25/2013   K 4.1 04/25/2013   CL 104 04/25/2013   CO2 30 04/25/2013   Lab Results  Component Value Date   ALT 14 04/25/2013   AST 16 04/25/2013   ALKPHOS 54 04/25/2013   BILITOT 0.4 04/25/2013   Lab Results  Component Value Date   CHOL 195 04/25/2013   Lab Results  Component Value Date   HDL 58 04/25/2013   Lab Results   Component Value Date   LDLCALC 122* 04/25/2013   Lab  Results  Component Value Date   TRIG 73 04/25/2013   Lab Results  Component Value Date   CHOLHDL 3.4 04/25/2013     Assessment & Plan  No problem-specific assessment & plan notes found for this encounter.   HTN (hypertension) Well controlled, no changes to meds. Encouraged heart healthy diet such as the DASH diet and exercise as tolerated.   Knee pain, left Likely a medial meniscal strain. Has chosen to see a chiropractor, she will get started and let us know if it is not getting better. Then consider sports med consult if persistent. Add [employer], Aspercreme bid.   Depression with anxiety Chose to stop Venlafaxine, doing well needs Alprazolam infrequently  Hyperlipidemia Tolerating red yeast rice, Encouraged heart healthy diet, increase exercise, avoid trans fats, consider a krill oil cap daily

## 2013-10-24 NOTE — Assessment & Plan Note (Signed)
Likely a medial meniscal strain. Has chosen to see a chiropractor, she will get started and let us know if it is not getting better. Then consider sports med consult if persistent. Add [employer], Aspercreme bid.

## 2013-10-27 ENCOUNTER — Telehealth: Payer: Self-pay | Admitting: Family Medicine

## 2013-10-27 NOTE — Telephone Encounter (Signed)
Relevant patient education assigned to patient using Emmi. ° °

## 2013-10-30 NOTE — Assessment & Plan Note (Signed)
Tolerating red yeast rice, Encouraged heart healthy diet, increase exercise, avoid trans fats, consider a krill oil cap daily

## 2013-10-30 NOTE — Assessment & Plan Note (Signed)
Chose to stop Venlafaxine, doing well needs Alprazolam infrequently

## 2013-11-03 ENCOUNTER — Other Ambulatory Visit: Payer: Self-pay | Admitting: Obstetrics and Gynecology

## 2013-11-04 LAB — CYTOLOGY - PAP

## 2014-05-01 ENCOUNTER — Encounter: Payer: BC Managed Care – PPO | Admitting: Family Medicine

## 2014-08-07 ENCOUNTER — Encounter: Payer: BC Managed Care – PPO | Admitting: Family Medicine

## 2014-09-25 ENCOUNTER — Ambulatory Visit: Payer: Self-pay | Admitting: Family Medicine

## 2014-10-16 ENCOUNTER — Ambulatory Visit (INDEPENDENT_AMBULATORY_CARE_PROVIDER_SITE_OTHER): Payer: BLUE CROSS/BLUE SHIELD | Admitting: Family Medicine

## 2014-10-16 ENCOUNTER — Encounter: Payer: Self-pay | Admitting: Family Medicine

## 2014-10-16 VITALS — BP 153/90 | HR 68 | Temp 98.3°F | Ht 62.0 in | Wt 147.1 lb

## 2014-10-16 DIAGNOSIS — F418 Other specified anxiety disorders: Secondary | ICD-10-CM

## 2014-10-16 DIAGNOSIS — K219 Gastro-esophageal reflux disease without esophagitis: Secondary | ICD-10-CM

## 2014-10-16 DIAGNOSIS — G4700 Insomnia, unspecified: Secondary | ICD-10-CM

## 2014-10-16 DIAGNOSIS — I10 Essential (primary) hypertension: Secondary | ICD-10-CM | POA: Diagnosis not present

## 2014-10-16 DIAGNOSIS — E785 Hyperlipidemia, unspecified: Secondary | ICD-10-CM

## 2014-10-16 MED ORDER — BISOPROLOL FUMARATE 10 MG PO TABS: 10.0000 mg | ORAL_TABLET | Freq: Every day | ORAL | Status: DC

## 2014-10-16 MED ORDER — HYDROCHLOROTHIAZIDE 12.5 MG PO CAPS: 12.5000 mg | ORAL_CAPSULE | Freq: Every day | ORAL | Status: DC

## 2014-10-16 NOTE — Patient Instructions (Signed)
[place], online at [email or web address]  Mega Red Krill oil by [employer]  Cholesterol Cholesterol is a white, waxy, fat-like substance needed by your body in small amounts. The liver makes all the cholesterol you need. Cholesterol is carried from the liver by the blood through the blood vessels. Deposits of cholesterol (plaque) may build up on blood vessel walls. These make the arteries narrower and stiffer. Cholesterol plaques increase the risk for heart attack and stroke.  You cannot feel your cholesterol level even if it is very high. The only way to know it is high is with a blood test. Once you know your cholesterol levels, you should keep a record of the test results. Work with your health care provider to keep your levels in the desired range.  WHAT DO THE RESULTS MEAN?  Total cholesterol is a rough measure of all the cholesterol in your blood.   LDL is the so-called bad cholesterol. This is the type that deposits cholesterol in the walls of the arteries. You want this level to be low.   HDL is the good cholesterol because it cleans the arteries and carries the LDL away. You want this level to be high.  Triglycerides are fat that the body can either burn for energy or store. High levels are closely linked to heart disease.  WHAT ARE THE DESIRED LEVELS OF CHOLESTEROL?  Total cholesterol below 200.   LDL below 100 for people at risk, below 70 for those at very high risk.   HDL above 50 is good, above 60 is best.   Triglycerides below 150.  HOW CAN I LOWER MY CHOLESTEROL?  Diet. Follow your diet programs as directed by your health care provider.   Choose fish or white meat chicken and [place], roasted or baked. Limit fatty cuts of red meat, fried foods, and processed meats, such as sausage and lunch meats.   Eat lots of fresh fruits and vegetables.  Choose whole grains, beans, pasta, potatoes, and cereals.   Use only small amounts of olive, corn, or canola oils.    Avoid butter, mayonnaise, shortening, or palm kernel oils.  Avoid foods with trans fats.   Drink skim or nonfat milk and eat low-fat or nonfat yogurt and cheeses. Avoid whole milk, cream, ice cream, egg yolks, and full-fat cheeses.   Healthy desserts include angel food cake, ginger snaps, animal crackers, hard candy, popsicles, and low-fat or nonfat frozen yogurt. Avoid pastries, cakes, pies, and cookies.   Exercise. Follow your exercise programs as directed by your health care provider.   A regular program helps decrease LDL and raise HDL.   A regular program helps with weight control.   Do things that increase your activity level like gardening, walking, or taking the stairs. Ask your health care provider about how you can be more active in your daily life.   Medicine. Take medicine only as directed by your health care provider.   Medicine may be prescribed by your health care provider to help lower cholesterol and decrease the risk for heart disease.   If you have several risk factors, you may need medicine even if your levels are normal. Document Released: 01/31/2001 Document Revised: 09/22/2013 Document Reviewed: 02/19/2013 [facility] Patient Information 2015 [place], [place]. This information is not intended to replace advice given to you by your health care provider. Make sure you discuss any questions you have with your health care provider.

## 2014-10-16 NOTE — Progress Notes (Signed)
Pre visit review using our clinic review tool, if applicable. No additional management support is needed unless otherwise documented below in the visit note. 

## 2014-10-25 NOTE — Progress Notes (Signed)
Terri Gay  [id number] 09/23/1950 10/25/2014      Progress Note-Follow Up  Subjective  Chief Complaint  Chief Complaint  Patient presents with  . Follow-up    blood pressure    HPI  Patient is a 64 y.o. female in today for routine medical care.  Patient in today for follow-up. Does note she's been under great deal of stress lately with some family illness but also her daughter's friend committed suicide yesterday. She denies suicidal ideation herself but does have some anhedonia and anxiety. No recent ill. Denies any other acute concerns. Well controlled, no changes to meds. Encouraged heart healthy diet such as the DASH diet and exercise as tolerated.   Past Medical History  Diagnosis Date  . Hypertension   . Allergy     seasonal  . Diverticulosis 10/07/2010    Colonoscopy  . Arthritis   . Depression   . Hyperlipidemia   . Anxiety   . History of chicken pox   . Hemorrhoids   . GERD (gastroesophageal reflux disease)   . Osteoporosis   . Osteopenia 04/25/2013  . Palpitations 04/26/2013  . Preventative health care 04/26/2013  . Knee pain, left 10/24/2013    Past Surgical History  Procedure Laterality Date  . No past surgeries      Family History  Problem Relation Age of Onset  . Lung cancer Father   . Cancer Father     lung  . Lung cancer Paternal Grandfather   . Cancer Paternal Grandfather     lung  . Diabetes Mother   . Arthritis Mother   . Hyperlipidemia Mother   . Hypertension Mother   . Heart disease Mother     CHF  . Diabetes Maternal Grandmother   . Heart disease Maternal Grandmother   . Arthritis Maternal Grandmother   . Hyperlipidemia Maternal Grandmother   . Hypertension Maternal Grandmother   . Hyperlipidemia Sister   . Arthritis Paternal Grandmother   . Colon cancer Neg Hx   . Esophageal cancer Neg Hx   . Rectal cancer Neg Hx   . Stomach cancer Neg Hx   . Heart disease Daughter     arrythmia, Internal Cardiac Defibrillator  . Lupus Son       History   Social History  . Marital Status: Married    Spouse Name: N/A  . Number of Children: 2  . Years of Education: N/A   Occupational History  . Customer Service    Social History Main Topics  . Smoking status: Never Smoker   . Smokeless tobacco: Never Used  . Alcohol Use: 0.6 oz/week    1 Glasses of wine per week     Comment: occasionally  . Drug Use: No  . Sexual Activity: Not on file   Other Topics Concern  . Not on file   Social History Narrative   Caffeine Use: 1 cup coffee and 1 cup tea          Current Outpatient Prescriptions on File Prior to Visit  Medication Sig Dispense Refill  . Calcium Carbonate (CALCIUM 600 PO) Take 1,200 mg by mouth daily.    . Cholecalciferol (VITAMIN D-3) 5000 UNITS TABS Take 5,000 Units by mouth daily.     Marland Kitchen estradiol (ESTRACE) 0.1 MG/GM vaginal cream Place 2 g vaginally 3 (three) times a week.    Marland Kitchen ibuprofen (ADVIL,MOTRIN) 200 MG tablet Take 600 mg by mouth daily as needed. For pain    . magnesium gluconate (  MAGONATE) 500 MG tablet Take 500 mg by mouth daily.    . Omega-3 Fatty Acids (FISH OIL) 1200 MG CAPS Take 1,200 mg by mouth daily.    . Probiotic Product (PROBIOTIC DAILY) CAPS Take 1 capsule by mouth daily.    . progesterone (PROMETRIUM) 100 MG capsule Take 100 mg by mouth at bedtime.      . temazepam (RESTORIL) 30 MG capsule Take 30 mg by mouth at bedtime as needed for sleep.    . hydrocortisone (ANUSOL-HC) 25 MG suppository Use 1 suppository at bedtime for 10 days. (Patient not taking: Reported on 10/16/2014) 10 suppository 1  . RED YEAST RICE EXTRACT PO Take 4 tablets by mouth at bedtime.     No current facility-administered medications on file prior to visit.    Allergies  Allergen Reactions  . Amoxicillin Diarrhea    Review of Systems  Review of Systems  Constitutional: Negative for fever and malaise/fatigue.  HENT: Negative for congestion.   Eyes: Negative for discharge.  Respiratory: Negative for  shortness of breath.   Cardiovascular: Negative for chest pain, palpitations and leg swelling.  Gastrointestinal: Positive for heartburn. Negative for nausea, abdominal pain and diarrhea.  Genitourinary: Negative for dysuria.  Musculoskeletal: Negative for falls.  Skin: Negative for rash.  Neurological: Negative for loss of consciousness and headaches.  Endo/Heme/Allergies: Negative for polydipsia.  Psychiatric/Behavioral: Negative for depression and suicidal ideas. The patient is nervous/anxious and has insomnia.     Objective  BP 153/90 mmHg  Pulse 68  Temp(Src) 98.3 F (36.8 C) (Oral)  Ht 5\' 2"  (1.575 m)  Wt 147 lb 2 oz (66.735 kg)  BMI 26.90 kg/m2  SpO2 99%  Physical Exam  Physical Exam  Constitutional: She is oriented to person, place, and time and well-developed, well-nourished, and in no distress. No distress.  HENT:  Head: Normocephalic and atraumatic.  Eyes: Conjunctivae are normal.  Neck: Neck supple. No thyromegaly present.  Cardiovascular: Normal rate, regular rhythm and normal heart sounds.   No murmur heard. Pulmonary/Chest: Effort normal and breath sounds normal. She has no wheezes.  Abdominal: She exhibits no distension and no mass.  Musculoskeletal: She exhibits no edema.  Lymphadenopathy:    She has no cervical adenopathy.  Neurological: She is alert and oriented to person, place, and time.  Skin: Skin is warm and dry. No rash noted. She is not diaphoretic.  Psychiatric: Memory, affect and judgment normal.    Lab Results  Component Value Date   TSH 0.999 04/25/2013   Lab Results  Component Value Date   WBC 7.5 04/25/2013   HGB 12.3 04/25/2013   HCT 36.2 04/25/2013   MCV 84.2 04/25/2013   PLT 226 04/25/2013   Lab Results  Component Value Date   CREATININE 0.71 04/25/2013   BUN 13 04/25/2013   NA 140 04/25/2013   K 4.1 04/25/2013   CL 104 04/25/2013   CO2 30 04/25/2013   Lab Results  Component Value Date   ALT 14 04/25/2013   AST 16  04/25/2013   ALKPHOS 54 04/25/2013   BILITOT 0.4 04/25/2013   Lab Results  Component Value Date   CHOL 195 04/25/2013   Lab Results  Component Value Date   HDL 58 04/25/2013   Lab Results  Component Value Date   LDLCALC 122* 04/25/2013   Lab Results  Component Value Date   TRIG 73 04/25/2013   Lab Results  Component Value Date   CHOLHDL 3.4 04/25/2013     Assessment &  Plan  HTN (hypertension) poorly controlled, add HCTZ 12.5 mg daily. Encouraged heart healthy diet such as the DASH diet and exercise as tolerated.    GERD (gastroesophageal reflux disease) Avoid offending foods, start probiotics. Do not eat large meals in late evening and consider raising head of bed.    Depression with anxiety Her daughter's friend committed suicide yesterday and she has had some stressors with family. She does feel meds are helping   Insomnia Encouraged good sleep hygiene such as dark, quiet room. No blue/green glowing lights such as computer screens in bedroom. No alcohol or stimulants in evening. Cut down on caffeine as able. Regular exercise is helpful but not just prior to bed time.    Hyperlipidemia Encouraged heart healthy diet, increase exercise, avoid trans fats, consider a krill oil cap daily

## 2014-10-25 NOTE — Assessment & Plan Note (Signed)
Her daughter's friend committed suicide yesterday and she has had some stressors with family. She does feel meds are helping

## 2014-10-25 NOTE — Assessment & Plan Note (Signed)
Avoid offending foods, start probiotics. Do not eat large meals in late evening and consider raising head of bed.  

## 2014-10-25 NOTE — Assessment & Plan Note (Signed)
Encouraged good sleep hygiene such as dark, quiet room. No blue/green glowing lights such as computer screens in bedroom. No alcohol or stimulants in evening. Cut down on caffeine as able. Regular exercise is helpful but not just prior to bed time.  

## 2014-10-25 NOTE — Assessment & Plan Note (Signed)
Encouraged heart healthy diet, increase exercise, avoid trans fats, consider a krill oil cap daily 

## 2014-10-25 NOTE — Assessment & Plan Note (Addendum)
poorly controlled, add HCTZ 12.5 mg daily. Encouraged heart healthy diet such as the DASH diet and exercise as tolerated.

## 2014-11-25 ENCOUNTER — Other Ambulatory Visit: Payer: Self-pay | Admitting: Obstetrics and Gynecology

## 2014-11-26 LAB — CYTOLOGY - PAP

## 2015-04-19 ENCOUNTER — Telehealth: Payer: Self-pay

## 2015-04-19 NOTE — Telephone Encounter (Signed)
Pre- Visit call Completed 

## 2015-04-20 ENCOUNTER — Ambulatory Visit (INDEPENDENT_AMBULATORY_CARE_PROVIDER_SITE_OTHER): Payer: BLUE CROSS/BLUE SHIELD | Admitting: Family Medicine

## 2015-04-20 ENCOUNTER — Encounter (INDEPENDENT_AMBULATORY_CARE_PROVIDER_SITE_OTHER): Payer: Self-pay

## 2015-04-20 ENCOUNTER — Encounter: Payer: Self-pay | Admitting: Family Medicine

## 2015-04-20 VITALS — BP 118/80 | HR 77 | Temp 98.6°F | Ht 62.0 in | Wt 150.0 lb

## 2015-04-20 DIAGNOSIS — Z23 Encounter for immunization: Secondary | ICD-10-CM | POA: Diagnosis not present

## 2015-04-20 DIAGNOSIS — M8580 Other specified disorders of bone density and structure, unspecified site: Secondary | ICD-10-CM

## 2015-04-20 DIAGNOSIS — R002 Palpitations: Secondary | ICD-10-CM

## 2015-04-20 DIAGNOSIS — E785 Hyperlipidemia, unspecified: Secondary | ICD-10-CM | POA: Diagnosis not present

## 2015-04-20 DIAGNOSIS — I10 Essential (primary) hypertension: Secondary | ICD-10-CM

## 2015-04-20 DIAGNOSIS — K219 Gastro-esophageal reflux disease without esophagitis: Secondary | ICD-10-CM

## 2015-04-20 DIAGNOSIS — Z0000 Encounter for general adult medical examination without abnormal findings: Secondary | ICD-10-CM | POA: Diagnosis not present

## 2015-04-20 LAB — CBC
HCT: 40 % (ref 36.0–46.0)
Hemoglobin: 12.9 g/dL (ref 12.0–15.0)
MCHC: 32.4 g/dL (ref 30.0–36.0)
MCV: 86 fl (ref 78.0–100.0)
Platelets: 208 10*3/uL (ref 150.0–400.0)
RBC: 4.65 Mil/uL (ref 3.87–5.11)
RDW: 13.4 % (ref 11.5–15.5)
WBC: 7.7 10*3/uL (ref 4.0–10.5)

## 2015-04-20 LAB — LIPID PANEL
Cholesterol: 222 mg/dL — ABNORMAL HIGH (ref 0–200)
HDL: 49.3 mg/dL
LDL Cholesterol: 154 mg/dL — ABNORMAL HIGH (ref 0–99)
NonHDL: 173.02
Total CHOL/HDL Ratio: 5
Triglycerides: 95 mg/dL (ref 0.0–149.0)
VLDL: 19 mg/dL (ref 0.0–40.0)

## 2015-04-20 LAB — COMPREHENSIVE METABOLIC PANEL WITH GFR
ALT: 20 U/L (ref 0–35)
AST: 18 U/L (ref 0–37)
Albumin: 4.4 g/dL (ref 3.5–5.2)
Alkaline Phosphatase: 65 U/L (ref 39–117)
BUN: 17 mg/dL (ref 6–23)
CO2: 29 meq/L (ref 19–32)
Calcium: 10.3 mg/dL (ref 8.4–10.5)
Chloride: 101 meq/L (ref 96–112)
Creatinine, Ser: 0.82 mg/dL (ref 0.40–1.20)
GFR: 74.88 mL/min
Glucose, Bld: 93 mg/dL (ref 70–99)
Potassium: 3.7 meq/L (ref 3.5–5.1)
Sodium: 139 meq/L (ref 135–145)
Total Bilirubin: 0.4 mg/dL (ref 0.2–1.2)
Total Protein: 7.5 g/dL (ref 6.0–8.3)

## 2015-04-20 LAB — TSH: TSH: 1.56 u[IU]/mL (ref 0.35–4.50)

## 2015-04-20 LAB — VITAMIN D 25 HYDROXY (VIT D DEFICIENCY, FRACTURES): VITD: 41.58 ng/mL (ref 30.00–100.00)

## 2015-04-20 MED ORDER — HYDROCHLOROTHIAZIDE 12.5 MG PO CAPS: 12.5000 mg | ORAL_CAPSULE | Freq: Every day | ORAL | Status: DC

## 2015-04-20 MED ORDER — BISOPROLOL FUMARATE 10 MG PO TABS: 10.0000 mg | ORAL_TABLET | Freq: Every day | ORAL | Status: DC

## 2015-04-20 NOTE — Progress Notes (Signed)
Pre visit review using our clinic review tool, if applicable. No additional management support is needed unless otherwise documented below in the visit note. 

## 2015-04-20 NOTE — Patient Instructions (Signed)
Curcumen daily, krill oil and Vitamin D 2000 IU and probiotics 10 strain  [email or web address]  Food Choices for Gastroesophageal Reflux Disease, Adult When you have gastroesophageal reflux disease (GERD), the foods you eat and your eating habits are very important. Choosing the right foods can help ease your discomfort.  WHAT GUIDELINES DO I NEED TO FOLLOW?   Choose fruits, vegetables, whole grains, and low-fat dairy products.   Choose low-fat meat, fish, and poultry.  Limit fats such as oils, salad dressings, butter, nuts, and avocado.   Keep a food diary. This helps you identify foods that cause symptoms.   Avoid foods that cause symptoms. These may be different for everyone.   Eat small meals often instead of 3 large meals a day.   Eat your meals slowly, in a place where you are relaxed.   Limit fried foods.   Cook foods using methods other than frying.   Avoid drinking alcohol.   Avoid drinking large amounts of liquids with your meals.   Avoid bending over or lying down until 2-3 hours after eating.  WHAT FOODS ARE NOT RECOMMENDED?  These are some foods and drinks that may make your symptoms worse: Vegetables Tomatoes. Tomato juice. Tomato and spaghetti sauce. Chili peppers. Onion and garlic. Horseradish. Fruits Oranges, grapefruit, and lemon (fruit and juice). Meats High-fat meats, fish, and poultry. This includes hot dogs, ribs, ham, sausage, salami, and bacon. Dairy Whole milk and chocolate milk. Sour cream. Cream. Butter. Ice cream. Cream cheese.  Drinks Coffee and tea. Bubbly (carbonated) drinks or energy drinks. Condiments Hot sauce. Barbecue sauce.  Sweets/Desserts Chocolate and cocoa. Donuts. Peppermint and spearmint. Fats and Oils High-fat foods. This includes [place] fries and potato chips. Other Vinegar. Strong spices. This includes black pepper, white pepper, red pepper, cayenne, curry powder, cloves, ginger, and chili powder. The items listed  above may not be a complete list of foods and drinks to avoid. Contact your dietitian for more information.   This information is not intended to replace advice given to you by your health care provider. Make sure you discuss any questions you have with your health care provider.   Document Released: 11/07/2011 Document Revised: 05/29/2014 Document Reviewed: 03/12/2013 Elsevier Interactive Patient Education 2016 [place] for Adults, Female A healthy lifestyle and preventive care can promote health and wellness. Preventive health guidelines for women include the following key practices.  A routine yearly physical is a good way to check with your health care provider about your health and preventive screening. It is a chance to share any concerns and updates on your health and to receive a thorough exam.  Visit your dentist for a routine exam and preventive care every 6 months. Brush your teeth twice a day and floss once a day. Good oral hygiene prevents tooth decay and gum disease.  The frequency of eye exams is based on your age, health, family medical history, use of contact lenses, and other factors. Follow your health care provider's recommendations for frequency of eye exams.  Eat a healthy diet. Foods like vegetables, fruits, whole grains, low-fat dairy products, and lean protein foods contain the nutrients you need without too many calories. Decrease your intake of foods high in solid fats, added sugars, and salt. Eat the right amount of calories for you.Get information about a proper diet from your health care provider, if necessary.  Regular physical exercise is one of the most important things you can do for your health. Most adults  should get at least 150 minutes of moderate-intensity exercise (any activity that increases your heart rate and causes you to sweat) each week. In addition, most adults need muscle-strengthening exercises on 2 or more days a  week.  Maintain a healthy weight. The body mass index (BMI) is a screening tool to identify possible weight problems. It provides an estimate of body fat based on height and weight. Your health care provider can find your BMI and can help you achieve or maintain a healthy weight.For adults 20 years and older:  A BMI below 18.5 is considered underweight.  A BMI of 18.5 to 24.9 is normal.  A BMI of 25 to 29.9 is considered overweight.  A BMI of 30 and above is considered obese.  Maintain normal blood lipids and cholesterol levels by exercising and minimizing your intake of saturated fat. Eat a balanced diet with plenty of fruit and vegetables. Blood tests for lipids and cholesterol should begin at age 28 and be repeated every 5 years. If your lipid or cholesterol levels are high, you are over 50, or you are at high risk for heart disease, you may need your cholesterol levels checked more frequently.Ongoing high lipid and cholesterol levels should be treated with medicines if diet and exercise are not working.  If you smoke, find out from your health care provider how to quit. If you do not use tobacco, do not start.  Lung cancer screening is recommended for adults aged 30-80 years who are at high risk for developing lung cancer because of a history of smoking. A yearly low-dose CT scan of the lungs is recommended for people who have at least a 30-pack-year history of smoking and are a current smoker or have quit within the past 15 years. A pack year of smoking is smoking an average of 1 pack of cigarettes a day for 1 year (for example: 1 pack a day for 30 years or 2 packs a day for 15 years). Yearly screening should continue until the smoker has stopped smoking for at least 15 years. Yearly screening should be stopped for people who develop a health problem that would prevent them from having lung cancer treatment.  If you are pregnant, do not drink alcohol. If you are breastfeeding, be very  cautious about drinking alcohol. If you are not pregnant and choose to drink alcohol, do not have more than 1 drink per day. One drink is considered to be 12 ounces (355 mL) of beer, 5 ounces (148 mL) of wine, or 1.5 ounces (44 mL) of liquor.  Avoid use of street drugs. Do not share needles with anyone. Ask for help if you need support or instructions about stopping the use of drugs.  High blood pressure causes heart disease and increases the risk of stroke. Your blood pressure should be checked at least every 1 to 2 years. Ongoing high blood pressure should be treated with medicines if weight loss and exercise do not work.  If you are 5-73 years old, ask your health care provider if you should take aspirin to prevent strokes.  Diabetes screening is done by taking a blood sample to check your blood glucose level after you have not eaten for a certain period of time (fasting). If you are not overweight and you do not have risk factors for diabetes, you should be screened once every 3 years starting at age 54. If you are overweight or obese and you are 8-79 years of age, you should be screened  for diabetes every year as part of your cardiovascular risk assessment.  Breast cancer screening is essential preventive care for women. You should practice "breast self-awareness." This means understanding the normal appearance and feel of your breasts and may include breast self-examination. Any changes detected, no matter how small, should be reported to a health care provider. Women in their 53s and 30s should have a clinical breast exam (CBE) by a health care provider as part of a regular health exam every 1 to 3 years. After age 28, women should have a CBE every year. Starting at age 9, women should consider having a mammogram (breast X-ray test) every year. Women who have a family history of breast cancer should talk to their health care provider about genetic screening. Women at a high risk of breast cancer  should talk to their health care providers about having an MRI and a mammogram every year.  Breast cancer gene (BRCA)-related cancer risk assessment is recommended for women who have family members with BRCA-related cancers. BRCA-related cancers include breast, ovarian, tubal, and peritoneal cancers. Having family members with these cancers may be associated with an increased risk for harmful changes (mutations) in the breast cancer genes BRCA1 and BRCA2. Results of the assessment will determine the need for genetic counseling and BRCA1 and BRCA2 testing.  Your health care provider may recommend that you be screened regularly for cancer of the pelvic organs (ovaries, uterus, and vagina). This screening involves a pelvic examination, including checking for microscopic changes to the surface of your cervix (Pap test). You may be encouraged to have this screening done every 3 years, beginning at age 76.  For women ages 56-65, health care providers may recommend pelvic exams and Pap testing every 3 years, or they may recommend the Pap and pelvic exam, combined with testing for human papilloma virus (HPV), every 5 years. Some types of HPV increase your risk of cervical cancer. Testing for HPV may also be done on women of any age with unclear Pap test results.  Other health care providers may not recommend any screening for nonpregnant women who are considered low risk for pelvic cancer and who do not have symptoms. Ask your health care provider if a screening pelvic exam is right for you.  If you have had past treatment for cervical cancer or a condition that could lead to cancer, you need Pap tests and screening for cancer for at least 20 years after your treatment. If Pap tests have been discontinued, your risk factors (such as having a new sexual partner) need to be reassessed to determine if screening should resume. Some women have medical problems that increase the chance of getting cervical cancer. In  these cases, your health care provider may recommend more frequent screening and Pap tests.  Colorectal cancer can be detected and often prevented. Most routine colorectal cancer screening begins at the age of 80 years and continues through age 81 years. However, your health care provider may recommend screening at an earlier age if you have risk factors for colon cancer. On a yearly basis, your health care provider may provide home test kits to check for hidden blood in the stool. Use of a small camera at the end of a tube, to directly examine the colon (sigmoidoscopy or colonoscopy), can detect the earliest forms of colorectal cancer. Talk to your health care provider about this at age 8, when routine screening begins. Direct exam of the colon should be repeated every 5-10 years through age  75 years, unless early forms of precancerous polyps or small growths are found.  People who are at an increased risk for hepatitis B should be screened for this virus. You are considered at high risk for hepatitis B if:  You were born in a country where hepatitis B occurs often. Talk with your health care provider about which countries are considered high risk.  Your parents were born in a high-risk country and you have not received a shot to protect against hepatitis B (hepatitis B vaccine).  You have HIV or AIDS.  You use needles to inject street drugs.  You live with, or have sex with, someone who has hepatitis B.  You get hemodialysis treatment.  You take certain medicines for conditions like cancer, organ transplantation, and autoimmune conditions.  Hepatitis C blood testing is recommended for all people born from [birth year] through 1963 and any individual with known risks for hepatitis C.  Practice safe sex. Use condoms and avoid high-risk sexual practices to reduce the spread of sexually transmitted infections (STIs). STIs include gonorrhea, chlamydia, syphilis, trichomonas, herpes, HPV, and human  immunodeficiency virus (HIV). Herpes, HIV, and HPV are viral illnesses that have no cure. They can result in disability, cancer, and death.  You should be screened for sexually transmitted illnesses (STIs) including gonorrhea and chlamydia if:  You are sexually active and are younger than 24 years.  You are older than 24 years and your health care provider tells you that you are at risk for this type of infection.  Your sexual activity has changed since you were last screened and you are at an increased risk for chlamydia or gonorrhea. Ask your health care provider if you are at risk.  If you are at risk of being infected with HIV, it is recommended that you take a prescription medicine daily to prevent HIV infection. This is called preexposure prophylaxis (PrEP). You are considered at risk if:  You are sexually active and do not regularly use condoms or know the HIV status of your partner(s).  You take drugs by injection.  You are sexually active with a partner who has HIV.  Talk with your health care provider about whether you are at high risk of being infected with HIV. If you choose to begin PrEP, you should first be tested for HIV. You should then be tested every 3 months for as long as you are taking PrEP.  Osteoporosis is a disease in which the bones lose minerals and strength with aging. This can result in serious bone fractures or breaks. The risk of osteoporosis can be identified using a bone density scan. Women ages 62 years and over and women at risk for fractures or osteoporosis should discuss screening with their health care providers. Ask your health care provider whether you should take a calcium supplement or vitamin D to reduce the rate of osteoporosis.  Menopause can be associated with physical symptoms and risks. Hormone replacement therapy is available to decrease symptoms and risks. You should talk to your health care provider about whether hormone replacement therapy is  right for you.  Use sunscreen. Apply sunscreen liberally and repeatedly throughout the day. You should seek shade when your shadow is shorter than you. Protect yourself by wearing long sleeves, pants, a wide-brimmed hat, and sunglasses year round, whenever you are outdoors.  Once a month, do a whole body skin exam, using a mirror to look at the skin on your back. Tell your health care provider of new  moles, moles that have irregular borders, moles that are larger than a pencil eraser, or moles that have changed in shape or color.  Stay current with required vaccines (immunizations).  Influenza vaccine. All adults should be immunized every year.  Tetanus, diphtheria, and acellular pertussis (Td, Tdap) vaccine. Pregnant women should receive 1 dose of Tdap vaccine during each pregnancy. The dose should be obtained regardless of the length of time since the last dose. Immunization is preferred during the 27th-36th week of gestation. An adult who has not previously received Tdap or who does not know her vaccine status should receive 1 dose of Tdap. This initial dose should be followed by tetanus and diphtheria toxoids (Td) booster doses every 10 years. Adults with an unknown or incomplete history of completing a 3-dose immunization series with Td-containing vaccines should begin or complete a primary immunization series including a Tdap dose. Adults should receive a Td booster every 10 years.  Varicella vaccine. An adult without evidence of immunity to varicella should receive 2 doses or a second dose if she has previously received 1 dose. Pregnant females who do not have evidence of immunity should receive the first dose after pregnancy. This first dose should be obtained before leaving the health care facility. The second dose should be obtained 4-8 weeks after the first dose.  Human papillomavirus (HPV) vaccine. Females aged 13-26 years who have not received the vaccine previously should obtain the  3-dose series. The vaccine is not recommended for use in pregnant females. However, pregnancy testing is not needed before receiving a dose. If a female is found to be pregnant after receiving a dose, no treatment is needed. In that case, the remaining doses should be delayed until after the pregnancy. Immunization is recommended for any person with an immunocompromised condition through the age of 5 years if she did not get any or all doses earlier. During the 3-dose series, the second dose should be obtained 4-8 weeks after the first dose. The third dose should be obtained 24 weeks after the first dose and 16 weeks after the second dose.  Zoster vaccine. One dose is recommended for adults aged 72 years or older unless certain conditions are present.  Measles, mumps, and rubella (MMR) vaccine. Adults born before [birth year] generally are considered immune to measles and mumps. Adults born in [birth year] or later should have 1 or more doses of MMR vaccine unless there is a contraindication to the vaccine or there is laboratory evidence of immunity to each of the three diseases. A routine second dose of MMR vaccine should be obtained at least 28 days after the first dose for students attending postsecondary schools, health care workers, or international travelers. People who received inactivated measles vaccine or an unknown type of measles vaccine during 1963-1967 should receive 2 doses of MMR vaccine. People who received inactivated mumps vaccine or an unknown type of mumps vaccine before 1979 and are at high risk for mumps infection should consider immunization with 2 doses of MMR vaccine. For females of childbearing age, rubella immunity should be determined. If there is no evidence of immunity, females who are not pregnant should be vaccinated. If there is no evidence of immunity, females who are pregnant should delay immunization until after pregnancy. Unvaccinated health care workers born before [birth year] who lack  laboratory evidence of measles, mumps, or rubella immunity or laboratory confirmation of disease should consider measles and mumps immunization with 2 doses of MMR vaccine or rubella immunization with 1 dose of  MMR vaccine.  Pneumococcal 13-valent conjugate (PCV13) vaccine. When indicated, a person who is uncertain of his immunization history and has no record of immunization should receive the PCV13 vaccine. All adults 69 years of age and older should receive this vaccine. An adult aged 78 years or older who has certain medical conditions and has not been previously immunized should receive 1 dose of PCV13 vaccine. This PCV13 should be followed with a dose of pneumococcal polysaccharide (PPSV23) vaccine. Adults who are at high risk for pneumococcal disease should obtain the PPSV23 vaccine at least 8 weeks after the dose of PCV13 vaccine. Adults older than 62 years of age who have normal immune system function should obtain the PPSV23 vaccine dose at least 1 year after the dose of PCV13 vaccine.  Pneumococcal polysaccharide (PPSV23) vaccine. When PCV13 is also indicated, PCV13 should be obtained first. All adults aged 79 years and older should be immunized. An adult younger than age 77 years who has certain medical conditions should be immunized. Any person who resides in a nursing home or long-term care facility should be immunized. An adult smoker should be immunized. People with an immunocompromised condition and certain other conditions should receive both PCV13 and PPSV23 vaccines. People with human immunodeficiency virus (HIV) infection should be immunized as soon as possible after diagnosis. Immunization during chemotherapy or radiation therapy should be avoided. Routine use of PPSV23 vaccine is not recommended for [place] Indians, [place] Natives, or people younger than 65 years unless there are medical conditions that require PPSV23 vaccine. When indicated, people who have unknown immunization and have  no record of immunization should receive PPSV23 vaccine. One-time revaccination 5 years after the first dose of PPSV23 is recommended for people aged 19-64 years who have chronic kidney failure, nephrotic syndrome, asplenia, or immunocompromised conditions. People who received 1-2 doses of PPSV23 before age 80 years should receive another dose of PPSV23 vaccine at age 16 years or later if at least 5 years have passed since the previous dose. Doses of PPSV23 are not needed for people immunized with PPSV23 at or after age 10 years.  Meningococcal vaccine. Adults with asplenia or persistent complement component deficiencies should receive 2 doses of quadrivalent meningococcal conjugate (MenACWY-D) vaccine. The doses should be obtained at least 2 months apart. Microbiologists working with certain meningococcal bacteria, [employer] recruits, people at risk during an outbreak, and people who travel to or live in countries with a high rate of meningitis should be immunized. A first-year college student up through age 56 years who is living in a residence hall should receive a dose if she did not receive a dose on or after her 16th birthday. Adults who have certain high-risk conditions should receive one or more doses of vaccine.  Hepatitis A vaccine. Adults who wish to be protected from this disease, have certain high-risk conditions, work with hepatitis A-infected animals, work in hepatitis A research labs, or travel to or work in countries with a high rate of hepatitis A should be immunized. Adults who were previously unvaccinated and who anticipate close contact with an international adoptee during the first 60 days after arrival in the [place] from a country with a high rate of hepatitis A should be immunized.  Hepatitis B vaccine. Adults who wish to be protected from this disease, have certain high-risk conditions, may be exposed to blood or other infectious body fluids, are household contacts or sex  partners of hepatitis B positive people, are clients or workers in certain care  facilities, or travel to or work in countries with a high rate of hepatitis B should be immunized.  Haemophilus influenzae type b (Hib) vaccine. A previously unvaccinated person with asplenia or sickle cell disease or having a scheduled splenectomy should receive 1 dose of Hib vaccine. Regardless of previous immunization, a recipient of a hematopoietic stem cell transplant should receive a 3-dose series 6-12 months after her successful transplant. Hib vaccine is not recommended for adults with HIV infection. Preventive Services / Frequency Ages 53 to 9 years  Blood pressure check.** / Every 3-5 years.  Lipid and cholesterol check.** / Every 5 years beginning at age 64.  Clinical breast exam.** / Every 3 years for women in their 52s and 76s.  BRCA-related cancer risk assessment.** / For women who have family members with a BRCA-related cancer (breast, ovarian, tubal, or peritoneal cancers).  Pap test.** / Every 2 years from ages 44 through 76. Every 3 years starting at age 3 through age 87 or 71 with a history of 3 consecutive normal Pap tests.  HPV screening.** / Every 3 years from ages 18 through ages 54 to 79 with a history of 3 consecutive normal Pap tests.  Hepatitis C blood test.** / For any individual with known risks for hepatitis C.  Skin self-exam. / Monthly.  Influenza vaccine. / Every year.  Tetanus, diphtheria, and acellular pertussis (Tdap, Td) vaccine.** / Consult your health care provider. Pregnant women should receive 1 dose of Tdap vaccine during each pregnancy. 1 dose of Td every 10 years.  Varicella vaccine.** / Consult your health care provider. Pregnant females who do not have evidence of immunity should receive the first dose after pregnancy.  HPV vaccine. / 3 doses over 6 months, if 48 and younger. The vaccine is not recommended for use in pregnant females. However, pregnancy testing  is not needed before receiving a dose.  Measles, mumps, rubella (MMR) vaccine.** / You need at least 1 dose of MMR if you were born in 1957 or later. You may also need a 2nd dose. For females of childbearing age, rubella immunity should be determined. If there is no evidence of immunity, females who are not pregnant should be vaccinated. If there is no evidence of immunity, females who are pregnant should delay immunization until after pregnancy.  Pneumococcal 13-valent conjugate (PCV13) vaccine.** / Consult your health care provider.  Pneumococcal polysaccharide (PPSV23) vaccine.** / 1 to 2 doses if you smoke cigarettes or if you have certain conditions.  Meningococcal vaccine.** / 1 dose if you are age 18 to 54 years and a Market researcher living in a residence hall, or have one of several medical conditions, you need to get vaccinated against meningococcal disease. You may also need additional booster doses.  Hepatitis A vaccine.** / Consult your health care provider.  Hepatitis B vaccine.** / Consult your health care provider.  Haemophilus influenzae type b (Hib) vaccine.** / Consult your health care provider. Ages 79 to 17 years  Blood pressure check.** / Every year.  Lipid and cholesterol check.** / Every 5 years beginning at age 54 years.  Lung cancer screening. / Every year if you are aged 44-80 years and have a 30-pack-year history of smoking and currently smoke or have quit within the past 15 years. Yearly screening is stopped once you have quit smoking for at least 15 years or develop a health problem that would prevent you from having lung cancer treatment.  Clinical breast exam.** / Every year after age  40 years.  BRCA-related cancer risk assessment.** / For women who have family members with a BRCA-related cancer (breast, ovarian, tubal, or peritoneal cancers).  Mammogram.** / Every year beginning at age 59 years and continuing for as long as you are in good  health. Consult with your health care provider.  Pap test.** / Every 3 years starting at age 17 years through age 35 or 59 years with a history of 3 consecutive normal Pap tests.  HPV screening.** / Every 3 years from ages 81 years through ages 42 to 16 years with a history of 3 consecutive normal Pap tests.  Fecal occult blood test (FOBT) of stool. / Every year beginning at age 46 years and continuing until age 49 years. You may not need to do this test if you get a colonoscopy every 10 years.  Flexible sigmoidoscopy or colonoscopy.** / Every 5 years for a flexible sigmoidoscopy or every 10 years for a colonoscopy beginning at age 18 years and continuing until age 62 years.  Hepatitis C blood test.** / For all people born from [birth year] through 1965 and any individual with known risks for hepatitis C.  Skin self-exam. / Monthly.  Influenza vaccine. / Every year.  Tetanus, diphtheria, and acellular pertussis (Tdap/Td) vaccine.** / Consult your health care provider. Pregnant women should receive 1 dose of Tdap vaccine during each pregnancy. 1 dose of Td every 10 years.  Varicella vaccine.** / Consult your health care provider. Pregnant females who do not have evidence of immunity should receive the first dose after pregnancy.  Zoster vaccine.** / 1 dose for adults aged 63 years or older.  Measles, mumps, rubella (MMR) vaccine.** / You need at least 1 dose of MMR if you were born in 1957 or later. You may also need a second dose. For females of childbearing age, rubella immunity should be determined. If there is no evidence of immunity, females who are not pregnant should be vaccinated. If there is no evidence of immunity, females who are pregnant should delay immunization until after pregnancy.  Pneumococcal 13-valent conjugate (PCV13) vaccine.** / Consult your health care provider.  Pneumococcal polysaccharide (PPSV23) vaccine.** / 1 to 2 doses if you smoke cigarettes or if you have certain  conditions.  Meningococcal vaccine.** / Consult your health care provider.  Hepatitis A vaccine.** / Consult your health care provider.  Hepatitis B vaccine.** / Consult your health care provider.  Haemophilus influenzae type b (Hib) vaccine.** / Consult your health care provider. Ages 49 years and over  Blood pressure check.** / Every year.  Lipid and cholesterol check.** / Every 5 years beginning at age 83 years.  Lung cancer screening. / Every year if you are aged 85-80 years and have a 30-pack-year history of smoking and currently smoke or have quit within the past 15 years. Yearly screening is stopped once you have quit smoking for at least 15 years or develop a health problem that would prevent you from having lung cancer treatment.  Clinical breast exam.** / Every year after age 60 years.  BRCA-related cancer risk assessment.** / For women who have family members with a BRCA-related cancer (breast, ovarian, tubal, or peritoneal cancers).  Mammogram.** / Every year beginning at age 23 years and continuing for as long as you are in good health. Consult with your health care provider.  Pap test.** / Every 3 years starting at age 17 years through age 48 or 37 years with 3 consecutive normal Pap tests. Testing can be stopped between  65 and 70 years with 3 consecutive normal Pap tests and no abnormal Pap or HPV tests in the past 10 years.  HPV screening.** / Every 3 years from ages 34 years through ages 13 or 59 years with a history of 3 consecutive normal Pap tests. Testing can be stopped between 65 and 70 years with 3 consecutive normal Pap tests and no abnormal Pap or HPV tests in the past 10 years.  Fecal occult blood test (FOBT) of stool. / Every year beginning at age 57 years and continuing until age 68 years. You may not need to do this test if you get a colonoscopy every 10 years.  Flexible sigmoidoscopy or colonoscopy.** / Every 5 years for a flexible sigmoidoscopy or every 10  years for a colonoscopy beginning at age 37 years and continuing until age 46 years.  Hepatitis C blood test.** / For all people born from [birth year] through 1965 and any individual with known risks for hepatitis C.  Osteoporosis screening.** / A one-time screening for women ages 29 years and over and women at risk for fractures or osteoporosis.  Skin self-exam. / Monthly.  Influenza vaccine. / Every year.  Tetanus, diphtheria, and acellular pertussis (Tdap/Td) vaccine.** / 1 dose of Td every 10 years.  Varicella vaccine.** / Consult your health care provider.  Zoster vaccine.** / 1 dose for adults aged 58 years or older.  Pneumococcal 13-valent conjugate (PCV13) vaccine.** / Consult your health care provider.  Pneumococcal polysaccharide (PPSV23) vaccine.** / 1 dose for all adults aged 63 years and older.  Meningococcal vaccine.** / Consult your health care provider.  Hepatitis A vaccine.** / Consult your health care provider.  Hepatitis B vaccine.** / Consult your health care provider.  Haemophilus influenzae type b (Hib) vaccine.** / Consult your health care provider. ** Family history and personal history of risk and conditions may change your health care provider's recommendations.   This information is not intended to replace advice given to you by your health care provider. Make sure you discuss any questions you have with your health care provider.   Document Released: 07/04/2001 Document Revised: 05/29/2014 Document Reviewed: 10/03/2010 Elsevier Interactive Patient Education 2016 [employer]. [email or web address]

## 2015-04-24 ENCOUNTER — Encounter: Payer: Self-pay | Admitting: Family Medicine

## 2015-04-24 NOTE — Assessment & Plan Note (Signed)
Well controlled, no changes to meds. Encouraged heart healthy diet such as the DASH diet and exercise as tolerated.  °

## 2015-04-24 NOTE — Assessment & Plan Note (Addendum)
Patient encouraged to maintain heart healthy diet, regular exercise, adequate sleep. Consider daily probiotics. Take medications as prescribed. Labs reviewed. Follows with Dr Bonner Puna of GYN.  Colonoscopy in 2014 with Dr Olevia Perches

## 2015-04-24 NOTE — Assessment & Plan Note (Signed)
Avoid offending foods, start probiotics. Do not eat large meals in late evening and consider raising head of bed.  

## 2015-04-24 NOTE — Assessment & Plan Note (Signed)
Recommend calcium intake of 1200 to 1500 mg daily, divided into roughly 3 doses. Best source is the diet and a single dairy serving is about 500 mg, a supplement of calcium citrate once or twice daily to balance diet is fine if not getting enough in diet. Also need Vitamin D 2000 IU caps, 1 cap daily if not already taking vitamin D. Also recommend weight baring exercise on hips and upper body to keep bones strong 

## 2015-04-24 NOTE — Assessment & Plan Note (Signed)
Encouraged heart healthy diet, increase exercise, avoid trans fats, consider a krill oil cap daily. May continue Red Yeast Rice

## 2015-04-24 NOTE — Progress Notes (Signed)
Subjective:    Patient ID: Terri Gay, female    DOB: January 10, 1951, 64 y.o.   MRN: [id number]  Chief Complaint  Patient presents with  . Annual Exam    HPI Patient is in today for annual exam. For the most part doing well. Is having trouble with heartburn recently. the biggest trouble is at night especially after overeating. Uses Gaviscon sometimes and baking soda both of which did give her some relief. No other acute complaints. Agrees to flu shot today. Trying to maintain a heart healthy diet. Denies CP/palp/SOB/HA/congestion/fevers/GI or GU c/o. Taking meds as prescribed  Past Medical History  Diagnosis Date  . Hypertension   . Allergy     seasonal  . Diverticulosis 10/07/2010    Colonoscopy  . Arthritis   . Depression   . Hyperlipidemia   . Anxiety   . History of chicken pox   . Hemorrhoids   . GERD (gastroesophageal reflux disease)   . Osteoporosis   . Osteopenia 04/25/2013  . Palpitations 04/26/2013  . Preventative health care 04/26/2013  . Knee pain, left 10/24/2013    Past Surgical History  Procedure Laterality Date  . No past surgeries      Family History  Problem Relation Age of Onset  . Lung cancer Father   . Cancer Father     lung  . Lung cancer Paternal Grandfather   . Cancer Paternal Grandfather     lung  . Diabetes Mother   . Arthritis Mother   . Hyperlipidemia Mother   . Hypertension Mother   . Heart disease Mother     CHF  . Diabetes Maternal Grandmother   . Heart disease Maternal Grandmother   . Arthritis Maternal Grandmother   . Hyperlipidemia Maternal Grandmother   . Hypertension Maternal Grandmother   . Hyperlipidemia Sister   . Arthritis Paternal Grandmother   . Colon cancer Neg Hx   . Esophageal cancer Neg Hx   . Rectal cancer Neg Hx   . Stomach cancer Neg Hx   . Heart disease Daughter     arrythmia, Internal Cardiac Defibrillator  . Lupus Son     Social History   Social History  . Marital Status: Married    Spouse Name: N/A    . Number of Children: 2  . Years of Education: N/A   Occupational History  . Customer Service    Social History Main Topics  . Smoking status: Never Smoker   . Smokeless tobacco: Never Used  . Alcohol Use: 0.6 oz/week    1 Glasses of wine per week     Comment: occasionally  . Drug Use: No  . Sexual Activity: Not on file   Other Topics Concern  . Not on file   Social History Narrative   Caffeine Use: 1 cup coffee and 1 cup tea          Outpatient Prescriptions Prior to Visit  Medication Sig Dispense Refill  . Calcium Carbonate (CALCIUM 600 PO) Take 1,200 mg by mouth daily.    . Cholecalciferol (VITAMIN D-3) 5000 UNITS TABS Take 5,000 Units by mouth daily.     Marland Kitchen estradiol (ESTRACE) 0.1 MG/GM vaginal cream Place 2 g vaginally 3 (three) times a week.    . hydrocortisone (ANUSOL-HC) 25 MG suppository Use 1 suppository at bedtime for 10 days. 10 suppository 1  . ibuprofen (ADVIL,MOTRIN) 200 MG tablet Take 600 mg by mouth daily as needed. For pain    . magnesium gluconate (MAGONATE)  500 MG tablet Take 500 mg by mouth daily.    . Omega-3 Fatty Acids (FISH OIL) 1200 MG CAPS Take 1,200 mg by mouth daily.    . Probiotic Product (PROBIOTIC DAILY) CAPS Take 1 capsule by mouth daily.    . progesterone (PROMETRIUM) 100 MG capsule Take 100 mg by mouth at bedtime.      . RED YEAST RICE EXTRACT PO Take 4 tablets by mouth at bedtime.    . temazepam (RESTORIL) 30 MG capsule Take 30 mg by mouth at bedtime as needed for sleep.    Marland Kitchen venlafaxine XR (EFFEXOR-XR) 75 MG 24 hr capsule Take 75 mg by mouth daily.    . bisoprolol (ZEBETA) 10 MG tablet Take 1 tablet (10 mg total) by mouth daily. 30 tablet 5  . hydrochlorothiazide (MICROZIDE) 12.5 MG capsule Take 1 capsule (12.5 mg total) by mouth daily. 30 capsule 5   No facility-administered medications prior to visit.    Allergies  Allergen Reactions  . Amoxicillin Diarrhea    Review of Systems  Constitutional: Negative for fever, chills and  malaise/fatigue.  HENT: Negative for congestion and hearing loss.   Eyes: Negative for discharge.  Respiratory: Negative for cough, sputum production and shortness of breath.   Cardiovascular: Negative for chest pain, palpitations and leg swelling.  Gastrointestinal: Positive for heartburn. Negative for nausea, vomiting, abdominal pain, diarrhea, constipation and blood in stool.  Genitourinary: Negative for dysuria, urgency, frequency and hematuria.  Musculoskeletal: Negative for myalgias, back pain and falls.  Skin: Negative for rash.  Neurological: Negative for dizziness, sensory change, loss of consciousness, weakness and headaches.  Endo/Heme/Allergies: Negative for environmental allergies. Does not bruise/bleed easily.  Psychiatric/Behavioral: Negative for depression and suicidal ideas. The patient is not nervous/anxious and does not have insomnia.        Objective:    Physical Exam  Constitutional: She is oriented to person, place, and time. She appears well-developed and well-nourished. No distress.  HENT:  Head: Normocephalic and atraumatic.  Eyes: Conjunctivae are normal.  Neck: Neck supple. No thyromegaly present.  Cardiovascular: Normal rate, regular rhythm and normal heart sounds.   No murmur heard. Pulmonary/Chest: Effort normal and breath sounds normal. No respiratory distress.  Abdominal: Soft. Bowel sounds are normal. She exhibits no distension and no mass. There is no tenderness.  Musculoskeletal: She exhibits no edema.  Lymphadenopathy:    She has no cervical adenopathy.  Neurological: She is alert and oriented to person, place, and time.  Skin: Skin is warm and dry.  Psychiatric: She has a normal mood and affect. Her behavior is normal.    BP 118/80 mmHg  Pulse 77  Temp(Src) 98.6 F (37 C) (Oral)  Ht 5\' 2"  (1.575 m)  Wt 150 lb (68.04 kg)  BMI 27.43 kg/m2  SpO2 97% Wt Readings from Last 3 Encounters:  04/20/15 150 lb (68.04 kg)  10/16/14 147 lb 2 oz  (66.735 kg)  10/24/13 144 lb (65.318 kg)     Lab Results  Component Value Date   WBC 7.7 04/20/2015   HGB 12.9 04/20/2015   HCT 40.0 04/20/2015   PLT 208.0 04/20/2015   GLUCOSE 93 04/20/2015   CHOL 222* 04/20/2015   TRIG 95.0 04/20/2015   HDL 49.30 04/20/2015   LDLCALC 154* 04/20/2015   ALT 20 04/20/2015   AST 18 04/20/2015   NA 139 04/20/2015   K 3.7 04/20/2015   CL 101 04/20/2015   CREATININE 0.82 04/20/2015   BUN 17 04/20/2015   CO2  29 04/20/2015   TSH 1.56 04/20/2015    Lab Results  Component Value Date   TSH 1.56 04/20/2015   Lab Results  Component Value Date   WBC 7.7 04/20/2015   HGB 12.9 04/20/2015   HCT 40.0 04/20/2015   MCV 86.0 04/20/2015   PLT 208.0 04/20/2015   Lab Results  Component Value Date   NA 139 04/20/2015   K 3.7 04/20/2015   CO2 29 04/20/2015   GLUCOSE 93 04/20/2015   BUN 17 04/20/2015   CREATININE 0.82 04/20/2015   BILITOT 0.4 04/20/2015   ALKPHOS 65 04/20/2015   AST 18 04/20/2015   ALT 20 04/20/2015   PROT 7.5 04/20/2015   ALBUMIN 4.4 04/20/2015   CALCIUM 10.3 04/20/2015   GFR 74.88 04/20/2015   Lab Results  Component Value Date   CHOL 222* 04/20/2015   Lab Results  Component Value Date   HDL 49.30 04/20/2015   Lab Results  Component Value Date   LDLCALC 154* 04/20/2015   Lab Results  Component Value Date   TRIG 95.0 04/20/2015   Lab Results  Component Value Date   CHOLHDL 5 04/20/2015   No results found for: HGBA1C     Assessment & Plan:   Problem List Items Addressed This Visit    GERD (gastroesophageal reflux disease)    Avoid offending foods, start probiotics. Do not eat large meals in late evening and consider raising head of bed.       Relevant Orders   CBC (Completed)   TSH (Completed)   Comprehensive metabolic panel (Completed)   Lipid panel (Completed)   Vitamin D (25 hydroxy) (Completed)   HTN (hypertension)    Well controlled, no changes to meds. Encouraged heart healthy diet such as the  DASH diet and exercise as tolerated.       Relevant Medications   hydrochlorothiazide (MICROZIDE) 12.5 MG capsule   bisoprolol (ZEBETA) 10 MG tablet   Other Relevant Orders   CBC (Completed)   TSH (Completed)   Comprehensive metabolic panel (Completed)   Lipid panel (Completed)   Vitamin D (25 hydroxy) (Completed)   Hyperlipidemia    Encouraged heart healthy diet, increase exercise, avoid trans fats, consider a krill oil cap daily. May continue Red Yeast Rice      Relevant Medications   hydrochlorothiazide (MICROZIDE) 12.5 MG capsule   bisoprolol (ZEBETA) 10 MG tablet   Other Relevant Orders   CBC (Completed)   TSH (Completed)   Comprehensive metabolic panel (Completed)   Lipid panel (Completed)   Vitamin D (25 hydroxy) (Completed)   Osteopenia    . Recommend calcium intake of 1200 to 1500 mg daily, divided into roughly 3 doses. Best source is the diet and a single dairy serving is about 500 mg, a supplement of calcium citrate once or twice daily to balance diet is fine if not getting enough in diet. Also need Vitamin D 2000 IU caps, 1 cap daily if not already taking vitamin D. Also recommend weight baring exercise on hips and upper body to keep bones strong      Relevant Orders   CBC (Completed)   TSH (Completed)   Comprehensive metabolic panel (Completed)   Lipid panel (Completed)   Vitamin D (25 hydroxy) (Completed)   Palpitations   Relevant Orders   CBC (Completed)   TSH (Completed)   Comprehensive metabolic panel (Completed)   Lipid panel (Completed)   Vitamin D (25 hydroxy) (Completed)   Preventative health care    Patient  encouraged to maintain heart healthy diet, regular exercise, adequate sleep. Consider daily probiotics. Take medications as prescribed. Labs reviewed. Follows with Dr Bonner Puna of GYN.  Colonoscopy in 2014 with Dr Olevia Perches        Other Visit Diagnoses    Encounter for immunization    -  Primary       I am having Ms. Goforth maintain her Vitamin  D-3, progesterone, ibuprofen, RED YEAST RICE EXTRACT PO, magnesium gluconate, Calcium Carbonate (CALCIUM 600 PO), Fish Oil, estradiol, PROBIOTIC DAILY, temazepam, hydrocortisone, venlafaxine XR, hydrochlorothiazide, and bisoprolol.  Meds ordered this encounter  Medications  . hydrochlorothiazide (MICROZIDE) 12.5 MG capsule    Sig: Take 1 capsule (12.5 mg total) by mouth daily.    Dispense:  90 capsule    Refill:  2  . bisoprolol (ZEBETA) 10 MG tablet    Sig: Take 1 tablet (10 mg total) by mouth daily.    Dispense:  90 tablet    Refill:  2     Penni Homans, MD

## 2015-12-21 DIAGNOSIS — Z6826 Body mass index (BMI) 26.0-26.9, adult: Secondary | ICD-10-CM | POA: Diagnosis not present

## 2015-12-21 DIAGNOSIS — Z01419 Encounter for gynecological examination (general) (routine) without abnormal findings: Secondary | ICD-10-CM | POA: Diagnosis not present

## 2015-12-28 ENCOUNTER — Telehealth: Payer: Self-pay | Admitting: Family Medicine

## 2015-12-28 NOTE — Telephone Encounter (Signed)
Please Advise

## 2015-12-28 NOTE — Telephone Encounter (Signed)
° °  Pt call to ask if her and her husband can transfer to [facility] from [facility]

## 2015-12-29 NOTE — Telephone Encounter (Signed)
It is ok with me if it is ok with Erline Levine

## 2015-12-29 NOTE — Telephone Encounter (Signed)
OK with me just clarify who the patient is and get noted in correct chart.

## 2015-12-31 NOTE — Telephone Encounter (Signed)
pts have been scheduled

## 2016-01-31 ENCOUNTER — Encounter: Payer: Self-pay | Admitting: Adult Health

## 2016-01-31 ENCOUNTER — Ambulatory Visit (INDEPENDENT_AMBULATORY_CARE_PROVIDER_SITE_OTHER): Payer: BLUE CROSS/BLUE SHIELD | Admitting: Adult Health

## 2016-01-31 VITALS — BP 132/78 | Temp 98.6°F | Wt 146.9 lb

## 2016-01-31 DIAGNOSIS — I10 Essential (primary) hypertension: Secondary | ICD-10-CM | POA: Diagnosis not present

## 2016-01-31 DIAGNOSIS — Z7189 Other specified counseling: Secondary | ICD-10-CM | POA: Diagnosis not present

## 2016-01-31 DIAGNOSIS — Z23 Encounter for immunization: Secondary | ICD-10-CM

## 2016-01-31 DIAGNOSIS — Z7689 Persons encountering health services in other specified circumstances: Secondary | ICD-10-CM

## 2016-01-31 DIAGNOSIS — F418 Other specified anxiety disorders: Secondary | ICD-10-CM | POA: Diagnosis not present

## 2016-01-31 MED ORDER — BISOPROLOL FUMARATE 10 MG PO TABS: 10.0000 mg | ORAL_TABLET | Freq: Every day | ORAL | 3 refills | Status: DC

## 2016-01-31 MED ORDER — HYDROCHLOROTHIAZIDE 12.5 MG PO CAPS: 12.5000 mg | ORAL_CAPSULE | Freq: Every day | ORAL | 3 refills | Status: DC

## 2016-01-31 NOTE — Progress Notes (Addendum)
Patient presents to clinic today to establish care. She is a pleasant 65 year old female who  has a past medical history of Allergy; Anxiety; Arthritis; Depression; Diverticulosis (10/07/2010); GERD (gastroesophageal reflux disease); Hemorrhoids; History of chicken pox; Hyperlipidemia; Hypertension; Knee pain, left (10/24/2013); Osteopenia (04/25/2013); Osteoporosis; Palpitations (04/26/2013); and Preventative health care (04/26/2013).  Her last physical was on 04/20/2015 with MD Terri Gay   Acute Concerns: Establish Care   Chronic Issues: Depression  - Feels as though it is well controlled on Effexor 75 mg   Hypertension  - Well controlled on current medication  Health Maintenance: Dental -- Routine Care - Dr. Scotty Gay in [place] -- Routine Care - Dr. Dorena Gay  Immunizations -- UTD  Colonoscopy -- 2012  Mammogram -- Is due this year  PAP -- 2017  - Normal  Bone Density -- Done by GYN  Diet: Does not follow a diabetic diet  Exercise: Does not exercise  Is followed by:  Terri Gay - [facility]    Past Medical History:  Diagnosis Date  . Allergy    seasonal  . Anxiety   . Arthritis   . Depression   . Diverticulosis 10/07/2010   Colonoscopy  . GERD (gastroesophageal reflux disease)   . Hemorrhoids   . History of chicken pox   . Hyperlipidemia   . Hypertension   . Knee pain, left 10/24/2013  . Osteopenia 04/25/2013  . Osteoporosis   . Palpitations 04/26/2013  . Preventative health care 04/26/2013    Past Surgical History:  Procedure Laterality Date  . NO PAST SURGERIES      Current Outpatient Prescriptions on File Prior to Visit  Medication Sig Dispense Refill  . Calcium Carbonate (CALCIUM 600 PO) Take 1,200 mg by mouth daily.    . Cholecalciferol (VITAMIN D-3) 5000 UNITS TABS Take 5,000 Units by mouth daily.     Marland Kitchen estradiol (ESTRACE) 0.1 MG/GM vaginal cream Place 2 g vaginally 3 (three) times a week.    . hydrocortisone (ANUSOL-HC) 25 MG  suppository Use 1 suppository at bedtime for 10 days. 10 suppository 1  . ibuprofen (ADVIL,MOTRIN) 200 MG tablet Take 600 mg by mouth daily as needed. For pain    . magnesium gluconate (MAGONATE) 500 MG tablet Take 500 mg by mouth daily.    . Omega-3 Fatty Acids (FISH OIL) 1200 MG CAPS Take 1,200 mg by mouth daily.    . Probiotic Product (PROBIOTIC DAILY) CAPS Take 1 capsule by mouth daily.    . progesterone (PROMETRIUM) 100 MG capsule Take 100 mg by mouth at bedtime.      . RED YEAST RICE EXTRACT PO Take 4 tablets by mouth at bedtime.    . temazepam (RESTORIL) 30 MG capsule Take 30 mg by mouth at bedtime as needed for sleep.    Marland Kitchen venlafaxine XR (EFFEXOR-XR) 75 MG 24 hr capsule Take 75 mg by mouth daily.     No current facility-administered medications on file prior to visit.     Allergies  Allergen Reactions  . Amoxicillin Diarrhea    Family History  Problem Relation Age of Onset  . Lung cancer Father   . Lung cancer Paternal Grandfather   . Diabetes Mother   . Arthritis Mother   . Hyperlipidemia Mother   . Hypertension Mother   . Heart disease Mother     CHF  . Diabetes Maternal Grandmother   . Heart disease Maternal Grandmother   . Arthritis Maternal Grandmother   .  Hyperlipidemia Maternal Grandmother   . Hypertension Maternal Grandmother   . Hyperlipidemia Sister   . Arthritis Paternal Grandmother   . Heart disease Daughter     arrythmia, Internal Cardiac Defibrillator  . Lupus Son   . Drug abuse Son   . Colon cancer Neg Hx   . Esophageal cancer Neg Hx   . Rectal cancer Neg Hx   . Stomach cancer Neg Hx     Social History   Social History  . Marital status: Married    Spouse name: N/A  . Number of children: 2  . Years of education: N/A   Occupational History  . Customer Service [employer]   Social History Main Topics  . Smoking status: Never Smoker  . Smokeless tobacco: Never Used  . Alcohol use 0.6 oz/week    1 Glasses of wine per week      Comment: occasionally  . Drug use: No  . Sexual activity: Not on file   Other Topics Concern  . Not on file   Social History Narrative   Retired from [employer]    Married for 27 years    Have one son and one daughter ( live in [place])       She likes to dance, read, and play with her dogs.          Caffeine Use: 1 cup coffee and 1 cup tea          Review of Systems  Constitutional: Negative.   HENT: Negative.   Eyes: Negative.   Respiratory: Negative.   Cardiovascular: Negative.   Gastrointestinal: Negative.   Genitourinary: Negative.   Musculoskeletal: Negative.   Skin: Negative.   Neurological: Negative.   Endo/Heme/Allergies: Negative.   Psychiatric/Behavioral: Positive for depression. Negative for memory loss, substance abuse and suicidal ideas. The patient is not nervous/anxious and does not have insomnia.   All other systems reviewed and are negative.   BP 132/78   Temp 98.6 F (37 C)   Wt 146 lb 14.4 oz (66.6 kg)   BMI 26.87 kg/m   Physical Exam  Constitutional: She is oriented to person, place, and time and well-developed, well-nourished, and in no distress. No distress.  HENT:  Head: Normocephalic and atraumatic.  Right Ear: External ear normal.  Left Ear: External ear normal.  Nose: Nose normal.  Mouth/Throat: Oropharynx is clear and moist. No oropharyngeal exudate.  Eyes: Conjunctivae and EOM are normal. Pupils are equal, round, and reactive to light. Right eye exhibits no discharge. Left eye exhibits no discharge. No scleral icterus.  Neck: Normal range of motion. Neck supple. No JVD present. No tracheal deviation present. No thyromegaly present.  Cardiovascular: Normal rate, regular rhythm, normal heart sounds and intact distal pulses.  Exam reveals no gallop and no friction rub.   No murmur heard. Pulmonary/Chest: Effort normal and breath sounds normal. No stridor. No respiratory distress. She has no wheezes. She has no rales. She  exhibits no tenderness.  Musculoskeletal: Normal range of motion. She exhibits no edema or tenderness.  Lymphadenopathy:    She has no cervical adenopathy.  Neurological: She is alert and oriented to person, place, and time. She displays normal reflexes. No cranial nerve deficit. She exhibits normal muscle tone. Gait normal. Coordination normal. GCS score is 15.  Skin: Skin is warm and dry. No rash noted. She is not diaphoretic. No erythema. No pallor.  Psychiatric: Mood, memory, affect and judgment normal.  Nursing note and vitals reviewed.  Assessment/Plan:  1. Encounter to establish care - Follow up for CPE - Follow up sooner if needed - Encouraged weight loss through diet and exercise.   2. Essential hypertension - Near goal. Continue to monitor - Encouraged a heart healthy diet and regular aerobic exercise - bisoprolol (ZEBETA) 10 MG tablet; Take 1 tablet (10 mg total) by mouth daily.  Dispense: 90 tablet; Refill: 3 - hydrochlorothiazide (MICROZIDE) 12.5 MG capsule; Take 1 capsule (12.5 mg total) by mouth daily.  Dispense: 90 capsule; Refill: 3  3. Depression with anxiety - No change in therapy at this time   4. Need for prophylactic vaccination and inoculation against influenza  - Flu Vaccine QUAD 36+ mos PF IM (Fluarix & Fluzone Quad PF)  Dorothyann Peng, NP

## 2016-01-31 NOTE — Patient Instructions (Addendum)
It was a pleasure meeting you today!  Please follow up in November for your physical   Work on diet and exercise to help you lose weight. Try cutting back on carbs and sugars    Health Maintenance, Female Adopting a healthy lifestyle and getting preventive care can go a long way to promote health and wellness. Talk with your health care provider about what schedule of regular examinations is right for you. This is a good chance for you to check in with your provider about disease prevention and staying healthy. In between checkups, there are plenty of things you can do on your own. Experts have done a lot of research about which lifestyle changes and preventive measures are most likely to keep you healthy. Ask your health care provider for more information. WEIGHT AND DIET  Eat a healthy diet  Be sure to include plenty of vegetables, fruits, low-fat dairy products, and lean protein.  Do not eat a lot of foods high in solid fats, added sugars, or salt.  Get regular exercise. This is one of the most important things you can do for your health.  Most adults should exercise for at least 150 minutes each week. The exercise should increase your heart rate and make you sweat (moderate-intensity exercise).  Most adults should also do strengthening exercises at least twice a week. This is in addition to the moderate-intensity exercise.  Maintain a healthy weight  Body mass index (BMI) is a measurement that can be used to identify possible weight problems. It estimates body fat based on height and weight. Your health care provider can help determine your BMI and help you achieve or maintain a healthy weight.  For females 67 years of age and older:   A BMI below 18.5 is considered underweight.  A BMI of 18.5 to 24.9 is normal.  A BMI of 25 to 29.9 is considered overweight.  A BMI of 30 and above is considered obese.  Watch levels of cholesterol and blood lipids  You should start having  your blood tested for lipids and cholesterol at 63 years of age, then have this test every 5 years.  You may need to have your cholesterol levels checked more often if:  Your lipid or cholesterol levels are high.  You are older than 63 years of age.  You are at high risk for heart disease.  CANCER SCREENING   Lung Cancer  Lung cancer screening is recommended for adults 54-23 years old who are at high risk for lung cancer because of a history of smoking.  A yearly low-dose CT scan of the lungs is recommended for people who:  Currently smoke.  Have quit within the past 15 years.  Have at least a 30-pack-year history of smoking. A pack year is smoking an average of one pack of cigarettes a day for 1 year.  Yearly screening should continue until it has been 15 years since you quit.  Yearly screening should stop if you develop a health problem that would prevent you from having lung cancer treatment.  Breast Cancer  Practice breast self-awareness. This means understanding how your breasts normally appear and feel.  It also means doing regular breast self-exams. Let your health care provider know about any changes, no matter how small.  If you are in your 20s or 30s, you should have a clinical breast exam (CBE) by a health care provider every 1-3 years as part of a regular health exam.  If you are 40  or older, have a CBE every year. Also consider having a breast X-ray (mammogram) every year.  If you have a family history of breast cancer, talk to your health care provider about genetic screening.  If you are at high risk for breast cancer, talk to your health care provider about having an MRI and a mammogram every year.  Breast cancer gene (BRCA) assessment is recommended for women who have family members with BRCA-related cancers. BRCA-related cancers include:  Breast.  Ovarian.  Tubal.  Peritoneal cancers.  Results of the assessment will determine the need for genetic  counseling and BRCA1 and BRCA2 testing. Cervical Cancer Your health care provider may recommend that you be screened regularly for cancer of the pelvic organs (ovaries, uterus, and vagina). This screening involves a pelvic examination, including checking for microscopic changes to the surface of your cervix (Pap test). You may be encouraged to have this screening done every 3 years, beginning at age 12.  For women ages 84-65, health care providers may recommend pelvic exams and Pap testing every 3 years, or they may recommend the Pap and pelvic exam, combined with testing for human papilloma virus (HPV), every 5 years. Some types of HPV increase your risk of cervical cancer. Testing for HPV may also be done on women of any age with unclear Pap test results.  Other health care providers may not recommend any screening for nonpregnant women who are considered low risk for pelvic cancer and who do not have symptoms. Ask your health care provider if a screening pelvic exam is right for you.  If you have had past treatment for cervical cancer or a condition that could lead to cancer, you need Pap tests and screening for cancer for at least 20 years after your treatment. If Pap tests have been discontinued, your risk factors (such as having a new sexual partner) need to be reassessed to determine if screening should resume. Some women have medical problems that increase the chance of getting cervical cancer. In these cases, your health care provider may recommend more frequent screening and Pap tests. Colorectal Cancer  This type of cancer can be detected and often prevented.  Routine colorectal cancer screening usually begins at 63 years of age and continues through 63 years of age.  Your health care provider may recommend screening at an earlier age if you have risk factors for colon cancer.  Your health care provider may also recommend using home test kits to check for hidden blood in the stool.  A  small camera at the end of a tube can be used to examine your colon directly (sigmoidoscopy or colonoscopy). This is done to check for the earliest forms of colorectal cancer.  Routine screening usually begins at age 49.  Direct examination of the colon should be repeated every 5-10 years through 63 years of age. However, you may need to be screened more often if early forms of precancerous polyps or small growths are found. Skin Cancer  Check your skin from head to toe regularly.  Tell your health care provider about any new moles or changes in moles, especially if there is a change in a mole's shape or color.  Also tell your health care provider if you have a mole that is larger than the size of a pencil eraser.  Always use sunscreen. Apply sunscreen liberally and repeatedly throughout the day.  Protect yourself by wearing long sleeves, pants, a wide-brimmed hat, and sunglasses whenever you are outside. HEART DISEASE,  DIABETES, AND HIGH BLOOD PRESSURE   High blood pressure causes heart disease and increases the risk of stroke. High blood pressure is more likely to develop in:  People who have blood pressure in the high end of the normal range (130-139/85-89 mm Hg).  People who are overweight or obese.  People who are African American.  If you are 42-41 years of age, have your blood pressure checked every 3-5 years. If you are 38 years of age or older, have your blood pressure checked every year. You should have your blood pressure measured twice--once when you are at a hospital or clinic, and once when you are not at a hospital or clinic. Record the average of the two measurements. To check your blood pressure when you are not at a hospital or clinic, you can use:  An automated blood pressure machine at a pharmacy.  A home blood pressure monitor.  If you are between 10 years and 87 years old, ask your health care provider if you should take aspirin to prevent strokes.  Have  regular diabetes screenings. This involves taking a blood sample to check your fasting blood sugar level.  If you are at a normal weight and have a low risk for diabetes, have this test once every three years after 63 years of age.  If you are overweight and have a high risk for diabetes, consider being tested at a younger age or more often. PREVENTING INFECTION  Hepatitis B  If you have a higher risk for hepatitis B, you should be screened for this virus. You are considered at high risk for hepatitis B if:  You were born in a country where hepatitis B is common. Ask your health care provider which countries are considered high risk.  Your parents were born in a high-risk country, and you have not been immunized against hepatitis B (hepatitis B vaccine).  You have HIV or AIDS.  You use needles to inject street drugs.  You live with someone who has hepatitis B.  You have had sex with someone who has hepatitis B.  You get hemodialysis treatment.  You take certain medicines for conditions, including cancer, organ transplantation, and autoimmune conditions. Hepatitis C  Blood testing is recommended for:  Everyone born from [birth year] through 1963.  Anyone with known risk factors for hepatitis C. Sexually transmitted infections (STIs)  You should be screened for sexually transmitted infections (STIs) including gonorrhea and chlamydia if:  You are sexually active and are younger than 63 years of age.  You are older than 63 years of age and your health care provider tells you that you are at risk for this type of infection.  Your sexual activity has changed since you were last screened and you are at an increased risk for chlamydia or gonorrhea. Ask your health care provider if you are at risk.  If you do not have HIV, but are at risk, it may be recommended that you take a prescription medicine daily to prevent HIV infection. This is called pre-exposure prophylaxis (PrEP). You are  considered at risk if:  You are sexually active and do not regularly use condoms or know the HIV status of your partner(s).  You take drugs by injection.  You are sexually active with a partner who has HIV. Talk with your health care provider about whether you are at high risk of being infected with HIV. If you choose to begin PrEP, you should first be tested for HIV. You should then  be tested every 3 months for as long as you are taking PrEP.  PREGNANCY   If you are premenopausal and you may become pregnant, ask your health care provider about preconception counseling.  If you may become pregnant, take 400 to 800 micrograms (mcg) of folic acid every day.  If you want to prevent pregnancy, talk to your health care provider about birth control (contraception). OSTEOPOROSIS AND MENOPAUSE   Osteoporosis is a disease in which the bones lose minerals and strength with aging. This can result in serious bone fractures. Your risk for osteoporosis can be identified using a bone density scan.  If you are 33 years of age or older, or if you are at risk for osteoporosis and fractures, ask your health care provider if you should be screened.  Ask your health care provider whether you should take a calcium or vitamin D supplement to lower your risk for osteoporosis.  Menopause may have certain physical symptoms and risks.  Hormone replacement therapy may reduce some of these symptoms and risks. Talk to your health care provider about whether hormone replacement therapy is right for you.  HOME CARE INSTRUCTIONS   Schedule regular health, dental, and eye exams.  Stay current with your immunizations.   Do not use any tobacco products including cigarettes, chewing tobacco, or electronic cigarettes.  If you are pregnant, do not drink alcohol.  If you are breastfeeding, limit how much and how often you drink alcohol.  Limit alcohol intake to no more than 1 drink per day for nonpregnant women. One  drink equals 12 ounces of beer, 5 ounces of wine, or 1 ounces of hard liquor.  Do not use street drugs.  Do not share needles.  Ask your health care provider for help if you need support or information about quitting drugs.  Tell your health care provider if you often feel depressed.  Tell your health care provider if you have ever been abused or do not feel safe at home.   This information is not intended to replace advice given to you by your health care provider. Make sure you discuss any questions you have with your health care provider.   Document Released: 11/21/2010 Document Revised: 05/29/2014 Document Reviewed: 04/09/2013 Elsevier Interactive Patient Education [employer].

## 2016-04-21 DIAGNOSIS — Z1231 Encounter for screening mammogram for malignant neoplasm of breast: Secondary | ICD-10-CM | POA: Diagnosis not present

## 2016-04-26 ENCOUNTER — Telehealth: Payer: Self-pay

## 2016-04-26 ENCOUNTER — Encounter: Payer: Self-pay | Admitting: Adult Health

## 2016-04-26 ENCOUNTER — Other Ambulatory Visit: Payer: Self-pay

## 2016-04-26 ENCOUNTER — Ambulatory Visit (INDEPENDENT_AMBULATORY_CARE_PROVIDER_SITE_OTHER): Payer: BLUE CROSS/BLUE SHIELD | Admitting: Adult Health

## 2016-04-26 VITALS — BP 132/74 | Temp 99.2°F | Ht 62.0 in | Wt 141.8 lb

## 2016-04-26 DIAGNOSIS — N6002 Solitary cyst of left breast: Secondary | ICD-10-CM | POA: Diagnosis not present

## 2016-04-26 DIAGNOSIS — Z0000 Encounter for general adult medical examination without abnormal findings: Secondary | ICD-10-CM | POA: Diagnosis not present

## 2016-04-26 DIAGNOSIS — I10 Essential (primary) hypertension: Secondary | ICD-10-CM

## 2016-04-26 DIAGNOSIS — N6489 Other specified disorders of breast: Secondary | ICD-10-CM | POA: Diagnosis not present

## 2016-04-26 DIAGNOSIS — M8580 Other specified disorders of bone density and structure, unspecified site: Secondary | ICD-10-CM | POA: Diagnosis not present

## 2016-04-26 DIAGNOSIS — E785 Hyperlipidemia, unspecified: Secondary | ICD-10-CM | POA: Diagnosis not present

## 2016-04-26 DIAGNOSIS — F418 Other specified anxiety disorders: Secondary | ICD-10-CM | POA: Diagnosis not present

## 2016-04-26 DIAGNOSIS — N6320 Unspecified lump in the left breast, unspecified quadrant: Secondary | ICD-10-CM | POA: Diagnosis not present

## 2016-04-26 LAB — TSH: TSH: 1.1 u[IU]/mL (ref 0.35–4.50)

## 2016-04-26 LAB — POC URINALSYSI DIPSTICK (AUTOMATED)
Bilirubin, UA: NEGATIVE
Blood, UA: NEGATIVE
Glucose, UA: NEGATIVE
Ketones, UA: NEGATIVE
Nitrite, UA: NEGATIVE
Protein, UA: NEGATIVE
Spec Grav, UA: <=1.005
Urobilinogen, UA: 0.2
pH, UA: 6

## 2016-04-26 LAB — CBC WITH DIFFERENTIAL/PLATELET
Basophils Absolute: 0 10*3/uL (ref 0.0–0.1)
Basophils Relative: 0.6 % (ref 0.0–3.0)
Eosinophils Absolute: 0.2 10*3/uL (ref 0.0–0.7)
Eosinophils Relative: 2.2 % (ref 0.0–5.0)
HCT: 37.5 % (ref 36.0–46.0)
Hemoglobin: 12.4 g/dL (ref 12.0–15.0)
Lymphocytes Relative: 16.9 % (ref 12.0–46.0)
Lymphs Abs: 1.3 10*3/uL (ref 0.7–4.0)
MCHC: 33 g/dL (ref 30.0–36.0)
MCV: 86.1 fl (ref 78.0–100.0)
Monocytes Absolute: 0.4 10*3/uL (ref 0.1–1.0)
Monocytes Relative: 5.4 % (ref 3.0–12.0)
Neutro Abs: 5.9 10*3/uL (ref 1.4–7.7)
Neutrophils Relative %: 74.9 % (ref 43.0–77.0)
Platelets: 207 10*3/uL (ref 150.0–400.0)
RBC: 4.36 Mil/uL (ref 3.87–5.11)
RDW: 14 % (ref 11.5–15.5)
WBC: 7.9 10*3/uL (ref 4.0–10.5)

## 2016-04-26 LAB — BASIC METABOLIC PANEL WITH GFR
BUN: 13 mg/dL (ref 6–23)
CO2: 29 meq/L (ref 19–32)
Calcium: 9.9 mg/dL (ref 8.4–10.5)
Chloride: 103 meq/L (ref 96–112)
Creatinine, Ser: 0.84 mg/dL (ref 0.40–1.20)
GFR: 72.59 mL/min
Glucose, Bld: 109 mg/dL — ABNORMAL HIGH (ref 70–99)
Potassium: 3.8 meq/L (ref 3.5–5.1)
Sodium: 142 meq/L (ref 135–145)

## 2016-04-26 LAB — LIPID PANEL
Cholesterol: 238 mg/dL — ABNORMAL HIGH (ref 0–200)
HDL: 60.3 mg/dL
LDL Cholesterol: 162 mg/dL — ABNORMAL HIGH (ref 0–99)
NonHDL: 177.45
Total CHOL/HDL Ratio: 4
Triglycerides: 76 mg/dL (ref 0.0–149.0)
VLDL: 15.2 mg/dL (ref 0.0–40.0)

## 2016-04-26 LAB — HEPATIC FUNCTION PANEL
ALT: 13 U/L (ref 0–35)
AST: 15 U/L (ref 0–37)
Albumin: 4.5 g/dL (ref 3.5–5.2)
Alkaline Phosphatase: 77 U/L (ref 39–117)
Bilirubin, Direct: 0.1 mg/dL (ref 0.0–0.3)
Total Bilirubin: 0.4 mg/dL (ref 0.2–1.2)
Total Protein: 7.3 g/dL (ref 6.0–8.3)

## 2016-04-26 LAB — VITAMIN D 25 HYDROXY (VIT D DEFICIENCY, FRACTURES): VITD: 33.43 ng/mL (ref 30.00–100.00)

## 2016-04-26 MED ORDER — HYDROCORTISONE ACETATE 25 MG RE SUPP: RECTAL | 1 refills | Status: DC

## 2016-04-26 NOTE — Telephone Encounter (Signed)
Received PA request for hydrocortisone 25 mg suppositories. PA submitted & is pending. Key: [id number]

## 2016-04-26 NOTE — Progress Notes (Addendum)
Subjective:    Patient ID: Terri Gay, female    DOB: [birth year], 65 y.o.   MRN: [id number]  HPI  Patient presents for yearly preventative medicine examination. Terri Gay is a pleasant 65 year old female who  has a past medical history of Allergy; Anxiety; Arthritis; Depression; Diverticulosis (10/07/2010); GERD (gastroesophageal reflux disease); Hemorrhoids; History of chicken pox; Hyperlipidemia; Hypertension; Knee pain, left (10/24/2013); Osteopenia (04/25/2013); Osteoporosis; Palpitations (04/26/2013); and Preventative health care (04/26/2013).   All immunizations and health maintenance protocols were reviewed with the patient and needed orders were placed.  Appropriate screening laboratory values were ordered for the patient including screening of hyperlipidemia, renal function and hepatic function.   Medication reconciliation,  past medical history, social history, problem list and allergies were reviewed in detail with the patient  Goals were established with regard to weight loss, exercise, and  diet in compliance with medications. She has lost five pounds since September   End of life planning was discussed.  She is up to date on her colonoscopy, dental and vision exams. She does self breast exams and has not noticed any changes in her breasts. She has her pap done by her GYN.   She had a mammogram done and needs to go back for a diagnostic mammogram due to changes in breast tissue.    She was taking Effexor for depression but reports that she had weaned herself off. She denies any issues with depression at this time.    She takes HCTZ 12.5 mg and a BB to control hypertention     Review of Systems  Constitutional: Negative.   HENT: Negative.   Eyes: Negative.   Respiratory: Negative.   Cardiovascular: Negative.   Gastrointestinal: Negative.   Endocrine: Negative.   Genitourinary: Negative.   Musculoskeletal: Negative.   Skin: Negative.   Allergic/Immunologic: Negative.     Neurological: Negative.   Hematological: Negative.   Psychiatric/Behavioral: Negative.   All other systems reviewed and are negative.  Past Medical History:  Diagnosis Date  . Allergy    seasonal  . Anxiety   . Arthritis   . Depression   . Diverticulosis 10/07/2010   Colonoscopy  . GERD (gastroesophageal reflux disease)   . Hemorrhoids   . History of chicken pox   . Hyperlipidemia   . Hypertension   . Knee pain, left 10/24/2013  . Osteopenia 04/25/2013  . Osteoporosis   . Palpitations 04/26/2013  . Preventative health care 04/26/2013    Social History   Social History  . Marital status: Married    Spouse name: N/A  . Number of children: 2  . Years of education: N/A   Occupational History  . [employer]   Social History Main Topics  . Smoking status: Never Smoker  . Smokeless tobacco: Never Used  . Alcohol use 0.6 oz/week    1 Glasses of wine per week     Comment: occasionally  . Drug use: No  . Sexual activity: Not on file   Other Topics Concern  . Not on file   Social History Narrative   Retired from [employer]    Married for 27 years    Have one son and one daughter ( live in [place])       She likes to dance, read, and play with her dogs.          Caffeine Use: 1 cup coffee and 1 cup tea  Past Surgical History:  Procedure Laterality Date  . NO PAST SURGERIES      Family History  Problem Relation Age of Onset  . Lung cancer Father   . Lung cancer Paternal Grandfather   . Diabetes Mother   . Arthritis Mother   . Hyperlipidemia Mother   . Hypertension Mother   . Heart disease Mother     CHF  . Diabetes Maternal Grandmother   . Heart disease Maternal Grandmother   . Arthritis Maternal Grandmother   . Hyperlipidemia Maternal Grandmother   . Hypertension Maternal Grandmother   . Hyperlipidemia Sister   . Arthritis Paternal Grandmother   . Heart disease Daughter     arrythmia, Internal Cardiac  Defibrillator  . Lupus Son   . Drug abuse Son   . Colon cancer Neg Hx   . Esophageal cancer Neg Hx   . Rectal cancer Neg Hx   . Stomach cancer Neg Hx     Allergies  Allergen Reactions  . Amoxicillin Diarrhea    Current Outpatient Prescriptions on File Prior to Visit  Medication Sig Dispense Refill  . bisoprolol (ZEBETA) 10 MG tablet Take 1 tablet (10 mg total) by mouth daily. 90 tablet 3  . Calcium Carbonate (CALCIUM 600 PO) Take 1,200 mg by mouth daily.    . Cholecalciferol (VITAMIN D-3) 5000 UNITS TABS Take 5,000 Units by mouth daily.     Marland Kitchen estradiol (ESTRACE) 0.1 MG/GM vaginal cream Place 2 g vaginally 3 (three) times a week.    . hydrochlorothiazide (MICROZIDE) 12.5 MG capsule Take 1 capsule (12.5 mg total) by mouth daily. 90 capsule 3  . ibuprofen (ADVIL,MOTRIN) 200 MG tablet Take 600 mg by mouth daily as needed. For pain    . magnesium gluconate (MAGONATE) 500 MG tablet Take 500 mg by mouth daily.    . Omega-3 Fatty Acids (FISH OIL) 1200 MG CAPS Take 1,200 mg by mouth daily.    . Probiotic Product (PROBIOTIC DAILY) CAPS Take 1 capsule by mouth daily.    . progesterone (PROMETRIUM) 100 MG capsule Take 100 mg by mouth at bedtime.      . RED YEAST RICE EXTRACT PO Take 4 tablets by mouth at bedtime.    . temazepam (RESTORIL) 30 MG capsule Take 30 mg by mouth at bedtime as needed for sleep.    Marland Kitchen venlafaxine XR (EFFEXOR-XR) 75 MG 24 hr capsule Take 75 mg by mouth daily.     No current facility-administered medications on file prior to visit.     BP 132/74   Temp 99.2 F (37.3 C) (Oral)   Ht 5\' 2"  (1.575 m)   Wt 141 lb 12.8 oz (64.3 kg)   BMI 25.94 kg/m       Objective:   Physical Exam  Constitutional: She is oriented to person, place, and time. She appears well-developed and well-nourished. No distress.  HENT:  Head: Normocephalic and atraumatic.  Right Ear: External ear normal.  Left Ear: External ear normal.  Nose: Nose normal.  Mouth/Throat: Oropharynx is clear  and moist. No oropharyngeal exudate.  Eyes: Conjunctivae and EOM are normal. Pupils are equal, round, and reactive to light. Right eye exhibits no discharge. Left eye exhibits no discharge. No scleral icterus.  Neck: Normal range of motion. Neck supple. No JVD present. Carotid bruit is not present. No tracheal deviation present. No thyromegaly present.  Cardiovascular: Normal rate, regular rhythm, normal heart sounds and intact distal pulses.  Exam reveals no gallop and no friction rub.  No murmur heard. Pulmonary/Chest: Effort normal and breath sounds normal. No stridor. No respiratory distress. She has no wheezes. She has no rales. She exhibits no tenderness.  Abdominal: Soft. Bowel sounds are normal. She exhibits no distension and no mass. There is no tenderness. There is no rebound and no guarding.  Musculoskeletal: Normal range of motion. She exhibits no edema, tenderness or deformity.  Lymphadenopathy:    She has no cervical adenopathy.  Neurological: She is alert and oriented to person, place, and time. She has normal reflexes. She displays normal reflexes. No cranial nerve deficit. She exhibits normal muscle tone. Coordination normal.  Skin: Skin is warm and dry. No rash noted. No erythema. No pallor.  Psychiatric: She has a normal mood and affect. Her behavior is normal. Judgment and thought content normal.  Nursing note and vitals reviewed.     Assessment & Plan:  1. Routine general medical examination at a health care facility - Patient encouraged to maintain a heart healthy diet, regular aerobic exercise and to get adequate sleep. Take all medications as prescribed   - Basic metabolic panel - CBC with Differential/Platelet - Hepatic function panel - Lipid panel - POCT Urinalysis Dipstick (Automated) - TSH  2. Hyperlipidemia, unspecified hyperlipidemia type - Encouraged a heart healthy diet and aerobic exercise.  - avoid trans fats in diet - Basic metabolic panel - CBC with  Differential/Platelet - Hepatic function panel - Lipid panel - POCT Urinalysis Dipstick (Automated) - TSH  3. Depression with anxiety - Controlled currently without medications  4. Essential hypertension - Well controlled on current medication - Encouraged heart healthy diet such as the DASH diet and exercise as tolerated.  - Basic metabolic panel - CBC with Differential/Platelet - Hepatic function panel - Lipid panel - POCT Urinalysis Dipstick (Automated) - TSH  5. Osteopenia, unspecified location  - Vitamin D, 25-hydroxy  Dorothyann Peng, NP

## 2016-04-26 NOTE — Patient Instructions (Addendum)
It was great seeing you today!   Overall, your exam was great!  I will follow up with you regarding your labs.   I wish you the best of luck today   If you need anything, please let me know  Health Maintenance, Female Introduction Adopting a healthy lifestyle and getting preventive care can go a long way to promote health and wellness. Talk with your health care provider about what schedule of regular examinations is right for you. This is a good chance for you to check in with your provider about disease prevention and staying healthy. In between checkups, there are plenty of things you can do on your own. Experts have done a lot of research about which lifestyle changes and preventive measures are most likely to keep you healthy. Ask your health care provider for more information. Weight and diet Eat a healthy diet  Be sure to include plenty of vegetables, fruits, low-fat dairy products, and lean protein.  Do not eat a lot of foods high in solid fats, added sugars, or salt.  Get regular exercise. This is one of the most important things you can do for your health.  Most adults should exercise for at least 150 minutes each week. The exercise should increase your heart rate and make you sweat (moderate-intensity exercise).  Most adults should also do strengthening exercises at least twice a week. This is in addition to the moderate-intensity exercise. Maintain a healthy weight  Body mass index (BMI) is a measurement that can be used to identify possible weight problems. It estimates body fat based on height and weight. Your health care provider can help determine your BMI and help you achieve or maintain a healthy weight.  For females 109 years of age and older:  A BMI below 18.5 is considered underweight.  A BMI of 18.5 to 24.9 is normal.  A BMI of 25 to 29.9 is considered overweight.  A BMI of 30 and above is considered obese. Watch levels of cholesterol and blood  lipids  You should start having your blood tested for lipids and cholesterol at 63 years of age, then have this test every 5 years.  You may need to have your cholesterol levels checked more often if:  Your lipid or cholesterol levels are high.  You are older than 63 years of age.  You are at high risk for heart disease. Cancer screening Lung Cancer  Lung cancer screening is recommended for adults 68-87 years old who are at high risk for lung cancer because of a history of smoking.  A yearly low-dose CT scan of the lungs is recommended for people who:  Currently smoke.  Have quit within the past 15 years.  Have at least a 30-pack-year history of smoking. A pack year is smoking an average of one pack of cigarettes a day for 1 year.  Yearly screening should continue until it has been 15 years since you quit.  Yearly screening should stop if you develop a health problem that would prevent you from having lung cancer treatment. Breast Cancer  Practice breast self-awareness. This means understanding how your breasts normally appear and feel.  It also means doing regular breast self-exams. Let your health care provider know about any changes, no matter how small.  If you are in your 20s or 30s, you should have a clinical breast exam (CBE) by a health care provider every 1-3 years as part of a regular health exam.  If you are 40 or  older, have a CBE every year. Also consider having a breast X-ray (mammogram) every year.  If you have a family history of breast cancer, talk to your health care provider about genetic screening.  If you are at high risk for breast cancer, talk to your health care provider about having an MRI and a mammogram every year.  Breast cancer gene (BRCA) assessment is recommended for women who have family members with BRCA-related cancers. BRCA-related cancers include:  Breast.  Ovarian.  Tubal.  Peritoneal cancers.  Results of the assessment will  determine the need for genetic counseling and BRCA1 and BRCA2 testing. Cervical Cancer  Your health care provider may recommend that you be screened regularly for cancer of the pelvic organs (ovaries, uterus, and vagina). This screening involves a pelvic examination, including checking for microscopic changes to the surface of your cervix (Pap test). You may be encouraged to have this screening done every 3 years, beginning at age 63.  For women ages 49-65, health care providers may recommend pelvic exams and Pap testing every 3 years, or they may recommend the Pap and pelvic exam, combined with testing for human papilloma virus (HPV), every 5 years. Some types of HPV increase your risk of cervical cancer. Testing for HPV may also be done on women of any age with unclear Pap test results.  Other health care providers may not recommend any screening for nonpregnant women who are considered low risk for pelvic cancer and who do not have symptoms. Ask your health care provider if a screening pelvic exam is right for you.  If you have had past treatment for cervical cancer or a condition that could lead to cancer, you need Pap tests and screening for cancer for at least 20 years after your treatment. If Pap tests have been discontinued, your risk factors (such as having a new sexual partner) need to be reassessed to determine if screening should resume. Some women have medical problems that increase the chance of getting cervical cancer. In these cases, your health care provider may recommend more frequent screening and Pap tests. Colorectal Cancer  This type of cancer can be detected and often prevented.  Routine colorectal cancer screening usually begins at 63 years of age and continues through 63 years of age.  Your health care provider may recommend screening at an earlier age if you have risk factors for colon cancer.  Your health care provider may also recommend using home test kits to check for  hidden blood in the stool.  A small camera at the end of a tube can be used to examine your colon directly (sigmoidoscopy or colonoscopy). This is done to check for the earliest forms of colorectal cancer.  Routine screening usually begins at age 60.  Direct examination of the colon should be repeated every 5-10 years through 63 years of age. However, you may need to be screened more often if early forms of precancerous polyps or small growths are found. Skin Cancer  Check your skin from head to toe regularly.  Tell your health care provider about any new moles or changes in moles, especially if there is a change in a mole's shape or color.  Also tell your health care provider if you have a mole that is larger than the size of a pencil eraser.  Always use sunscreen. Apply sunscreen liberally and repeatedly throughout the day.  Protect yourself by wearing long sleeves, pants, a wide-brimmed hat, and sunglasses whenever you are outside. Heart disease,  diabetes, and high blood pressure  High blood pressure causes heart disease and increases the risk of stroke. High blood pressure is more likely to develop in:  People who have blood pressure in the high end of the normal range (130-139/85-89 mm Hg).  People who are overweight or obese.  People who are African American.  If you are 31-23 years of age, have your blood pressure checked every 3-5 years. If you are 59 years of age or older, have your blood pressure checked every year. You should have your blood pressure measured twice-once when you are at a hospital or clinic, and once when you are not at a hospital or clinic. Record the average of the two measurements. To check your blood pressure when you are not at a hospital or clinic, you can use:  An automated blood pressure machine at a pharmacy.  A home blood pressure monitor.  If you are between 25 years and 24 years old, ask your health care provider if you should take aspirin to  prevent strokes.  Have regular diabetes screenings. This involves taking a blood sample to check your fasting blood sugar level.  If you are at a normal weight and have a low risk for diabetes, have this test once every three years after 63 years of age.  If you are overweight and have a high risk for diabetes, consider being tested at a younger age or more often. Preventing infection Hepatitis B  If you have a higher risk for hepatitis B, you should be screened for this virus. You are considered at high risk for hepatitis B if:  You were born in a country where hepatitis B is common. Ask your health care provider which countries are considered high risk.  Your parents were born in a high-risk country, and you have not been immunized against hepatitis B (hepatitis B vaccine).  You have HIV or AIDS.  You use needles to inject street drugs.  You live with someone who has hepatitis B.  You have had sex with someone who has hepatitis B.  You get hemodialysis treatment.  You take certain medicines for conditions, including cancer, organ transplantation, and autoimmune conditions. Hepatitis C  Blood testing is recommended for:  Everyone born from [birth year] through 1963.  Anyone with known risk factors for hepatitis C. Sexually transmitted infections (STIs)  You should be screened for sexually transmitted infections (STIs) including gonorrhea and chlamydia if:  You are sexually active and are younger than 63 years of age.  You are older than 63 years of age and your health care provider tells you that you are at risk for this type of infection.  Your sexual activity has changed since you were last screened and you are at an increased risk for chlamydia or gonorrhea. Ask your health care provider if you are at risk.  If you do not have HIV, but are at risk, it may be recommended that you take a prescription medicine daily to prevent HIV infection. This is called pre-exposure  prophylaxis (PrEP). You are considered at risk if:  You are sexually active and do not regularly use condoms or know the HIV status of your partner(s).  You take drugs by injection.  You are sexually active with a partner who has HIV. Talk with your health care provider about whether you are at high risk of being infected with HIV. If you choose to begin PrEP, you should first be tested for HIV. You should then be tested  every 3 months for as long as you are taking PrEP. Pregnancy  If you are premenopausal and you may become pregnant, ask your health care provider about preconception counseling.  If you may become pregnant, take 400 to 800 micrograms (mcg) of folic acid every day.  If you want to prevent pregnancy, talk to your health care provider about birth control (contraception). Osteoporosis and menopause  Osteoporosis is a disease in which the bones lose minerals and strength with aging. This can result in serious bone fractures. Your risk for osteoporosis can be identified using a bone density scan.  If you are 40 years of age or older, or if you are at risk for osteoporosis and fractures, ask your health care provider if you should be screened.  Ask your health care provider whether you should take a calcium or vitamin D supplement to lower your risk for osteoporosis.  Menopause may have certain physical symptoms and risks.  Hormone replacement therapy may reduce some of these symptoms and risks. Talk to your health care provider about whether hormone replacement therapy is right for you. Follow these instructions at home:  Schedule regular health, dental, and eye exams.  Stay current with your immunizations.  Do not use any tobacco products including cigarettes, chewing tobacco, or electronic cigarettes.  If you are pregnant, do not drink alcohol.  If you are breastfeeding, limit how much and how often you drink alcohol.  Limit alcohol intake to no more than 1 drink  per day for nonpregnant women. One drink equals 12 ounces of beer, 5 ounces of wine, or 1 ounces of hard liquor.  Do not use street drugs.  Do not share needles.  Ask your health care provider for help if you need support or information about quitting drugs.  Tell your health care provider if you often feel depressed.  Tell your health care provider if you have ever been abused or do not feel safe at home. This information is not intended to replace advice given to you by your health care provider. Make sure you discuss any questions you have with your health care provider. Document Released: 11/21/2010 Document Revised: 10/14/2015 Document Reviewed: 02/09/2015  2017 [employer]vier

## 2016-04-27 NOTE — Telephone Encounter (Signed)
Noted  

## 2016-04-27 NOTE — Telephone Encounter (Signed)
PA denied because the requested service is not a covered benefit per the benefit booklet.

## 2016-05-01 ENCOUNTER — Telehealth: Payer: Self-pay | Admitting: Adult Health

## 2016-05-01 NOTE — Telephone Encounter (Signed)
° °  Pt contacted pharmacy and they told her they did not received the below RX    Pt request refill of the following:  hydrochlorothiazide (MICROZIDE) 12.5 MG capsule  Ph[facility]acy:  [facility]

## 2016-05-02 ENCOUNTER — Other Ambulatory Visit: Payer: Self-pay

## 2016-05-02 DIAGNOSIS — I10 Essential (primary) hypertension: Secondary | ICD-10-CM

## 2016-05-02 MED ORDER — HYDROCHLOROTHIAZIDE 12.5 MG PO CAPS: 12.5000 mg | ORAL_CAPSULE | Freq: Every day | ORAL | 3 refills | Status: DC

## 2016-05-02 NOTE — Telephone Encounter (Signed)
Have her call her insurance carrier and find out what they will cover

## 2016-05-02 NOTE — Telephone Encounter (Signed)
Is there an alternative that can be prescribed to patient?

## 2016-05-02 NOTE — Telephone Encounter (Signed)
rx has been sent in

## 2016-05-02 NOTE — Telephone Encounter (Signed)
Patient notified and verbalized understanding. 

## 2016-12-28 DIAGNOSIS — Z01419 Encounter for gynecological examination (general) (routine) without abnormal findings: Secondary | ICD-10-CM | POA: Diagnosis not present

## 2016-12-28 DIAGNOSIS — Z6825 Body mass index (BMI) 25.0-25.9, adult: Secondary | ICD-10-CM | POA: Diagnosis not present

## 2017-02-24 DIAGNOSIS — Z23 Encounter for immunization: Secondary | ICD-10-CM | POA: Diagnosis not present

## 2017-02-27 ENCOUNTER — Other Ambulatory Visit: Payer: Self-pay | Admitting: Adult Health

## 2017-02-27 DIAGNOSIS — I10 Essential (primary) hypertension: Secondary | ICD-10-CM

## 2017-02-27 NOTE — Telephone Encounter (Signed)
Sent to the pharmacy by e-scribe for 90 days.  Pt has an appt on 04/27/17 for cpx.

## 2017-03-05 DIAGNOSIS — N951 Menopausal and female climacteric states: Secondary | ICD-10-CM | POA: Diagnosis not present

## 2017-03-05 DIAGNOSIS — F419 Anxiety disorder, unspecified: Secondary | ICD-10-CM | POA: Diagnosis not present

## 2017-03-28 DIAGNOSIS — F419 Anxiety disorder, unspecified: Secondary | ICD-10-CM | POA: Diagnosis not present

## 2017-03-28 DIAGNOSIS — F329 Major depressive disorder, single episode, unspecified: Secondary | ICD-10-CM | POA: Diagnosis not present

## 2017-03-28 DIAGNOSIS — Z1231 Encounter for screening mammogram for malignant neoplasm of breast: Secondary | ICD-10-CM | POA: Diagnosis not present

## 2017-04-27 ENCOUNTER — Encounter: Payer: Self-pay | Admitting: *Deleted

## 2017-04-27 ENCOUNTER — Encounter: Payer: Self-pay | Admitting: Adult Health

## 2017-04-27 ENCOUNTER — Ambulatory Visit (INDEPENDENT_AMBULATORY_CARE_PROVIDER_SITE_OTHER): Payer: BLUE CROSS/BLUE SHIELD | Admitting: Adult Health

## 2017-04-27 VITALS — BP 136/86 | Temp 98.3°F | Ht 61.25 in | Wt 141.0 lb

## 2017-04-27 DIAGNOSIS — K219 Gastro-esophageal reflux disease without esophagitis: Secondary | ICD-10-CM

## 2017-04-27 DIAGNOSIS — Z0000 Encounter for general adult medical examination without abnormal findings: Secondary | ICD-10-CM

## 2017-04-27 DIAGNOSIS — Z1159 Encounter for screening for other viral diseases: Secondary | ICD-10-CM | POA: Diagnosis not present

## 2017-04-27 DIAGNOSIS — E785 Hyperlipidemia, unspecified: Secondary | ICD-10-CM | POA: Diagnosis not present

## 2017-04-27 DIAGNOSIS — I10 Essential (primary) hypertension: Secondary | ICD-10-CM | POA: Diagnosis not present

## 2017-04-27 LAB — BASIC METABOLIC PANEL WITH GFR
BUN: 12 mg/dL (ref 6–23)
CO2: 27 meq/L (ref 19–32)
Calcium: 8.9 mg/dL (ref 8.4–10.5)
Chloride: 102 meq/L (ref 96–112)
Creatinine, Ser: 0.79 mg/dL (ref 0.40–1.20)
GFR: 77.67 mL/min
Glucose, Bld: 86 mg/dL (ref 70–99)
Potassium: 4.3 meq/L (ref 3.5–5.1)
Sodium: 138 meq/L (ref 135–145)

## 2017-04-27 LAB — CBC WITH DIFFERENTIAL/PLATELET
Basophils Absolute: 0.1 10*3/uL (ref 0.0–0.1)
Basophils Relative: 1.1 % (ref 0.0–3.0)
Eosinophils Absolute: 0.2 10*3/uL (ref 0.0–0.7)
Eosinophils Relative: 3 % (ref 0.0–5.0)
HCT: 38.7 % (ref 36.0–46.0)
Hemoglobin: 12.7 g/dL (ref 12.0–15.0)
Lymphocytes Relative: 14.1 % (ref 12.0–46.0)
Lymphs Abs: 0.9 10*3/uL (ref 0.7–4.0)
MCHC: 32.7 g/dL (ref 30.0–36.0)
MCV: 88.9 fl (ref 78.0–100.0)
Monocytes Absolute: 0.6 10*3/uL (ref 0.1–1.0)
Monocytes Relative: 9.2 % (ref 3.0–12.0)
Neutro Abs: 4.6 10*3/uL (ref 1.4–7.7)
Neutrophils Relative %: 72.6 % (ref 43.0–77.0)
Platelets: 200 10*3/uL (ref 150.0–400.0)
RBC: 4.36 Mil/uL (ref 3.87–5.11)
RDW: 13.6 % (ref 11.5–15.5)
WBC: 6.4 10*3/uL (ref 4.0–10.5)

## 2017-04-27 LAB — LIPID PANEL
Cholesterol: 207 mg/dL — ABNORMAL HIGH (ref 0–200)
HDL: 50.4 mg/dL
LDL Cholesterol: 139 mg/dL — ABNORMAL HIGH (ref 0–99)
NonHDL: 156.2
Total CHOL/HDL Ratio: 4
Triglycerides: 87 mg/dL (ref 0.0–149.0)
VLDL: 17.4 mg/dL (ref 0.0–40.0)

## 2017-04-27 LAB — HEPATIC FUNCTION PANEL
ALT: 9 U/L (ref 0–35)
AST: 14 U/L (ref 0–37)
Albumin: 4.2 g/dL (ref 3.5–5.2)
Alkaline Phosphatase: 60 U/L (ref 39–117)
Bilirubin, Direct: 0.1 mg/dL (ref 0.0–0.3)
Total Bilirubin: 0.6 mg/dL (ref 0.2–1.2)
Total Protein: 6.6 g/dL (ref 6.0–8.3)

## 2017-04-27 LAB — TSH: TSH: 0.93 u[IU]/mL (ref 0.35–4.50)

## 2017-04-27 NOTE — Progress Notes (Signed)
Subjective:    Patient ID: Terri Gay, female    DOB: March 23, 1951, 66 y.o.   MRN: [id number]  HPI Patient presents for yearly preventative medicine examination. She is a pleasant 66 year old female who  has a past medical history of Allergy, Anxiety, Arthritis, Depression, Diverticulosis (10/07/2010), GERD (gastroesophageal reflux disease), Hemorrhoids, History of chicken pox, Hyperlipidemia, Hypertension, Knee pain, left (10/24/2013), Osteopenia (04/25/2013), Osteoporosis, Palpitations (04/26/2013), and Preventative health care (04/26/2013).  She is taking a diuretic and BB for hypertension   She takes Restoril 30 mg for insomnia   She has a history of hyperlipidemia but is not currently on a statin   All immunizations and health maintenance protocols were reviewed with the patient and needed orders were placed. She is UTD on vaccinations   Appropriate screening laboratory values were ordered for the patient including screening of hyperlipidemia, renal function and hepatic function.  Medication reconciliation,  past medical history, social history, problem list and allergies were reviewed in detail with the patient  Goals were established with regard to weight loss, exercise, and  diet in compliance with medications. She is eating healthy and has been walking.   End of life planning was discussed.  She is up to date on her colonoscopy,mammogram, dental and vision exams. She does self breast exams and has not noticed any changes in her breasts. She has her pap done by her GYN.   She has not had any interval history nor does she have any acute complaints.   Review of Systems  Constitutional: Negative.   HENT: Negative.   Eyes: Negative.   Respiratory: Negative.   Cardiovascular: Negative.   Gastrointestinal: Negative.   Endocrine: Negative.   Genitourinary: Negative.   Musculoskeletal: Negative.   Skin: Negative.   Allergic/Immunologic: Negative.   Neurological: Negative.     Hematological: Negative.   Psychiatric/Behavioral: Negative.    Past Medical History:  Diagnosis Date  . Allergy    seasonal  . Anxiety   . Arthritis   . Depression   . Diverticulosis 10/07/2010   Colonoscopy  . GERD (gastroesophageal reflux disease)   . Hemorrhoids   . History of chicken pox   . Hyperlipidemia   . Hypertension   . Knee pain, left 10/24/2013  . Osteopenia 04/25/2013  . Osteoporosis   . Palpitations 04/26/2013  . Preventative health care 04/26/2013    Social History   Socioeconomic History  . Marital status: Married    Spouse name: Not on file  . Number of children: 2  . Years of education: Not on file  . Highest education level: Not on file  Social Needs  . Financial resource strain: Not on file  . Food insecurity - worry: Not on file  . Food insecurity - inability: Not on file  . Transportation needs - medical: Not on file  . Transportation needs - non-medical: Not on file  Occupational History  . Occupation: Research scientist (physical sciences): [employer]  Tobacco Use  . Smoking status: Never Smoker  . Smokeless tobacco: Never Used  Substance and Sexual Activity  . Alcohol use: Yes    Alcohol/week: 0.6 oz    Types: 1 Glasses of wine per week    Comment: occasionally  . Drug use: No  . Sexual activity: Not on file  Other Topics Concern  . Not on file  Social History Narrative   Retired from [employer]    Married for 27 years    Have  one son and one daughter ( live in [place])       She likes to dance, read, and play with her dogs.          Caffeine Use: 1 cup coffee and 1 cup tea       Past Surgical History:  Procedure Laterality Date  . NO PAST SURGERIES      Family History  Problem Relation Age of Onset  . Lung cancer Father   . Lung cancer Paternal Grandfather   . Diabetes Mother   . Arthritis Mother   . Hyperlipidemia Mother   . Hypertension Mother   . Heart disease Mother        CHF  . Diabetes Maternal  Grandmother   . Heart disease Maternal Grandmother   . Arthritis Maternal Grandmother   . Hyperlipidemia Maternal Grandmother   . Hypertension Maternal Grandmother   . Hyperlipidemia Sister   . Arthritis Paternal Grandmother   . Heart disease Daughter        arrythmia, Internal Cardiac Defibrillator  . Lupus Son   . Drug abuse Son   . Colon cancer Neg Hx   . Esophageal cancer Neg Hx   . Rectal cancer Neg Hx   . Stomach cancer Neg Hx     Allergies  Allergen Reactions  . Amoxicillin Diarrhea    Current Outpatient Medications on File Prior to Visit  Medication Sig Dispense Refill  . bisoprolol (ZEBETA) 10 MG tablet TAKE ONE TABLET BY MOUTH ONCE DAILY 90 tablet 0  . Calcium Carbonate (CALCIUM 600 PO) Take 1,200 mg by mouth daily.    . Cholecalciferol (VITAMIN D-3) 5000 UNITS TABS Take 5,000 Units by mouth daily.     . clonazePAM (KLONOPIN) 0.5 MG tablet Take 1 tablet by mouth in the morning and 2 tablets at bedtime as needed for anxiety  0  . escitalopram (LEXAPRO) 10 MG tablet Take 1 tablet by mouth daily.  2  . estradiol (VIVELLE-DOT) 0.025 MG/24HR AS DIRECTED  4  . hydrochlorothiazide (MICROZIDE) 12.5 MG capsule Take 1 capsule (12.5 mg total) by mouth daily. 90 capsule 3  . hydrocortisone (ANUSOL-HC) 25 MG suppository Use 1 suppository at bedtime for 10 days. 10 suppository 1  . ibuprofen (ADVIL,MOTRIN) 200 MG tablet Take 600 mg by mouth daily as needed. For pain    . magnesium gluconate (MAGONATE) 500 MG tablet Take 500 mg by mouth daily.    . Omega-3 Fatty Acids (FISH OIL) 1200 MG CAPS Take 1,200 mg by mouth daily.    . Probiotic Product (PROBIOTIC DAILY) CAPS Take 1 capsule by mouth daily.    . progesterone (PROMETRIUM) 100 MG capsule Take 200 mg by mouth at bedtime.     . RED YEAST RICE EXTRACT PO Take 4 tablets by mouth at bedtime.    . temazepam (RESTORIL) 30 MG capsule Take 30 mg by mouth at bedtime as needed for sleep.     No current facility-administered medications on  file prior to visit.     BP 136/86   Temp 98.3 F (36.8 C) (Oral)   Ht 5' 1.25" (1.556 m)   Wt 141 lb (64 kg)   BMI 26.42 kg/m       Objective:   Physical Exam  Constitutional: She is oriented to person, place, and time. She appears well-developed and well-nourished. No distress.  HENT:  Head: Normocephalic and atraumatic.  Right Ear: External ear normal.  Left Ear: External ear normal.  Nose: Nose normal.  Mouth/Throat: Oropharynx is clear and moist. No oropharyngeal exudate.  Eyes: Conjunctivae and EOM are normal. Pupils are equal, round, and reactive to light. Right eye exhibits no discharge. Left eye exhibits no discharge. No scleral icterus.  Neck: Normal range of motion. Neck supple. No JVD present. No tracheal deviation present. No thyromegaly present.  Cardiovascular: Normal rate, regular rhythm, normal heart sounds and intact distal pulses. Exam reveals no gallop and no friction rub.  No murmur heard. Pulmonary/Chest: Effort normal and breath sounds normal. No stridor. No respiratory distress. She has no wheezes. She has no rales. She exhibits no tenderness.  Abdominal: Soft. Bowel sounds are normal. She exhibits no distension and no mass. There is no tenderness. There is no rebound and no guarding.  Musculoskeletal: Normal range of motion. She exhibits no edema, tenderness or deformity.  Lymphadenopathy:    She has no cervical adenopathy.  Neurological: She is alert and oriented to person, place, and time. She has normal reflexes. She displays normal reflexes. No cranial nerve deficit. She exhibits normal muscle tone. Coordination normal.  Skin: Skin is warm and dry. No rash noted. She is not diaphoretic. No erythema. No pallor.  Psychiatric: She has a normal mood and affect. Her behavior is normal. Judgment and thought content normal.  Nursing note and vitals reviewed.     Assessment & Plan:  1. Routine general medical examination at a health care facility - Continue  to exercise and eat healthy. Follow up in one year or sooner if needed - Basic metabolic panel - CBC with Differential/Platelet - Hepatic function panel - Lipid panel - TSH - Hep C Antibody - HIV antibody  2. Hyperlipidemia, unspecified hyperlipidemia type - Consider statin  - Basic metabolic panel - CBC with Differential/Platelet - Hepatic function panel - Lipid panel - TSH  3. Essential hypertension - Near goal. Did not take medication this morning  - Basic metabolic panel - CBC with Differential/Platelet - Hepatic function panel - Lipid panel - TSH  4. Gastroesophageal reflux disease, esophagitis presence not specified  - asymptomatic   5. Need for hepatitis C screening test  - Hep C Antibody  Dorothyann Peng, NP

## 2017-04-27 NOTE — Patient Instructions (Addendum)
It was great seeing you today   I will follow up with you regarding your blood work   Continue to work on diet and exercise   Health Maintenance, Female Adopting a healthy lifestyle and getting preventive care can go a long way to promote health and wellness. Talk with your health care provider about what schedule of regular examinations is right for you. This is a good chance for you to check in with your provider about disease prevention and staying healthy. In between checkups, there are plenty of things you can do on your own. Experts have done a lot of research about which lifestyle changes and preventive measures are most likely to keep you healthy. Ask your health care provider for more information. Weight and diet Eat a healthy diet  Be sure to include plenty of vegetables, fruits, low-fat dairy products, and lean protein.  Do not eat a lot of foods high in solid fats, added sugars, or salt.  Get regular exercise. This is one of the most important things you can do for your health. ? Most adults should exercise for at least 150 minutes each week. The exercise should increase your heart rate and make you sweat (moderate-intensity exercise). ? Most adults should also do strengthening exercises at least twice a week. This is in addition to the moderate-intensity exercise.  Maintain a healthy weight  Body mass index (BMI) is a measurement that can be used to identify possible weight problems. It estimates body fat based on height and weight. Your health care provider can help determine your BMI and help you achieve or maintain a healthy weight.  For females 2 years of age and older: ? A BMI below 18.5 is considered underweight. ? A BMI of 18.5 to 24.9 is normal. ? A BMI of 25 to 29.9 is considered overweight. ? A BMI of 30 and above is considered obese.  Watch levels of cholesterol and blood lipids  You should start having your blood tested for lipids and cholesterol at 64 years  of age, then have this test every 5 years.  You may need to have your cholesterol levels checked more often if: ? Your lipid or cholesterol levels are high. ? You are older than 63 years of age. ? You are at high risk for heart disease.  Cancer screening Lung Cancer  Lung cancer screening is recommended for adults 77-21 years old who are at high risk for lung cancer because of a history of smoking.  A yearly low-dose CT scan of the lungs is recommended for people who: ? Currently smoke. ? Have quit within the past 15 years. ? Have at least a 30-pack-year history of smoking. A pack year is smoking an average of one pack of cigarettes a day for 1 year.  Yearly screening should continue until it has been 15 years since you quit.  Yearly screening should stop if you develop a health problem that would prevent you from having lung cancer treatment.  Breast Cancer  Practice breast self-awareness. This means understanding how your breasts normally appear and feel.  It also means doing regular breast self-exams. Let your health care provider know about any changes, no matter how small.  If you are in your 20s or 30s, you should have a clinical breast exam (CBE) by a health care provider every 1-3 years as part of a regular health exam.  If you are 73 or older, have a CBE every year. Also consider having a breast X-ray (  mammogram) every year.  If you have a family history of breast cancer, talk to your health care provider about genetic screening.  If you are at high risk for breast cancer, talk to your health care provider about having an MRI and a mammogram every year.  Breast cancer gene (BRCA) assessment is recommended for women who have family members with BRCA-related cancers. BRCA-related cancers include: ? Breast. ? Ovarian. ? Tubal. ? Peritoneal cancers.  Results of the assessment will determine the need for genetic counseling and BRCA1 and BRCA2 testing.  Cervical  Cancer Your health care provider may recommend that you be screened regularly for cancer of the pelvic organs (ovaries, uterus, and vagina). This screening involves a pelvic examination, including checking for microscopic changes to the surface of your cervix (Pap test). You may be encouraged to have this screening done every 3 years, beginning at age 86.  For women ages 45-65, health care providers may recommend pelvic exams and Pap testing every 3 years, or they may recommend the Pap and pelvic exam, combined with testing for human papilloma virus (HPV), every 5 years. Some types of HPV increase your risk of cervical cancer. Testing for HPV may also be done on women of any age with unclear Pap test results.  Other health care providers may not recommend any screening for nonpregnant women who are considered low risk for pelvic cancer and who do not have symptoms. Ask your health care provider if a screening pelvic exam is right for you.  If you have had past treatment for cervical cancer or a condition that could lead to cancer, you need Pap tests and screening for cancer for at least 20 years after your treatment. If Pap tests have been discontinued, your risk factors (such as having a new sexual partner) need to be reassessed to determine if screening should resume. Some women have medical problems that increase the chance of getting cervical cancer. In these cases, your health care provider may recommend more frequent screening and Pap tests.  Colorectal Cancer  This type of cancer can be detected and often prevented.  Routine colorectal cancer screening usually begins at 64 years of age and continues through 64 years of age.  Your health care provider may recommend screening at an earlier age if you have risk factors for colon cancer.  Your health care provider may also recommend using home test kits to check for hidden blood in the stool.  A small camera at the end of a tube can be used to  examine your colon directly (sigmoidoscopy or colonoscopy). This is done to check for the earliest forms of colorectal cancer.  Routine screening usually begins at age 29.  Direct examination of the colon should be repeated every 5-10 years through 64 years of age. However, you may need to be screened more often if early forms of precancerous polyps or small growths are found.  Skin Cancer  Check your skin from head to toe regularly.  Tell your health care provider about any new moles or changes in moles, especially if there is a change in a mole's shape or color.  Also tell your health care provider if you have a mole that is larger than the size of a pencil eraser.  Always use sunscreen. Apply sunscreen liberally and repeatedly throughout the day.  Protect yourself by wearing long sleeves, pants, a wide-brimmed hat, and sunglasses whenever you are outside.  Heart disease, diabetes, and high blood pressure  High blood pressure  causes heart disease and increases the risk of stroke. High blood pressure is more likely to develop in: ? People who have blood pressure in the high end of the normal range (130-139/85-89 mm Hg). ? People who are overweight or obese. ? People who are African American.  If you are 79-44 years of age, have your blood pressure checked every 3-5 years. If you are 46 years of age or older, have your blood pressure checked every year. You should have your blood pressure measured twice-once when you are at a hospital or clinic, and once when you are not at a hospital or clinic. Record the average of the two measurements. To check your blood pressure when you are not at a hospital or clinic, you can use: ? An automated blood pressure machine at a pharmacy. ? A home blood pressure monitor.  If you are between 94 years and 72 years old, ask your health care provider if you should take aspirin to prevent strokes.  Have regular diabetes screenings. This involves taking a  blood sample to check your fasting blood sugar level. ? If you are at a normal weight and have a low risk for diabetes, have this test once every three years after 64 years of age. ? If you are overweight and have a high risk for diabetes, consider being tested at a younger age or more often. Preventing infection Hepatitis B  If you have a higher risk for hepatitis B, you should be screened for this virus. You are considered at high risk for hepatitis B if: ? You were born in a country where hepatitis B is common. Ask your health care provider which countries are considered high risk. ? Your parents were born in a high-risk country, and you have not been immunized against hepatitis B (hepatitis B vaccine). ? You have HIV or AIDS. ? You use needles to inject street drugs. ? You live with someone who has hepatitis B. ? You have had sex with someone who has hepatitis B. ? You get hemodialysis treatment. ? You take certain medicines for conditions, including cancer, organ transplantation, and autoimmune conditions.  Hepatitis C  Blood testing is recommended for: ? Everyone born from [birth year] through 1963. ? Anyone with known risk factors for hepatitis C.  Sexually transmitted infections (STIs)  You should be screened for sexually transmitted infections (STIs) including gonorrhea and chlamydia if: ? You are sexually active and are younger than 64 years of age. ? You are older than 64 years of age and your health care provider tells you that you are at risk for this type of infection. ? Your sexual activity has changed since you were last screened and you are at an increased risk for chlamydia or gonorrhea. Ask your health care provider if you are at risk.  If you do not have HIV, but are at risk, it may be recommended that you take a prescription medicine daily to prevent HIV infection. This is called pre-exposure prophylaxis (PrEP). You are considered at risk if: ? You are sexually active and  do not regularly use condoms or know the HIV status of your partner(s). ? You take drugs by injection. ? You are sexually active with a partner who has HIV.  Talk with your health care provider about whether you are at high risk of being infected with HIV. If you choose to begin PrEP, you should first be tested for HIV. You should then be tested every 3 months for as long  as you are taking PrEP. Pregnancy  If you are premenopausal and you may become pregnant, ask your health care provider about preconception counseling.  If you may become pregnant, take 400 to 800 micrograms (mcg) of folic acid every day.  If you want to prevent pregnancy, talk to your health care provider about birth control (contraception). Osteoporosis and menopause  Osteoporosis is a disease in which the bones lose minerals and strength with aging. This can result in serious bone fractures. Your risk for osteoporosis can be identified using a bone density scan.  If you are 78 years of age or older, or if you are at risk for osteoporosis and fractures, ask your health care provider if you should be screened.  Ask your health care provider whether you should take a calcium or vitamin D supplement to lower your risk for osteoporosis.  Menopause may have certain physical symptoms and risks.  Hormone replacement therapy may reduce some of these symptoms and risks. Talk to your health care provider about whether hormone replacement therapy is right for you. Follow these instructions at home:  Schedule regular health, dental, and eye exams.  Stay current with your immunizations.  Do not use any tobacco products including cigarettes, chewing tobacco, or electronic cigarettes.  If you are pregnant, do not drink alcohol.  If you are breastfeeding, limit how much and how often you drink alcohol.  Limit alcohol intake to no more than 1 drink per day for nonpregnant women. One drink equals 12 ounces of beer, 5 ounces of  wine, or 1 ounces of hard liquor.  Do not use street drugs.  Do not share needles.  Ask your health care provider for help if you need support or information about quitting drugs.  Tell your health care provider if you often feel depressed.  Tell your health care provider if you have ever been abused or do not feel safe at home. This information is not intended to replace advice given to you by your health care provider. Make sure you discuss any questions you have with your health care provider. Document Released: 11/21/2010 Document Revised: 10/14/2015 Document Reviewed: 02/09/2015 Elsevier Interactive Patient Education  [employer].

## 2017-04-28 LAB — HEPATITIS C ANTIBODY
Hepatitis C Ab: NONREACTIVE
SIGNAL TO CUT-OFF: 0.02

## 2017-04-28 LAB — HIV ANTIBODY (ROUTINE TESTING W REFLEX): HIV 1&2 Ab, 4th Generation: NONREACTIVE

## 2017-06-11 ENCOUNTER — Other Ambulatory Visit: Payer: Self-pay | Admitting: Adult Health

## 2017-06-11 DIAGNOSIS — I10 Essential (primary) hypertension: Secondary | ICD-10-CM

## 2017-06-13 NOTE — Telephone Encounter (Signed)
Sent to the pharmacy by e-scribe. 

## 2017-08-29 ENCOUNTER — Other Ambulatory Visit: Payer: Self-pay | Admitting: Adult Health

## 2017-08-30 NOTE — Telephone Encounter (Signed)
Ok to refill + 3 

## 2017-08-30 NOTE — Telephone Encounter (Signed)
Sent to the pharmacy by e-scribe. 

## 2018-01-08 DIAGNOSIS — Z01419 Encounter for gynecological examination (general) (routine) without abnormal findings: Secondary | ICD-10-CM | POA: Diagnosis not present

## 2018-01-08 DIAGNOSIS — Z6826 Body mass index (BMI) 26.0-26.9, adult: Secondary | ICD-10-CM | POA: Diagnosis not present

## 2018-02-26 ENCOUNTER — Other Ambulatory Visit: Payer: Self-pay | Admitting: Adult Health

## 2018-02-26 DIAGNOSIS — I10 Essential (primary) hypertension: Secondary | ICD-10-CM

## 2018-02-28 NOTE — Telephone Encounter (Signed)
Sent to the pharmacy by e-scribe.  Pt due for yearly in 04/2018.

## 2018-03-08 ENCOUNTER — Other Ambulatory Visit: Payer: Self-pay | Admitting: Adult Health

## 2018-03-08 DIAGNOSIS — I10 Essential (primary) hypertension: Secondary | ICD-10-CM

## 2018-03-12 ENCOUNTER — Other Ambulatory Visit: Payer: Self-pay | Admitting: Family Medicine

## 2018-03-12 DIAGNOSIS — I10 Essential (primary) hypertension: Secondary | ICD-10-CM

## 2018-03-12 MED ORDER — BISOPROLOL FUMARATE 10 MG PO TABS: 10.0000 mg | ORAL_TABLET | Freq: Every day | ORAL | 0 refills | Status: DC

## 2018-03-12 NOTE — Telephone Encounter (Signed)
Sent to the pharmacy by e-scribe. 

## 2018-03-12 NOTE — Telephone Encounter (Signed)
Filled earlier on 03/12/18

## 2018-06-20 ENCOUNTER — Ambulatory Visit (INDEPENDENT_AMBULATORY_CARE_PROVIDER_SITE_OTHER): Payer: Medicare Other | Admitting: Adult Health

## 2018-06-20 ENCOUNTER — Encounter: Payer: Self-pay | Admitting: Adult Health

## 2018-06-20 VITALS — BP 110/70 | Temp 98.3°F | Ht 61.0 in | Wt 145.0 lb

## 2018-06-20 DIAGNOSIS — Z23 Encounter for immunization: Secondary | ICD-10-CM | POA: Diagnosis not present

## 2018-06-20 DIAGNOSIS — I10 Essential (primary) hypertension: Secondary | ICD-10-CM | POA: Diagnosis not present

## 2018-06-20 DIAGNOSIS — F418 Other specified anxiety disorders: Secondary | ICD-10-CM

## 2018-06-20 DIAGNOSIS — E785 Hyperlipidemia, unspecified: Secondary | ICD-10-CM

## 2018-06-20 DIAGNOSIS — Z0000 Encounter for general adult medical examination without abnormal findings: Secondary | ICD-10-CM | POA: Diagnosis not present

## 2018-06-20 LAB — COMPREHENSIVE METABOLIC PANEL WITH GFR
ALT: 14 U/L (ref 0–35)
AST: 16 U/L (ref 0–37)
Albumin: 4.6 g/dL (ref 3.5–5.2)
Alkaline Phosphatase: 60 U/L (ref 39–117)
BUN: 17 mg/dL (ref 6–23)
CO2: 29 meq/L (ref 19–32)
Calcium: 10 mg/dL (ref 8.4–10.5)
Chloride: 101 meq/L (ref 96–112)
Creatinine, Ser: 0.83 mg/dL (ref 0.40–1.20)
GFR: 68.79 mL/min
Glucose, Bld: 87 mg/dL (ref 70–99)
Potassium: 4.1 meq/L (ref 3.5–5.1)
Sodium: 138 meq/L (ref 135–145)
Total Bilirubin: 0.6 mg/dL (ref 0.2–1.2)
Total Protein: 7.2 g/dL (ref 6.0–8.3)

## 2018-06-20 LAB — LIPID PANEL
Cholesterol: 256 mg/dL — ABNORMAL HIGH (ref 0–200)
HDL: 52.6 mg/dL
LDL Cholesterol: 184 mg/dL — ABNORMAL HIGH (ref 0–99)
NonHDL: 203.81
Total CHOL/HDL Ratio: 5
Triglycerides: 97 mg/dL (ref 0.0–149.0)
VLDL: 19.4 mg/dL (ref 0.0–40.0)

## 2018-06-20 LAB — CBC WITH DIFFERENTIAL/PLATELET
Basophils Absolute: 0.1 10*3/uL (ref 0.0–0.1)
Basophils Relative: 1.2 % (ref 0.0–3.0)
Eosinophils Absolute: 0.3 10*3/uL (ref 0.0–0.7)
Eosinophils Relative: 3.9 % (ref 0.0–5.0)
HCT: 41.6 % (ref 36.0–46.0)
Hemoglobin: 13.8 g/dL (ref 12.0–15.0)
Lymphocytes Relative: 25 % (ref 12.0–46.0)
Lymphs Abs: 1.6 10*3/uL (ref 0.7–4.0)
MCHC: 33.3 g/dL (ref 30.0–36.0)
MCV: 86.4 fl (ref 78.0–100.0)
Monocytes Absolute: 0.5 10*3/uL (ref 0.1–1.0)
Monocytes Relative: 7.4 % (ref 3.0–12.0)
Neutro Abs: 4.1 10*3/uL (ref 1.4–7.7)
Neutrophils Relative %: 62.5 % (ref 43.0–77.0)
Platelets: 224 10*3/uL (ref 150.0–400.0)
RBC: 4.82 Mil/uL (ref 3.87–5.11)
RDW: 13.9 % (ref 11.5–15.5)
WBC: 6.6 10*3/uL (ref 4.0–10.5)

## 2018-06-20 LAB — TSH: TSH: 1.08 u[IU]/mL (ref 0.35–4.50)

## 2018-06-20 MED ORDER — HYDROCHLOROTHIAZIDE 12.5 MG PO CAPS: 12.5000 mg | ORAL_CAPSULE | Freq: Every day | ORAL | 3 refills | Status: DC

## 2018-06-20 MED ORDER — BISOPROLOL FUMARATE 10 MG PO TABS: 10.0000 mg | ORAL_TABLET | Freq: Every day | ORAL | 3 refills | Status: DC

## 2018-06-20 NOTE — Progress Notes (Signed)
Subjective:    Patient ID: Terri Gay, female    DOB: 1950-05-30, 68 y.o.   MRN: [id number]  HPI Patient presents for yearly preventative medicine examination. She is a pleasant 68 year old female who  has a past medical history of Allergy, Anxiety, Arthritis, Depression, Diverticulosis (10/07/2010), GERD (gastroesophageal reflux disease), Hemorrhoids, History of chicken pox, Hyperlipidemia, Hypertension, Knee pain, left (10/24/2013), Osteopenia (04/25/2013), Osteoporosis, Palpitations (04/26/2013), and Preventative health care (04/26/2013).  Essential hypertension-controlled with hydrochlorothiazide 12.5 mg daily and Zebeta 10 mg daily BP Readings from Last 3 Encounters:  06/20/18 110/70  04/27/17 136/86  04/26/16 132/74   Anxiety -Lexapro 10 mg daily and Klonopin 0.5 mg, 1 tablet in the morning and 2 tablets at bedtime as needed.  This is prescribed by GYN. Her mother died close to the holiday season and she is feeling slightly more depressed from this. She is now dealing with the estate and feels stressed out and drained.   Hyperlipidemia -10-year cardiac risk score was low so subsequently she was not started on any medications. Lab Results  Component Value Date   CHOL 207 (H) 04/27/2017   HDL 50.40 04/27/2017   LDLCALC 139 (H) 04/27/2017   TRIG 87.0 04/27/2017   CHOLHDL 4 04/27/2017    All immunizations and health maintenance protocols were reviewed with the patient and needed orders were placed.  She is due for Prevnar 13  Appropriate screening laboratory values were ordered for the patient including screening of hyperlipidemia, renal function and hepatic function.  Medication reconciliation,  past medical history, social history, problem list and allergies were reviewed in detail with the patient  Goals were established with regard to weight loss, exercise, and  diet in compliance with medications.   Wt Readings from Last 3 Encounters:  06/20/18 145 lb (65.8 kg)  04/27/17 141  lb (64 kg)  04/26/16 141 lb 12.8 oz (64.3 kg)     End of life planning was discussed.  She is up-to-date on routine mammograms and colonoscopy.   Review of Systems  Constitutional: Negative.   HENT: Negative.   Eyes: Negative.   Respiratory: Negative.   Cardiovascular: Negative.   Gastrointestinal: Negative.   Endocrine: Negative.   Genitourinary: Negative.   Musculoskeletal: Negative.   Skin: Negative.   Allergic/Immunologic: Negative.   Neurological: Negative.   Hematological: Negative.   Psychiatric/Behavioral: Negative.    Past Medical History:  Diagnosis Date  . Allergy    seasonal  . Anxiety   . Arthritis   . Depression   . Diverticulosis 10/07/2010   Colonoscopy  . GERD (gastroesophageal reflux disease)   . Hemorrhoids   . History of chicken pox   . Hyperlipidemia   . Hypertension   . Knee pain, left 10/24/2013  . Osteopenia 04/25/2013  . Osteoporosis   . Palpitations 04/26/2013  . Preventative health care 04/26/2013    Social History   Socioeconomic History  . Marital status: Married    Spouse name: Not on file  . Number of children: 2  . Years of education: Not on file  . Highest education level: Not on file  Occupational History  . Occupation: Research scientist (physical sciences): [employer]  . Financial resource strain: Not on file  . Food insecurity:    Worry: Not on file    Inability: Not on file  . Transportation needs:    Medical: Not on file    Non-medical: Not on file  Tobacco Use  .  Smoking status: Never Smoker  . Smokeless tobacco: Never Used  Substance and Sexual Activity  . Alcohol use: Yes    Alcohol/week: 1.0 standard drinks    Types: 1 Glasses of wine per week    Comment: occasionally  . Drug use: No  . Sexual activity: Not on file  Lifestyle  . Physical activity:    Days per week: Not on file    Minutes per session: Not on file  . Stress: Not on file  Relationships  . Social connections:    Talks on  phone: Not on file    Gets together: Not on file    Attends religious service: Not on file    Active member of club or organization: Not on file    Attends meetings of clubs or organizations: Not on file    Relationship status: Not on file  . Intimate partner violence:    Fear of current or ex partner: Not on file    Emotionally abused: Not on file    Physically abused: Not on file    Forced sexual activity: Not on file  Other Topics Concern  . Not on file  Social History Narrative   Retired from [employer]    Married for 27 years    Have one son and one daughter ( live in [place])       She likes to dance, read, and play with her dogs.          Caffeine Use: 1 cup coffee and 1 cup tea       Past Surgical History:  Procedure Laterality Date  . NO PAST SURGERIES      Family History  Problem Relation Age of Onset  . Lung cancer Father   . Lung cancer Paternal Grandfather   . Diabetes Mother   . Arthritis Mother   . Hyperlipidemia Mother   . Hypertension Mother   . Heart disease Mother        CHF  . Diabetes Maternal Grandmother   . Heart disease Maternal Grandmother   . Arthritis Maternal Grandmother   . Hyperlipidemia Maternal Grandmother   . Hypertension Maternal Grandmother   . Hyperlipidemia Sister   . Arthritis Paternal Grandmother   . Heart disease Daughter        arrythmia, Internal Cardiac Defibrillator  . Lupus Son   . Drug abuse Son   . Colon cancer Neg Hx   . Esophageal cancer Neg Hx   . Rectal cancer Neg Hx   . Stomach cancer Neg Hx     Allergies  Allergen Reactions  . Amoxicillin Diarrhea    Current Outpatient Medications on File Prior to Visit  Medication Sig Dispense Refill  . bisoprolol (ZEBETA) 10 MG tablet Take 1 tablet (10 mg total) by mouth daily. 90 tablet 0  . Calcium Carbonate (CALCIUM 600 PO) Take 1,200 mg by mouth daily.    . Cholecalciferol (VITAMIN D-3) 5000 UNITS TABS Take 5,000 Units by mouth daily.     . clonazePAM  (KLONOPIN) 1 MG tablet Take 1 mg by mouth every 8 (eight) hours as needed. for anxiety    . escitalopram (LEXAPRO) 10 MG tablet Take 1 tablet by mouth daily.  2  . estradiol (VIVELLE-DOT) 0.025 MG/24HR AS DIRECTED  4  . hydrochlorothiazide (MICROZIDE) 12.5 MG capsule TAKE 1 CAPSULE BY MOUTH ONCE DAILY 90 capsule 0  . hydrocortisone (ANUSOL-HC) 25 MG suppository INSERT ONE SUPPOSITORY RECTALLY AT BEDTIME FOR 10 DAYS 10 suppository  2  . ibuprofen (ADVIL,MOTRIN) 200 MG tablet Take 600 mg by mouth daily as needed. For pain    . magnesium gluconate (MAGONATE) 500 MG tablet Take 500 mg by mouth daily.    . Omega-3 Fatty Acids (FISH OIL) 1200 MG CAPS Take 1,200 mg by mouth daily.    . Probiotic Product (PROBIOTIC DAILY) CAPS Take 1 capsule by mouth daily.    . progesterone (PROMETRIUM) 100 MG capsule Take 200 mg by mouth at bedtime.     . RED YEAST RICE EXTRACT PO Take 4 tablets by mouth at bedtime.    . temazepam (RESTORIL) 30 MG capsule Take 30 mg by mouth at bedtime as needed for sleep.     No current facility-administered medications on file prior to visit.     BP 110/70   Temp 98.3 F (36.8 C)   Ht 5\' 1"  (1.549 m) Comment: Without shoes  Wt 145 lb (65.8 kg)   BMI 27.40 kg/m       Objective:   Physical Exam Vitals signs and nursing note reviewed.  Constitutional:      General: She is not in acute distress.    Appearance: Normal appearance. She is well-developed and normal weight.  HENT:     Head: Normocephalic and atraumatic.     Right Ear: Tympanic membrane, ear canal and external ear normal. There is no impacted cerumen.     Left Ear: Tympanic membrane, ear canal and external ear normal. There is no impacted cerumen.     Nose: Nose normal. No congestion or rhinorrhea.     Mouth/Throat:     Mouth: Mucous membranes are moist.     Pharynx: Oropharynx is clear. No oropharyngeal exudate.  Eyes:     General:        Right eye: No discharge.        Left eye: No discharge.      Extraocular Movements: Extraocular movements intact.     Conjunctiva/sclera: Conjunctivae normal.  Neck:     Musculoskeletal: Normal range of motion and neck supple.     Thyroid: No thyromegaly.     Trachea: No tracheal deviation.  Cardiovascular:     Rate and Rhythm: Normal rate and regular rhythm.     Heart sounds: Normal heart sounds. No murmur. No friction rub. No gallop.   Pulmonary:     Effort: Pulmonary effort is normal. No respiratory distress.     Breath sounds: Normal breath sounds. No stridor. No wheezing, rhonchi or rales.  Chest:     Chest wall: No tenderness.  Abdominal:     General: Bowel sounds are normal. There is no distension.     Palpations: Abdomen is soft. There is no mass.     Tenderness: There is no abdominal tenderness. There is no right CVA tenderness, left CVA tenderness, guarding or rebound.     Hernia: No hernia is present.  Musculoskeletal: Normal range of motion.        General: No swelling, tenderness, deformity or signs of injury.     Right lower leg: No edema.     Left lower leg: No edema.  Lymphadenopathy:     Cervical: No cervical adenopathy.  Skin:    General: Skin is warm and dry.     Capillary Refill: Capillary refill takes less than 2 seconds.     Coloration: Skin is not jaundiced or pale.     Findings: No bruising, erythema, lesion or rash.  Neurological:  General: No focal deficit present.     Mental Status: She is alert and oriented to person, place, and time. Mental status is at baseline.     Cranial Nerves: No cranial nerve deficit.     Coordination: Coordination normal.  Psychiatric:        Mood and Affect: Mood normal.        Behavior: Behavior normal.        Thought Content: Thought content normal.        Judgment: Judgment normal.       Assessment & Plan:  1. Routine general medical examination at a health care facility - Follow up in one year  - Encouraged heart healthy diet and exercise  - CBC with  Differential/Platelet - Comprehensive metabolic panel - Lipid panel - TSH  2. Depression with anxiety - Advised exercise. She can talk to GYN about increasing Lexapro if she feels   3. Essential hypertension - Well controlled. nO change in medications - CBC with Differential/Platelet - Comprehensive metabolic panel - Lipid panel - TSH - bisoprolol (ZEBETA) 10 MG tablet; Take 1 tablet (10 mg total) by mouth daily.  Dispense: 90 tablet; Refill: 3 - hydrochlorothiazide (MICROZIDE) 12.5 MG capsule; Take 1 capsule (12.5 mg total) by mouth daily.  Dispense: 90 capsule; Refill: 3  4. Hyperlipidemia, unspecified hyperlipidemia type - Consider statin  - CBC with Differential/Platelet - Comprehensive metabolic panel - Lipid panel - TSH  5. Need for vaccination against Streptococcus pneumoniae  - Pneumococcal conjugate vaccine 13-valent  Dorothyann Peng

## 2018-06-24 ENCOUNTER — Telehealth: Payer: Self-pay | Admitting: Adult Health

## 2018-06-24 NOTE — Telephone Encounter (Signed)
Please advise 

## 2018-06-24 NOTE — Telephone Encounter (Signed)
Copied from [facility] [id number]. Topic: General - Other >> Jun 21, 2018  4:24 PM Windy Kalata wrote: Reason for CRM: Patient called for lab results  Best call back is [phone]

## 2018-06-25 NOTE — Telephone Encounter (Signed)
Noted  

## 2018-12-20 ENCOUNTER — Other Ambulatory Visit: Payer: Self-pay | Admitting: Family Medicine

## 2018-12-20 DIAGNOSIS — E785 Hyperlipidemia, unspecified: Secondary | ICD-10-CM

## 2019-01-10 ENCOUNTER — Other Ambulatory Visit: Payer: Self-pay | Admitting: Family Medicine

## 2019-01-10 ENCOUNTER — Other Ambulatory Visit: Payer: Self-pay | Admitting: Adult Health

## 2019-01-10 ENCOUNTER — Other Ambulatory Visit: Payer: Self-pay

## 2019-01-10 ENCOUNTER — Other Ambulatory Visit (INDEPENDENT_AMBULATORY_CARE_PROVIDER_SITE_OTHER): Payer: Medicare Other

## 2019-01-10 DIAGNOSIS — E785 Hyperlipidemia, unspecified: Secondary | ICD-10-CM

## 2019-01-10 LAB — LIPID PANEL
Cholesterol: 272 mg/dL — ABNORMAL HIGH (ref 0–200)
HDL: 46.4 mg/dL
LDL Cholesterol: 198 mg/dL — ABNORMAL HIGH (ref 0–99)
NonHDL: 225.94
Total CHOL/HDL Ratio: 6
Triglycerides: 142 mg/dL (ref 0.0–149.0)
VLDL: 28.4 mg/dL (ref 0.0–40.0)

## 2019-01-10 LAB — HEPATIC FUNCTION PANEL
ALT: 11 U/L (ref 0–35)
AST: 14 U/L (ref 0–37)
Albumin: 4.6 g/dL (ref 3.5–5.2)
Alkaline Phosphatase: 57 U/L (ref 39–117)
Bilirubin, Direct: 0.1 mg/dL (ref 0.0–0.3)
Total Bilirubin: 0.3 mg/dL (ref 0.2–1.2)
Total Protein: 7.1 g/dL (ref 6.0–8.3)

## 2019-01-10 MED ORDER — ATORVASTATIN CALCIUM 20 MG PO TABS: 20.0000 mg | ORAL_TABLET | ORAL | 3 refills | Status: DC

## 2019-02-03 DIAGNOSIS — N959 Unspecified menopausal and perimenopausal disorder: Secondary | ICD-10-CM | POA: Diagnosis not present

## 2019-02-03 DIAGNOSIS — R6882 Decreased libido: Secondary | ICD-10-CM | POA: Diagnosis not present

## 2019-02-03 DIAGNOSIS — Z01419 Encounter for gynecological examination (general) (routine) without abnormal findings: Secondary | ICD-10-CM | POA: Diagnosis not present

## 2019-02-03 DIAGNOSIS — Z1231 Encounter for screening mammogram for malignant neoplasm of breast: Secondary | ICD-10-CM | POA: Diagnosis not present

## 2019-02-03 DIAGNOSIS — Z124 Encounter for screening for malignant neoplasm of cervix: Secondary | ICD-10-CM | POA: Diagnosis not present

## 2019-02-03 DIAGNOSIS — Z6827 Body mass index (BMI) 27.0-27.9, adult: Secondary | ICD-10-CM | POA: Diagnosis not present

## 2019-02-03 DIAGNOSIS — N6310 Unspecified lump in the right breast, unspecified quadrant: Secondary | ICD-10-CM | POA: Diagnosis not present

## 2019-02-04 ENCOUNTER — Other Ambulatory Visit: Payer: Self-pay | Admitting: Obstetrics and Gynecology

## 2019-03-30 ENCOUNTER — Other Ambulatory Visit: Payer: Self-pay | Admitting: Adult Health

## 2019-04-02 NOTE — Telephone Encounter (Signed)
Sent to the pharmacy by e-scribe. 

## 2019-05-23 DIAGNOSIS — C801 Malignant (primary) neoplasm, unspecified: Secondary | ICD-10-CM

## 2019-05-23 HISTORY — DX: Malignant (primary) neoplasm, unspecified: C80.1

## 2019-07-16 ENCOUNTER — Other Ambulatory Visit: Payer: Self-pay | Admitting: Adult Health

## 2019-07-16 DIAGNOSIS — I10 Essential (primary) hypertension: Secondary | ICD-10-CM

## 2019-07-17 NOTE — Telephone Encounter (Signed)
Sent to the pharmacy by e-scribe for 90 days.  Pt has upcoming cpx on 09/11/2019.

## 2019-09-10 ENCOUNTER — Other Ambulatory Visit: Payer: Self-pay

## 2019-09-11 ENCOUNTER — Ambulatory Visit (INDEPENDENT_AMBULATORY_CARE_PROVIDER_SITE_OTHER): Payer: BC Managed Care – PPO | Admitting: Adult Health

## 2019-09-11 ENCOUNTER — Encounter: Payer: Self-pay | Admitting: Adult Health

## 2019-09-11 VITALS — BP 122/70 | HR 90 | Temp 97.4°F | Wt 153.2 lb

## 2019-09-11 DIAGNOSIS — E785 Hyperlipidemia, unspecified: Secondary | ICD-10-CM | POA: Diagnosis not present

## 2019-09-11 DIAGNOSIS — Z0000 Encounter for general adult medical examination without abnormal findings: Secondary | ICD-10-CM

## 2019-09-11 DIAGNOSIS — I10 Essential (primary) hypertension: Secondary | ICD-10-CM | POA: Diagnosis not present

## 2019-09-11 DIAGNOSIS — F418 Other specified anxiety disorders: Secondary | ICD-10-CM

## 2019-09-11 LAB — LIPID PANEL
Cholesterol: 159 mg/dL (ref 0–200)
HDL: 45.9 mg/dL
LDL Cholesterol: 95 mg/dL (ref 0–99)
NonHDL: 113.16
Total CHOL/HDL Ratio: 3
Triglycerides: 93 mg/dL (ref 0.0–149.0)
VLDL: 18.6 mg/dL (ref 0.0–40.0)

## 2019-09-11 LAB — CBC WITH DIFFERENTIAL/PLATELET
Basophils Absolute: 0 10*3/uL (ref 0.0–0.1)
Basophils Relative: 0.8 % (ref 0.0–3.0)
Eosinophils Absolute: 0.2 10*3/uL (ref 0.0–0.7)
Eosinophils Relative: 4.1 % (ref 0.0–5.0)
HCT: 36.5 % (ref 36.0–46.0)
Hemoglobin: 12.1 g/dL (ref 12.0–15.0)
Lymphocytes Relative: 9 % — ABNORMAL LOW (ref 12.0–46.0)
Lymphs Abs: 0.5 10*3/uL — ABNORMAL LOW (ref 0.7–4.0)
MCHC: 33.1 g/dL (ref 30.0–36.0)
MCV: 87.5 fl (ref 78.0–100.0)
Monocytes Absolute: 0.6 10*3/uL (ref 0.1–1.0)
Monocytes Relative: 9.6 % (ref 3.0–12.0)
Neutro Abs: 4.6 10*3/uL (ref 1.4–7.7)
Neutrophils Relative %: 76.5 % (ref 43.0–77.0)
Platelets: 169 10*3/uL (ref 150.0–400.0)
RBC: 4.17 Mil/uL (ref 3.87–5.11)
RDW: 13.5 % (ref 11.5–15.5)
WBC: 6 10*3/uL (ref 4.0–10.5)

## 2019-09-11 LAB — COMPREHENSIVE METABOLIC PANEL WITH GFR
ALT: 13 U/L (ref 0–35)
AST: 15 U/L (ref 0–37)
Albumin: 4.3 g/dL (ref 3.5–5.2)
Alkaline Phosphatase: 65 U/L (ref 39–117)
BUN: 12 mg/dL (ref 6–23)
CO2: 30 meq/L (ref 19–32)
Calcium: 9.4 mg/dL (ref 8.4–10.5)
Chloride: 102 meq/L (ref 96–112)
Creatinine, Ser: 0.79 mg/dL (ref 0.40–1.20)
GFR: 72.55 mL/min
Glucose, Bld: 98 mg/dL (ref 70–99)
Potassium: 3.8 meq/L (ref 3.5–5.1)
Sodium: 140 meq/L (ref 135–145)
Total Bilirubin: 0.3 mg/dL (ref 0.2–1.2)
Total Protein: 6.7 g/dL (ref 6.0–8.3)

## 2019-09-11 LAB — TSH: TSH: 0.95 u[IU]/mL (ref 0.35–4.50)

## 2019-09-11 NOTE — Progress Notes (Signed)
Subjective:    Patient ID: Terri Gay, female    DOB: December 13, 1950, 69 y.o.   MRN: [id number]  HPI Patient presents for yearly preventative medicine examination. She is a pleasant 69 year old female who  has a past medical history of Allergy, Anxiety, Arthritis, Depression, Diverticulosis (10/07/2010), GERD (gastroesophageal reflux disease), Hemorrhoids, History of chicken pox, Hyperlipidemia, Hypertension, Knee pain, left (10/24/2013), Osteopenia (04/25/2013), Osteoporosis, Palpitations (04/26/2013), and Preventative health care (04/26/2013).  Essential Hypertension -controlled with hydrochlorothiazide 12.5 mg daily and Zebeta 10 mg daily.  She denies dizziness, lightheadedness, chest pain, or shortness of breath BP Readings from Last 3 Encounters:  09/11/19 122/70  06/20/18 110/70  04/27/17 136/86   Anxiety  - Takes  Klonopin 0.5 mg, 1 tablet in the morning and 2 tablets at bedtime as needed. She has weaned herself off Lexapro   Hyperlipidemia -during her last physical her cholesterol panel was elevated.  She wanted to work on lifestyle modifications and restarted taking red yeast rice.  We had her come back in August 2020 at which time her cholesterol panel was still elevated.  We started her on Lipitor 20 mg and she is taking this every other day. She denies myalgia or fatigue  Lab Results  Component Value Date   CHOL 272 (H) 01/10/2019   HDL 46.40 01/10/2019   LDLCALC 198 (H) 01/10/2019   TRIG 142.0 01/10/2019   CHOLHDL 6 01/10/2019    All immunizations and health maintenance protocols were reviewed with the patient and needed orders were placed.  She is due for Pneumovax 23- refuses   Appropriate screening laboratory values were ordered for the patient including screening of hyperlipidemia, renal function and hepatic function.  Medication reconciliation,  past medical history, social history, problem list and allergies were reviewed in detail with the patient  Goals were established  with regard to weight loss, exercise, and  diet in compliance with medications. She has started exercising.  Wt Readings from Last 3 Encounters:  09/11/19 153 lb 3.2 oz (69.5 kg)  06/20/18 145 lb (65.8 kg)  04/27/17 141 lb (64 kg)     She is up-to-date on routine colon cancer screening, will be due in May 2022 as well as mammogram and PAP   Review of Systems  Constitutional: Negative.   HENT: Negative.   Eyes: Negative.   Respiratory: Negative.   Cardiovascular: Negative.   Gastrointestinal: Negative.   Endocrine: Negative.   Genitourinary: Negative.   Musculoskeletal: Negative.   Skin: Negative.   Allergic/Immunologic: Negative.   Neurological: Negative.   Hematological: Negative.   Psychiatric/Behavioral: Negative.    Past Medical History:  Diagnosis Date  . Allergy    seasonal  . Anxiety   . Arthritis   . Depression   . Diverticulosis 10/07/2010   Colonoscopy  . GERD (gastroesophageal reflux disease)   . Hemorrhoids   . History of chicken pox   . Hyperlipidemia   . Hypertension   . Knee pain, left 10/24/2013  . Osteopenia 04/25/2013  . Osteoporosis   . Palpitations 04/26/2013  . Preventative health care 04/26/2013    Social History   Socioeconomic History  . Marital status: Married    Spouse name: Not on file  . Number of children: 2  . Years of education: Not on file  . Highest education level: Not on file  Occupational History  . Occupation: Research scientist (physical sciences): [employer]  Tobacco Use  . Smoking status: Never Smoker  . Smokeless  tobacco: Never Used  Substance and Sexual Activity  . Alcohol use: Yes    Alcohol/week: 1.0 standard drinks    Types: 1 Glasses of wine per week    Comment: occasionally  . Drug use: No  . Sexual activity: Not on file  Other Topics Concern  . Not on file  Social History Narrative   Retired from [employer]    Married for 27 years    Have one son and one daughter ( live in [place])       She  likes to dance, read, and play with her dogs.          Caffeine Use: 1 cup coffee and 1 cup tea      Social Determinants of Health   Financial Resource Strain:   . Difficulty of Paying Living Expenses:   Food Insecurity:   . Worried About Charity fundraiser in the Last Year:   . Arboriculturist in the Last Year:   Transportation Needs:   . Film/video editor (Medical):   Marland Kitchen Lack of Transportation (Non-Medical):   Physical Activity:   . Days of Exercise per Week:   . Minutes of Exercise per Session:   Stress:   . Feeling of Stress :   Social Connections:   . Frequency of Communication with Friends and Family:   . Frequency of Social Gatherings with Friends and Family:   . Attends Religious Services:   . Active Member of Clubs or Organizations:   . Attends Archivist Meetings:   Marland Kitchen Marital Status:   Intimate Partner Violence:   . Fear of Current or Ex-Partner:   . Emotionally Abused:   Marland Kitchen Physically Abused:   . Sexually Abused:     Past Surgical History:  Procedure Laterality Date  . NO PAST SURGERIES      Family History  Problem Relation Age of Onset  . Lung cancer Father   . Lung cancer Paternal Grandfather   . Diabetes Mother   . Arthritis Mother   . Hyperlipidemia Mother   . Hypertension Mother   . Heart disease Mother        CHF  . Diabetes Maternal Grandmother   . Heart disease Maternal Grandmother   . Arthritis Maternal Grandmother   . Hyperlipidemia Maternal Grandmother   . Hypertension Maternal Grandmother   . Hyperlipidemia Sister   . Arthritis Paternal Grandmother   . Heart disease Daughter        arrythmia, Internal Cardiac Defibrillator  . Lupus Son   . Drug abuse Son   . Colon cancer Neg Hx   . Esophageal cancer Neg Hx   . Rectal cancer Neg Hx   . Stomach cancer Neg Hx     Allergies  Allergen Reactions  . Amoxicillin Diarrhea    Current Outpatient Medications on File Prior to Visit  Medication Sig Dispense Refill  .  atorvastatin (LIPITOR) 20 MG tablet Take 1 tablet (20 mg total) by mouth every other day. 45 tablet 3  . bisoprolol (ZEBETA) 10 MG tablet Take 1 tablet by mouth once daily 90 tablet 0  . Calcium Carbonate (CALCIUM 600 PO) Take 1,200 mg by mouth daily.    . Cholecalciferol (VITAMIN D-3) 5000 UNITS TABS Take 5,000 Units by mouth daily.     . clonazePAM (KLONOPIN) 1 MG tablet Take 1 mg by mouth every 8 (eight) hours as needed. for anxiety    . estradiol (VIVELLE-DOT) 0.025 MG/24HR AS  DIRECTED  4  . hydrochlorothiazide (MICROZIDE) 12.5 MG capsule Take 1 capsule by mouth once daily 90 capsule 0  . hydrocortisone (ANUCORT-HC) 25 MG suppository INSERT 1 SUPPOSITORY RECTALLY AT BEDTIME FOR 10 DAYS 10 suppository 0  . ibuprofen (ADVIL,MOTRIN) 200 MG tablet Take 600 mg by mouth daily as needed. For pain    . magnesium gluconate (MAGONATE) 500 MG tablet Take 500 mg by mouth daily.    . Omega-3 Fatty Acids (FISH OIL) 1200 MG CAPS Take 1,200 mg by mouth daily.    . Probiotic Product (PROBIOTIC DAILY) CAPS Take 1 capsule by mouth daily.    . progesterone (PROMETRIUM) 100 MG capsule Take 200 mg by mouth at bedtime.     . RED YEAST RICE EXTRACT PO Take 4 tablets by mouth at bedtime.    . temazepam (RESTORIL) 30 MG capsule Take 30 mg by mouth at bedtime as needed for sleep.     No current facility-administered medications on file prior to visit.    BP 122/70 (BP Location: Left Arm, Patient Position: Sitting, Cuff Size: Normal)   Pulse 90   Temp (!) 97.4 F (36.3 C) (Temporal)   Wt 153 lb 3.2 oz (69.5 kg)   SpO2 96%   BMI 28.95 kg/m       Objective:   Physical Exam Vitals and nursing note reviewed.  Constitutional:      General: She is not in acute distress.    Appearance: Normal appearance. She is well-developed. She is not ill-appearing.  HENT:     Head: Normocephalic and atraumatic.     Right Ear: Tympanic membrane, ear canal and external ear normal. There is no impacted cerumen.     Left  Ear: Tympanic membrane, ear canal and external ear normal. There is no impacted cerumen.     Nose: Nose normal. No congestion or rhinorrhea.     Mouth/Throat:     Mouth: Mucous membranes are moist.     Pharynx: Oropharynx is clear. No oropharyngeal exudate or posterior oropharyngeal erythema.  Eyes:     General:        Right eye: No discharge.        Left eye: No discharge.     Extraocular Movements: Extraocular movements intact.     Conjunctiva/sclera: Conjunctivae normal.     Pupils: Pupils are equal, round, and reactive to light.  Neck:     Thyroid: No thyromegaly.     Vascular: No carotid bruit.     Trachea: No tracheal deviation.  Cardiovascular:     Rate and Rhythm: Normal rate and regular rhythm.     Pulses: Normal pulses.     Heart sounds: Normal heart sounds. No murmur. No friction rub. No gallop.   Pulmonary:     Effort: Pulmonary effort is normal. No respiratory distress.     Breath sounds: Normal breath sounds. No stridor. No wheezing, rhonchi or rales.  Chest:     Chest wall: No tenderness.  Abdominal:     General: Abdomen is flat. Bowel sounds are normal. There is no distension.     Palpations: Abdomen is soft. There is no mass.     Tenderness: There is no abdominal tenderness. There is no right CVA tenderness, left CVA tenderness, guarding or rebound.     Hernia: No hernia is present.  Musculoskeletal:        General: No swelling, tenderness, deformity or signs of injury. Normal range of motion.     Cervical back: Normal  range of motion and neck supple.     Right lower leg: No edema.     Left lower leg: No edema.  Lymphadenopathy:     Cervical: No cervical adenopathy.  Skin:    General: Skin is warm and dry.     Coloration: Skin is not jaundiced or pale.     Findings: No bruising, erythema, lesion or rash.  Neurological:     General: No focal deficit present.     Mental Status: She is alert and oriented to person, place, and time.     Cranial Nerves: No  cranial nerve deficit.     Sensory: No sensory deficit.     Motor: No weakness.     Coordination: Coordination normal.     Gait: Gait normal.     Deep Tendon Reflexes: Reflexes normal.  Psychiatric:        Mood and Affect: Mood normal.        Behavior: Behavior normal.        Thought Content: Thought content normal.        Judgment: Judgment normal.       Assessment & Plan:    1. Hyperlipidemia, unspecified hyperlipidemia type - Will have her take Lipitor every day - CBC with Differential/Platelet - Comprehensive metabolic panel - Lipid panel - TSH  2. Essential hypertension - BP well controlled. No change in medications  - CBC with Differential/Platelet - Comprehensive metabolic panel - Lipid panel - TSH  3. Depression with anxiety - Continue with Klonopin as needed  Dorothyann Peng, NP

## 2019-09-12 ENCOUNTER — Telehealth: Payer: Self-pay | Admitting: Adult Health

## 2019-09-12 ENCOUNTER — Other Ambulatory Visit: Payer: Self-pay | Admitting: Family Medicine

## 2019-09-12 MED ORDER — ATORVASTATIN CALCIUM 20 MG PO TABS: 20.0000 mg | ORAL_TABLET | Freq: Every day | ORAL | 3 refills | Status: DC

## 2019-09-12 NOTE — Telephone Encounter (Signed)
NOTED

## 2019-09-12 NOTE — Telephone Encounter (Signed)
Pt would like a call to go over lab work for her and her husband Maquita Munda) that both had labs done 09/11/19

## 2019-09-12 NOTE — Telephone Encounter (Signed)
Sent to the pharmacy by e-scribe. 

## 2019-09-16 ENCOUNTER — Telehealth: Payer: Self-pay | Admitting: Adult Health

## 2019-09-16 NOTE — Telephone Encounter (Signed)
Spoke to the pt.  Reminded her that she can take every other day if easier to manage.  Nothing further needed.

## 2019-09-16 NOTE — Telephone Encounter (Signed)
Pt states that she picked up her medication for Lipitor and the dosage has changed. She states that she is use to it being 20 mg (one tablet) once every other day but now the bottle states 20mg  once daily. She would like clarity on the dosage of her medication.    Pt can be reached at [phone] -ok to leave a detailed message per pt

## 2019-10-14 ENCOUNTER — Other Ambulatory Visit: Payer: Self-pay | Admitting: Adult Health

## 2019-10-14 DIAGNOSIS — I10 Essential (primary) hypertension: Secondary | ICD-10-CM

## 2019-12-29 ENCOUNTER — Other Ambulatory Visit: Payer: Self-pay | Admitting: Adult Health

## 2019-12-29 DIAGNOSIS — I10 Essential (primary) hypertension: Secondary | ICD-10-CM

## 2019-12-30 NOTE — Telephone Encounter (Signed)
Sent to the pharmacy by e-scribe. 

## 2020-01-30 ENCOUNTER — Other Ambulatory Visit: Payer: Self-pay | Admitting: Adult Health

## 2020-01-30 NOTE — Telephone Encounter (Signed)
Rx says to use for 10 days. Should the patient continue this medication.

## 2020-02-04 ENCOUNTER — Telehealth: Payer: Self-pay | Admitting: Adult Health

## 2020-02-04 NOTE — Telephone Encounter (Signed)
Spoke with patient she requested that I call her back in Jan 2022

## 2020-02-27 DIAGNOSIS — Z6826 Body mass index (BMI) 26.0-26.9, adult: Secondary | ICD-10-CM | POA: Diagnosis not present

## 2020-02-27 DIAGNOSIS — Z01419 Encounter for gynecological examination (general) (routine) without abnormal findings: Secondary | ICD-10-CM | POA: Diagnosis not present

## 2020-02-27 DIAGNOSIS — F419 Anxiety disorder, unspecified: Secondary | ICD-10-CM | POA: Diagnosis not present

## 2020-02-27 DIAGNOSIS — N6310 Unspecified lump in the right breast, unspecified quadrant: Secondary | ICD-10-CM | POA: Diagnosis not present

## 2020-03-01 DIAGNOSIS — N6312 Unspecified lump in the right breast, upper inner quadrant: Secondary | ICD-10-CM | POA: Diagnosis not present

## 2020-03-01 DIAGNOSIS — N6012 Diffuse cystic mastopathy of left breast: Secondary | ICD-10-CM | POA: Diagnosis not present

## 2020-03-01 DIAGNOSIS — R922 Inconclusive mammogram: Secondary | ICD-10-CM | POA: Diagnosis not present

## 2020-03-01 DIAGNOSIS — N6311 Unspecified lump in the right breast, upper outer quadrant: Secondary | ICD-10-CM | POA: Diagnosis not present

## 2020-03-09 ENCOUNTER — Other Ambulatory Visit: Payer: Self-pay | Admitting: Radiology

## 2020-03-09 DIAGNOSIS — Z170 Estrogen receptor positive status [ER+]: Secondary | ICD-10-CM | POA: Diagnosis not present

## 2020-03-09 DIAGNOSIS — C50811 Malignant neoplasm of overlapping sites of right female breast: Secondary | ICD-10-CM | POA: Diagnosis not present

## 2020-03-09 DIAGNOSIS — N6315 Unspecified lump in the right breast, overlapping quadrants: Secondary | ICD-10-CM | POA: Diagnosis not present

## 2020-03-11 ENCOUNTER — Encounter: Payer: Self-pay | Admitting: *Deleted

## 2020-03-11 DIAGNOSIS — C50411 Malignant neoplasm of upper-outer quadrant of right female breast: Secondary | ICD-10-CM | POA: Insufficient documentation

## 2020-03-11 DIAGNOSIS — Z170 Estrogen receptor positive status [ER+]: Secondary | ICD-10-CM | POA: Insufficient documentation

## 2020-03-12 ENCOUNTER — Telehealth: Payer: Self-pay | Admitting: Oncology

## 2020-03-12 NOTE — Telephone Encounter (Signed)
Spoke with patient to confirm [facility] for 10/27, explained surgeon's office will be calling, Teola Bradley will send packet to patient

## 2020-03-16 NOTE — Progress Notes (Signed)
[place]  [phone] [phone]     ID: Terri Gay DOB: 04/28/1951  MR#: [id number]  YBO#:[id number]  Patient Care Team: Terri Peng, NP as PCP - General (Family Medicine) Terri Queen, MD (Obstetrics and Gynecology) Terri Kaufmann, RN as Oncology Nurse Navigator Terri Germany, RN as Oncology Nurse Navigator Terri Kussmaul, MD as Consulting Physician (General Surgery) Terri Gay, Terri Dad, MD as Consulting Physician (Oncology) Terri Pray, MD as Consulting Physician (Radiation Oncology) Terri Cruel, MD OTHER MD:  CHIEF COMPLAINT: Estrogen receptor positive breast cancer  CURRENT TREATMENT: Awaiting definitive surgery  HISTORY OF CURRENT ILLNESS: Terri Gay herself palpated an upper-inner right breast lump. She underwent bilateral diagnostic mammography with tomography and bilateral breast ultrasonography at [facility] on 03/01/2020 showing: breast density category D; 1.6 cm palpable right breast mass at 12 o'clock; benign simply cysts in left breast at 9 o'clock, 1 cm and 1.1 cm; benign oval cysts in right breast at 11 o'clock.  At the breast conference this morning it was reported that the axilla was negative by ultrasound  Accordingly on 03/09/2020 she proceeded to biopsy of the right breast area in question. The pathology from this procedure ([id number]) showed: invasive and in situ mammary carcinoma, e-cadherin negative, grade 2. Prognostic indicators significant for: estrogen receptor, 95% positive and progesterone receptor, 20% positive, both with strong staining intensity. Proliferation marker Ki67 at 10%. HER2 negative by immunohistochemistry (1+).  The patient's subsequent history is as detailed below.   INTERVAL HISTORY: Terri Gay was evaluated in the multidisciplinary breast cancer clinic on 03/17/2020 accompanied by her daughter Terri Gay. Her case was also presented at the multidisciplinary breast cancer conference on the same day.  At that time a preliminary plan was proposed: Genetics testing, breast conserving surgery, antiestrogens adjuvant radiation   REVIEW OF SYSTEMS: On the provided questionnaire, Terri Gay reports loss of sleep, wearing glasses, allergies, hemorrhoids, palpable breast lump, easy bruising, arthritis, anxiety, and depression. The patient denies unusual headaches, visual changes, nausea, vomiting, stiff neck, dizziness, or gait imbalance. There has been no cough, phlegm production, or pleurisy, no chest pain or pressure, and no change in bowel or bladder habits. The patient denies fever, rash, bleeding, unexplained fatigue or unexplained weight loss.  She has not received the COVID-19 vaccine she says for religious reasons.  "There are things in the vaccine that I do not agree with".  A detailed review of systems was otherwise entirely negative.   PAST MEDICAL HISTORY: Past Medical History:  Diagnosis Date   Allergy    seasonal   Anxiety    Arthritis    Depression    Diverticulosis 10/07/2010   Colonoscopy   GERD (gastroesophageal reflux disease)    Hemorrhoids    History of chicken pox    Hyperlipidemia    Hypertension    Knee pain, left 10/24/2013   Osteopenia 04/25/2013   Osteoporosis    Palpitations 04/26/2013   Preventative health care 04/26/2013    PAST SURGICAL HISTORY: Past Surgical History:  Procedure Laterality Date   NO PAST SURGERIES      FAMILY HISTORY: Family History  Problem Relation Age of Onset   Lung cancer Father    Lung cancer Paternal Grandfather    Diabetes Mother    Arthritis Mother    Hyperlipidemia Mother    Hypertension Mother    Heart disease Mother        CHF   Diabetes Maternal Grandmother    Heart disease Maternal Grandmother  Arthritis Maternal Grandmother    Hyperlipidemia Maternal Grandmother    Hypertension Maternal Grandmother    Hyperlipidemia Sister    Arthritis Paternal Grandmother    Heart disease Daughter         arrythmia, Internal Cardiac Defibrillator   Lupus Son    Drug abuse Son    Colon cancer Neg Hx    Esophageal cancer Neg Hx    Rectal cancer Neg Hx    Stomach cancer Neg Hx    Her father died at age 33 from lung cancer. He had a history of tobacco use, as well as asbestos exposure while in the [employer]. Her mother died at age 56 from dementia. Terri Gay has one brother and one sister. In addition to her father, she reports lung cancer in her paternal grandfather, who also used tobacco, around age 68.   GYNECOLOGIC HISTORY:  No LMP recorded. Patient is postmenopausal. Menarche: [age] years old Age at first live birth: 69 years old Terri Gay P 2 LMP age 71 Contraceptive: used for about 30 years HRT: used for 6 years, stopped following cancer diagnosis  Hysterectomy? no BSO? 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 Husband Terri Gay works in Press photographer for [employer] Daughter Terri Gay, age 53, works in Therapist, art in [place]. Son Terri Gay, age 67, lives in [place], [place].  The patient is not sure what his current occupation is.  Terri Gay attends [employer], as well as [employer] on Tuesdays.    ADVANCED DIRECTIVES: In the absence of any documentation to the contrary, the patient's spouse is their HCPOA.    HEALTH MAINTENANCE: Social History   Tobacco Use   Smoking status: Never Smoker   Smokeless tobacco: Never Used  Substance Use Topics   Alcohol use: Yes    Alcohol/week: 1.0 standard drink    Types: 1 Glasses of wine per week    Comment: occasionally   Drug use: No     Colonoscopy: 09/2010, Dr. Maurene Capes  PAP: 02/2020  Bone density: 01/2019, osteopenia   Allergies  Allergen Reactions   Amoxicillin Diarrhea    Current Outpatient Medications  Medication Sig Dispense Refill   atorvastatin (LIPITOR) 20 MG tablet Take 1 tablet (20 mg total) by mouth daily. 90 tablet 3    Bioflavonoid Products (VITAMIN C PLUS) 1000 MG TABS Vitamin C     bisoprolol (ZEBETA) 10 MG tablet Take 1 tablet by mouth once daily 90 tablet 2   Cetirizine HCl (ZYRTEC ALLERGY) 10 MG CAPS Zyrtec     Cholecalciferol (VITAMIN D-3) 5000 UNITS TABS Take 5,000 Units by mouth daily.      clonazePAM (KLONOPIN) 1 MG tablet Take 1 mg by mouth every 8 (eight) hours as needed. for anxiety     escitalopram (LEXAPRO) 10 MG tablet Take 10 mg by mouth daily.     estradiol (VIVELLE-DOT) 0.025 MG/24HR AS DIRECTED  4   hydrochlorothiazide (MICROZIDE) 12.5 MG capsule Take 1 capsule by mouth once daily 90 capsule 2   magnesium gluconate (MAGONATE) 500 MG tablet Take 500 mg by mouth daily.     Omega-3 Fatty Acids (FISH OIL) 1200 MG CAPS Take 1,200 mg by mouth daily.     progesterone (PROMETRIUM) 200 MG capsule Take 200 mg by mouth at bedtime.     temazepam (RESTORIL) 30 MG capsule Take 30 mg by mouth at bedtime as needed.     Zinc 50 MG  CAPS zinc     hydrocortisone (ANUCORT-HC) 25 MG suppository INSERT 1 SUPPOSITORY RECTALLY AT BEDTIME FOR 10 DAYS (Patient not taking: Reported on 03/17/2020) 24 suppository 0   No current facility-administered medications for this visit.    OBJECTIVE: Blood woman who appears stated age  31:   03/17/20 1243  BP: (!) 146/92  Pulse: 70  Resp: 18  Temp: 97.9 F (36.6 C)  SpO2: 97%     Body mass index is 26.58 kg/m.   Wt Readings from Last 3 Encounters:  03/17/20 143 lb (64.9 kg)  09/11/19 153 lb 3.2 oz (69.5 kg)  06/20/18 145 lb (65.8 kg)      ECOG FS:1 - Symptomatic but completely ambulatory  Ocular: Sclerae unicteric, pupils round and equal Ear-nose-throat: Wearing a mask Lymphatic: No cervical or supraclavicular adenopathy Lungs no rales or rhonchi Heart regular rate and rhythm Abd soft, nontender, positive bowel sounds MSK no focal spinal tenderness, no joint edema Neuro: non-focal, well-oriented, appropriate affect Breasts: The right  breast is status post recent biopsy.  There is an easily palpable mass in the anterior right breast which is however not the cancer but a cyst.  The left breast is benign.  Both axillae are benign.   LAB RESULTS:  CMP     Component Value Date/Time   NA 141 03/17/2020 1223   K 3.8 03/17/2020 1223   CL 105 03/17/2020 1223   CO2 29 03/17/2020 1223   GLUCOSE 95 03/17/2020 1223   BUN 16 03/17/2020 1223   CREATININE 0.86 03/17/2020 1223   CREATININE 0.71 04/25/2013 1044   CALCIUM 9.7 03/17/2020 1223   PROT 6.9 03/17/2020 1223   ALBUMIN 4.1 03/17/2020 1223   AST 14 (L) 03/17/2020 1223   ALT 12 03/17/2020 1223   ALKPHOS 61 03/17/2020 1223   BILITOT 0.3 03/17/2020 1223   GFRNONAA >60 03/17/2020 1223   GFRAA >90 09/13/2012 1057    No results found for: TOTALPROTELP, ALBUMINELP, A1GS, A2GS, BETS, BETA2SER, GAMS, MSPIKE, SPEI  Lab Results  Component Value Date   WBC 8.7 03/17/2020   NEUTROABS 5.9 03/17/2020   HGB 11.8 (L) 03/17/2020   HCT 36.3 03/17/2020   MCV 89.9 03/17/2020   PLT 211 03/17/2020    No results found for: LABCA2  No components found for: HMCNOB096  No results for input(s): INR in the last 168 hours.  No results found for: LABCA2  No results found for: GEZ662  No results found for: HUT654  No results found for: YTK354  No results found for: CA2729  No components found for: HGQUANT  No results found for: CEA1 / No results found for: CEA1   No results found for: AFPTUMOR  No results found for: CHROMOGRNA  No results found for: KPAFRELGTCHN, LAMBDASER, KAPLAMBRATIO (kappa/lambda light chains)  No results found for: HGBA, HGBA2QUANT, HGBFQUANT, HGBSQUAN (Hemoglobinopathy evaluation)   No results found for: LDH  No results found for: IRON, TIBC, IRONPCTSAT (Iron and TIBC)  No results found for: FERRITIN  Urinalysis    Component Value Date/Time   COLORURINE YELLOW 09/13/2012 [place] 09/13/2012 1207   LABSPEC 1.006  09/13/2012 1207   PHURINE 6.5 09/13/2012 [place] 09/13/2012 [place] 09/13/2012 1207   BILIRUBINUR neg 04/26/2016 [place] 09/13/2012 1207   PROTEINUR neg 04/26/2016 0854   PROTEINUR NEGATIVE 09/13/2012 1207   UROBILINOGEN 0.2 04/26/2016 0854   UROBILINOGEN 0.2 09/13/2012 1207   NITRITE neg 04/26/2016  [place] NEGATIVE 09/13/2012 [place] (A) 04/26/2016 0854     STUDIES: No results found.   ELIGIBLE FOR AVAILABLE RESEARCH PROTOCOL: AET  ASSESSMENT: 69 y.o. [place] woman status post right breast upper outer quadrant biopsy 03/09/2020 for a clinical T1c N0, stage IA invasive lobular carcinoma, E-cadherin negative, estrogen and progesterone receptor positive, HER-2 not amplified, with an MIB-1 of 10%.  (1) definitive surgery pending  (2) Oncotype to be obtained from the definitive surgical sample: Chemotherapy not anticipated  (3) adjuvant radiation  (4) genetics testing  (5) antiestrogens to follow the completion of local treatment  PLAN: I met today with Terri Gay to review her new diagnosis. Specifically we discussed the biology of her breast cancer, its diagnosis, staging, treatment  options and prognosis. We first reviewed the fact that cancer is not one disease but more than 100 different diseases and that it is important to keep them separate-- otherwise when friends and relatives discuss their own cancer experiences with Terri Gay confusion can result. Similarly we explained that if breast cancer spreads to the bone or liver, the patient would not have bone cancer or liver cancer, but breast cancer in the bone and breast cancer in the liver: one cancer in three places-- not 3 different cancers which otherwise would have to be treated in 3 different ways.  We discussed the difference between local and systemic therapy. In terms of loco-regional treatment, lumpectomy plus radiation is equivalent to mastectomy as far  as survival is concerned. For this reason, and because the cosmetic results are generally superior, we recommend breast conserving surgery.   We then discussed the rationale for systemic therapy. There is some risk that this cancer may have already spread to other parts of her body. Patients frequently ask at this point about bone scans, CAT scans and PET scans to find out if they have occult breast cancer somewhere else. The problem is that in early stage disease we are much more likely to find false positives then true cancers and this would expose the patient to unnecessary procedures as well as unnecessary radiation. Scans cannot answer the question the patient really would like to know, which is whether she has microscopic disease elsewhere in her body. For those reasons we do not recommend them.  Of course we would proceed to aggressive evaluation of any symptoms that might suggest metastatic disease, but that is not the case here.  Next we went over the options for systemic therapy which are anti-estrogens, anti-HER-2 immunotherapy, and chemotherapy. Terri Gay does not meet criteria for anti-HER-2 immunotherapy. She is a good candidate for anti-estrogens.  The question of chemotherapy is more complicated. Chemotherapy is most effective in rapidly growing, aggressive tumors. It is much less effective in slow growing cancers, like Terri Gay 's. For that reason we are going to request an Oncotype from the definitive surgical sample, as suggested by NCCN guidelines. That will help [place] make a definitive decision regarding chemotherapy in this case.  Terri Gay also meets criteria for genetics testing. In patients who carry a deleterious mutation [for example in a  BRCA gene], the risk of a new breast cancer developing in the future may be sufficiently great that the patient may choose bilateral mastectomies. However if she wishes to keep her breasts in that situation it is safe to do so. That would require intensified  screening, which generally means not only yearly mammography but a yearly breast MRI as well.   Accordingly the plan is for  breast conserving surgery, Oncotype testing, adjuvant radiation, and antiestrogens after genetics results are not  Terri Gay has a good understanding of the overall plan. She agrees with it. She knows the goal of treatment in her case is cure. She will call with any problems that may develop before her next visit here.  Total encounter time 65 minutes.  Terri Gay. Jerami Tammen, MD 03/17/2020 3:22 PM Medical Oncology and Hematology [facility] [place], [place] [address] Tel. [phone]    Fax. [phone]   This document serves as a record of services personally performed by Lurline Del, MD. It was created on his behalf by Wilburn Mylar, a trained medical scribe. The creation of this record is based on the scribe's personal observations and the provider's statements to them.   I, Lurline Del MD, have reviewed the above documentation for accuracy and completeness, and I agree with the above.    *Total Encounter Time as defined by the Centers for Medicare and Medicaid Services includes, in addition to the face-to-face time of a patient visit (documented in the note above) non-face-to-face time: obtaining and reviewing outside history, ordering and reviewing medications, tests or procedures, care coordination (Gay with other health care professionals or caregivers) and documentation in the medical record.

## 2020-03-17 ENCOUNTER — Encounter: Payer: Self-pay | Admitting: *Deleted

## 2020-03-17 ENCOUNTER — Inpatient Hospital Stay: Payer: BC Managed Care – PPO | Attending: Ophthalmology

## 2020-03-17 ENCOUNTER — Ambulatory Visit
Admission: RE | Admit: 2020-03-17 | Discharge: 2020-03-17 | Disposition: A | Discharge status: Home / Self Care | Payer: BC Managed Care – PPO | Source: Ambulatory Visit | Attending: Radiation Oncology | Admitting: Radiation Oncology

## 2020-03-17 ENCOUNTER — Other Ambulatory Visit: Payer: Self-pay

## 2020-03-17 ENCOUNTER — Ambulatory Visit: Payer: BC Managed Care – PPO | Attending: General Surgery | Admitting: Physical Therapy

## 2020-03-17 ENCOUNTER — Encounter: Payer: Self-pay | Admitting: Physical Therapy

## 2020-03-17 ENCOUNTER — Ambulatory Visit: Payer: Self-pay | Admitting: General Surgery

## 2020-03-17 ENCOUNTER — Inpatient Hospital Stay (HOSPITAL_BASED_OUTPATIENT_CLINIC_OR_DEPARTMENT_OTHER): Payer: BC Managed Care – PPO | Admitting: Oncology

## 2020-03-17 ENCOUNTER — Other Ambulatory Visit: Payer: Self-pay | Admitting: *Deleted

## 2020-03-17 VITALS — BP 146/92 | HR 70 | Temp 97.9°F | Resp 18 | Ht 61.5 in | Wt 143.0 lb

## 2020-03-17 DIAGNOSIS — C50211 Malignant neoplasm of upper-inner quadrant of right female breast: Secondary | ICD-10-CM | POA: Diagnosis not present

## 2020-03-17 DIAGNOSIS — Z170 Estrogen receptor positive status [ER+]: Secondary | ICD-10-CM | POA: Diagnosis present

## 2020-03-17 DIAGNOSIS — R293 Abnormal posture: Secondary | ICD-10-CM | POA: Insufficient documentation

## 2020-03-17 DIAGNOSIS — C50411 Malignant neoplasm of upper-outer quadrant of right female breast: Secondary | ICD-10-CM | POA: Insufficient documentation

## 2020-03-17 LAB — CBC WITH DIFFERENTIAL (CANCER CENTER ONLY)
Abs Immature Granulocytes: 0.03 10*3/uL (ref 0.00–0.07)
Basophils Absolute: 0.1 10*3/uL (ref 0.0–0.1)
Basophils Relative: 1 %
Eosinophils Absolute: 0.2 10*3/uL (ref 0.0–0.5)
Eosinophils Relative: 3 %
HCT: 36.3 % (ref 36.0–46.0)
Hemoglobin: 11.8 g/dL — ABNORMAL LOW (ref 12.0–15.0)
Immature Granulocytes: 0 %
Lymphocytes Relative: 23 %
Lymphs Abs: 2 10*3/uL (ref 0.7–4.0)
MCH: 29.2 pg (ref 26.0–34.0)
MCHC: 32.5 g/dL (ref 30.0–36.0)
MCV: 89.9 fL (ref 80.0–100.0)
Monocytes Absolute: 0.5 10*3/uL (ref 0.1–1.0)
Monocytes Relative: 6 %
Neutro Abs: 5.9 10*3/uL (ref 1.7–7.7)
Neutrophils Relative %: 67 %
Platelet Count: 211 10*3/uL (ref 150–400)
RBC: 4.04 MIL/uL (ref 3.87–5.11)
RDW: 13.3 % (ref 11.5–15.5)
WBC Count: 8.7 10*3/uL (ref 4.0–10.5)
nRBC: 0 % (ref 0.0–0.2)

## 2020-03-17 LAB — CMP (CANCER CENTER ONLY)
ALT: 12 U/L (ref 0–44)
AST: 14 U/L — ABNORMAL LOW (ref 15–41)
Albumin: 4.1 g/dL (ref 3.5–5.0)
Alkaline Phosphatase: 61 U/L (ref 38–126)
Anion gap: 7 (ref 5–15)
BUN: 16 mg/dL (ref 8–23)
CO2: 29 mmol/L (ref 22–32)
Calcium: 9.7 mg/dL (ref 8.9–10.3)
Chloride: 105 mmol/L (ref 98–111)
Creatinine: 0.86 mg/dL (ref 0.44–1.00)
GFR, Estimated: >60 mL/min
Glucose, Bld: 95 mg/dL (ref 70–99)
Potassium: 3.8 mmol/L (ref 3.5–5.1)
Sodium: 141 mmol/L (ref 135–145)
Total Bilirubin: 0.3 mg/dL (ref 0.3–1.2)
Total Protein: 6.9 g/dL (ref 6.5–8.1)

## 2020-03-17 LAB — GENETIC SCREENING ORDER

## 2020-03-17 MED ORDER — LORAZEPAM 0.5 MG PO TABS: ORAL_TABLET | ORAL | 0 refills | Status: DC

## 2020-03-17 NOTE — Progress Notes (Signed)
Radiation Oncology         [phone] ________________________________  Multidisciplinary Breast Oncology Clinic [facility]) Initial Outpatient Consultation  Name: Terri Gay MRN: [id number]  Date: 03/17/2020  DOB: [birth year]  CV:ELFYBOFB, Terri Rumps, NP  Jovita Kussmaul, MD   REFERRING PHYSICIAN: Autumn Messing III, MD  DIAGNOSIS: The encounter diagnosis was Malignant neoplasm of upper-outer quadrant of right breast in female, estrogen receptor positive ([place]).  Stage T1c, Nx, Mx, Right Breast UOQ, Invasive Lobular Carcinoma with LCIS, ER+ / PR+ / Her2-, Grade 2     ICD-10-CM   1. Malignant neoplasm of upper-outer quadrant of right breast in female, estrogen receptor positive ([employer])  C50.411    Z17.0     HISTORY OF PRESENT ILLNESS::Terri Gay is a 69 y.o. female who is presenting to the office today for evaluation of her newly diagnosed breast cancer. She is accompanied by her daughter. She is doing well overall.   She underwent bilateral diagnostic mammography with tomography and bilateral breast ultrasonography at [facility] on 03/01/2020 for evaluation of palpable right breast lump. Results showed a 1.1 x 1.6 x 1.5 cm irregular palpable mass in the right breast at the 12 o'clock position, middle depth, that was highly suggestive of malignancy.  It also showed a 1 cm and 1.1 cm oval simple cysts in the left breast at the 9 o'clock position, posterior depth, that were benign. The two oval cysts in the right breast at the 11 o'clock position were also benign.  Biopsy on 03/09/2020 showed grade 2 invasive mammary carcinoma with mammary carcinoma in situ. Immunohistochemical stain for E-cadherin was negative in the tumor cells, consistent with a lobular phenotype. Prognostic indicators significant for: estrogen receptor, 95% positive and progesterone receptor, 20% positive, both with strong staining intensities. Proliferation marker Ki67 at 10%. HER2 negative.  Menarche: 69 years old Age at first live  birth: 69 years old GP: 2 LMP: Age 4 Contraceptive: Birth control pills approximately 30 years ago HRT: Yes, for approximately six years. Stopped on 03/14/2020   The patient was referred today for presentation in the multidisciplinary conference.  Radiology studies and pathology slides were presented there for review and discussion of treatment options.  A consensus was discussed regarding potential next steps.  PREVIOUS RADIATION THERAPY: No  PAST MEDICAL HISTORY:  Past Medical History:  Diagnosis Date   Allergy    seasonal   Anxiety    Arthritis    Depression    Diverticulosis 10/07/2010   Colonoscopy   GERD (gastroesophageal reflux disease)    Hemorrhoids    History of chicken pox    Hyperlipidemia    Hypertension    Knee pain, left 10/24/2013   Osteopenia 04/25/2013   Osteoporosis    Palpitations 04/26/2013   Preventative health care 04/26/2013    PAST SURGICAL HISTORY: Past Surgical History:  Procedure Laterality Date   NO PAST SURGERIES      FAMILY HISTORY:  Family History  Problem Relation Age of Onset   Lung cancer Father    Lung cancer Paternal Grandfather    Diabetes Mother    Arthritis Mother    Hyperlipidemia Mother    Hypertension Mother    Heart disease Mother        CHF   Diabetes Maternal Grandmother    Heart disease Maternal Grandmother    Arthritis Maternal Grandmother    Hyperlipidemia Maternal Grandmother    Hypertension Maternal Grandmother    Hyperlipidemia Sister    Arthritis Paternal Geophysicist/field seismologist  Heart disease Daughter        arrythmia, Internal Cardiac Defibrillator   Lupus Son    Drug abuse Son    Colon cancer Neg Hx    Esophageal cancer Neg Hx    Rectal cancer Neg Hx    Stomach cancer Neg Hx     SOCIAL HISTORY:  Social History   Socioeconomic History   Marital status: Married    Spouse name: Not on file   Number of children: 2   Years of education: Not on file   Highest  education level: Not on file  Occupational History   Occupation: Customer Service    Employer: [employer]  Tobacco Use   Smoking status: Never Smoker   Smokeless tobacco: Never Used  Substance and Sexual Activity   Alcohol use: Yes    Alcohol/week: 1.0 standard drink    Types: 1 Glasses of wine per week    Comment: occasionally   Drug use: No   Sexual activity: Not on file  Other Topics Concern   Not on file  Social History Narrative   Retired from [employer]    Married for 27 years    Have one son and one daughter ( live in [place])       She likes to dance, read, and play with her dogs.          Caffeine Use: 1 cup coffee and 1 cup tea      Social Determinants of Health   Financial Resource Strain:    Difficulty of Paying Living Expenses: Not on file  Food Insecurity:    Worried About Charity fundraiser in the Last Year: Not on file   [employer] of Food in the Last Year: Not on file  Transportation Needs:    Lack of Transportation (Medical): Not on file   Lack of Transportation (Non-Medical): Not on file  Physical Activity:    Days of Exercise per Week: Not on file   Minutes of Exercise per Session: Not on file  Stress:    Feeling of Stress : Not on file  Social Connections:    Frequency of Communication with Friends and Family: Not on file   Frequency of Social Gatherings with Friends and Family: Not on file   Attends Religious Services: Not on file   Active Member of Clubs or Organizations: Not on file   Attends Archivist Meetings: Not on file   Marital Status: Not on file    ALLERGIES:  Allergies  Allergen Reactions   Amoxicillin Diarrhea    MEDICATIONS:  Current Outpatient Medications  Medication Sig Dispense Refill   atorvastatin (LIPITOR) 20 MG tablet Take 1 tablet (20 mg total) by mouth daily. 90 tablet 3   Bioflavonoid Products (VITAMIN C PLUS) 1000 MG TABS Vitamin C     bisoprolol (ZEBETA) 10 MG  tablet Take 1 tablet by mouth once daily 90 tablet 2   Cetirizine HCl (ZYRTEC ALLERGY) 10 MG CAPS Zyrtec     Cholecalciferol (VITAMIN D-3) 5000 UNITS TABS Take 5,000 Units by mouth daily.      clonazePAM (KLONOPIN) 1 MG tablet Take 1 mg by mouth every 8 (eight) hours as needed. for anxiety     escitalopram (LEXAPRO) 10 MG tablet Take 10 mg by mouth daily.     estradiol (VIVELLE-DOT) 0.025 MG/24HR AS DIRECTED  4   hydrochlorothiazide (MICROZIDE) 12.5 MG capsule Take 1 capsule by mouth once daily 90 capsule 2  hydrocortisone (ANUCORT-HC) 25 MG suppository INSERT 1 SUPPOSITORY RECTALLY AT BEDTIME FOR 10 DAYS (Patient not taking: Reported on 03/17/2020) 24 suppository 0   LORazepam (ATIVAN) 0.5 MG tablet Take 1 tablet 1 hour before procedure, then take 1 tablet right before procedure if needed 2 tablet 0   magnesium gluconate (MAGONATE) 500 MG tablet Take 500 mg by mouth daily.     Omega-3 Fatty Acids (FISH OIL) 1200 MG CAPS Take 1,200 mg by mouth daily.     progesterone (PROMETRIUM) 200 MG capsule Take 200 mg by mouth at bedtime.     temazepam (RESTORIL) 30 MG capsule Take 30 mg by mouth at bedtime as needed.     Zinc 50 MG CAPS zinc     No current facility-administered medications for this encounter.    REVIEW OF SYSTEMS: A 10+ POINT REVIEW OF SYSTEMS WAS OBTAINED including neurology, dermatology, psychiatry, cardiac, respiratory, lymph, extremities, GI, GU, musculoskeletal, constitutional, reproductive, HEENT. On the provided form, she reports loss of sleep, wearing glasses, rhinorrhea secondary to seasonal allergies, rectal bleeding secondary to hemorrhoids, easy bruising, lump in right breast, arthritis, anxiety, and depression. She denies chest pain, shortness of breath, cough, nausea, vomiting, diarrhea, dysuria, breast pain, and any other symptoms.    PHYSICAL EXAM:   Vitals with BMI 03/17/2020  Height 5' 1.5"  Weight 143 lbs  BMI 75.64  Systolic 332  Diastolic 92  Pulse  70   Lungs are clear to auscultation bilaterally. Heart has regular rate and rhythm. No palpable cervical, supraclavicular, or axillary adenopathy. Abdomen soft, non-tender, normal bowel sounds. Left breast with fibrocystic disease throughout. No dominant mass, nipple discharge, or bleeding.  Right breast with bruising from recent biopsy. There is a palpable induration occupying a large portion of the upper and outer breast. No nipple discharge or bleeding.  KPS = 90  100 - Normal; no complaints; no evidence of disease. 90   - Able to carry on normal activity; minor signs or symptoms of disease. 80   - Normal activity with effort; some signs or symptoms of disease. 17   - Cares for self; unable to carry on normal activity or to do active work. 60   - Requires occasional assistance, but is able to care for most of his personal needs. 50   - Requires considerable assistance and frequent medical care. 68   - Disabled; requires special care and assistance. 50   - Severely disabled; hospital admission is indicated although death not imminent. 48   - Very sick; hospital admission necessary; active supportive treatment necessary. 10   - Moribund; fatal processes progressing rapidly. 0     - Dead  Karnofsky DA, Abelmann [place], Craver LS and Burchenal [facility] [phone]) The use of the nitrogen mustards in the palliative treatment of carcinoma: with particular reference to bronchogenic carcinoma Cancer 1 634-56  LABORATORY DATA:  Lab Results  Component Value Date   WBC 8.7 03/17/2020   HGB 11.8 (L) 03/17/2020   HCT 36.3 03/17/2020   MCV 89.9 03/17/2020   PLT 211 03/17/2020   Lab Results  Component Value Date   NA 141 03/17/2020   K 3.8 03/17/2020   CL 105 03/17/2020   CO2 29 03/17/2020   Lab Results  Component Value Date   ALT 12 03/17/2020   AST 14 (L) 03/17/2020   ALKPHOS 61 03/17/2020   BILITOT 0.3 03/17/2020    PULMONARY FUNCTION TEST:   Recent Review Flowsheet Data   There is no  flowsheet  data to display.     RADIOGRAPHY: No results found.    IMPRESSION: Stage T1c, Nx, Mx, Right Breast UOQ, Invasive Lobular Carcinoma with LCIS, ER+ / PR+ / Her2-, Grade 2  The patient may be a  candidate for breast conservation with radiotherapy to the right breast. MRI planned to evaluate the extent of disease. We discussed the general course of radiation, potential side effects, and toxicities with radiation and the patient is interested in this approach.   PLAN:  1. MRI 2. Right lumpectomy with sentinel lymph node biopsy 3. Oncotype Dx 4. Adjuvant radiation therapy 5. Aromatase inhibitor   ------------------------------------------------  Blair Promise, PhD, MD  This document serves as a record of services personally performed by Gery Pray, MD. It was created on his behalf by Clerance Lav, a trained medical scribe. The creation of this record is based on the scribe's personal observations and the provider's statements to them. This document has been checked and approved by the attending provider.

## 2020-03-17 NOTE — Patient Instructions (Signed)

## 2020-03-17 NOTE — Therapy (Signed)
[place], [place], [address] Phone: [phone]   Fax:  [phone]  Physical Therapy Evaluation  Patient Details  Name: Terri Gay MRN: [id number] Date of Birth: [birth year] Referring Provider (PT): Dr. Autumn Messing   Encounter Date: 03/17/2020   PT End of Session - 03/17/20 1524    Visit Number 1    Number of Visits 2    Date for PT Re-Evaluation 05/12/20    PT Start Time 8325    PT Stop Time 1345   Also saw pt from 1418-1445 for a total of 37 minutes   PT Time Calculation (min) 10 min    Activity Tolerance Patient tolerated treatment well    Behavior During Therapy [facility] for tasks assessed/performed           Past Medical History:  Diagnosis Date  . Allergy    seasonal  . Anxiety   . Arthritis   . Depression   . Diverticulosis 10/07/2010   Colonoscopy  . GERD (gastroesophageal reflux disease)   . Hemorrhoids   . History of chicken pox   . Hyperlipidemia   . Hypertension   . Knee pain, left 10/24/2013  . Osteopenia 04/25/2013  . Osteoporosis   . Palpitations 04/26/2013  . Preventative health care 04/26/2013    Past Surgical History:  Procedure Laterality Date  . NO PAST SURGERIES      There were no vitals filed for this visit.    Subjective Assessment - 03/17/20 1507    Subjective Patient report she is here today to be seen by her medical team for her newly diagnosed right breast cancer.    Patient is accompained by: Family member    Pertinent History Patient was diagnosed on 03/01/2020 with right grade II invasive lobular carcinoma breast cancer. It measures 1.6 cm and is located in the upper outer quadrant. It is ER/PR positive and HER2 negative with a Ki67 of 10%.    Patient Stated Goals Reduce lymphedema risk and learn post op shoulder ROM HEP    Currently in Pain? No/denies              [facility] PT Assessment - 03/17/20 0001      Assessment   Medical Diagnosis Right breast  cancer    Referring Provider (PT) Dr. Autumn Messing    Onset Date/Surgical Date 03/01/20    Hand Dominance Right    Prior Therapy none      Precautions   Precautions Other (comment)    Precaution Comments active cancer      Restrictions   Weight Bearing Restrictions No      Balance Screen   Has the patient fallen in the past 6 months No    Has the patient had a decrease in activity level because of a fear of falling?  No    Is the patient reluctant to leave their home because of a fear of falling?  No      Home Environment   Living Environment Private residence    Living Arrangements Spouse/significant other    Available Help at Discharge Family      Prior Function   Level of [place] Retired    Leisure She walks her dogs some and does yardwork      Cognition   Overall Cognitive Status Within Functional Limits for tasks assessed      Posture/Postural Control   Posture/Postural Control Postural limitations  Postural Limitations Rounded Shoulders;Forward head      ROM / Strength   AROM / PROM / Strength AROM;Strength      AROM   Overall AROM Comments Cervical AROM is WNL    AROM Assessment Site Shoulder    Right/Left Shoulder Left;Right    Right Shoulder Extension 52 Degrees    Right Shoulder Flexion 147 Degrees    Right Shoulder ABduction 167 Degrees    Right Shoulder Internal Rotation 64 Degrees    Right Shoulder External Rotation 83 Degrees    Left Shoulder Extension 44 Degrees    Left Shoulder Flexion 142 Degrees    Left Shoulder ABduction 169 Degrees    Left Shoulder Internal Rotation 64 Degrees    Left Shoulder External Rotation 82 Degrees      Strength   Overall Strength Within functional limits for tasks performed             LYMPHEDEMA/ONCOLOGY QUESTIONNAIRE - 03/17/20 0001      Type   Cancer Type Right breast cancer      Lymphedema Assessments   Lymphedema Assessments Upper extremities      Right Upper Extremity  Lymphedema   10 cm Proximal to Olecranon Process 26.2 cm    Olecranon Process 22.8 cm    10 cm Proximal to Ulnar Styloid Process 20.2 cm    Just Proximal to Ulnar Styloid Process 14.5 cm    Across Hand at [employer] 17.3 cm    At [place] of 2nd Digit 5.8 cm      Left Upper Extremity Lymphedema   10 cm Proximal to Olecranon Process 26.4 cm    Olecranon Process 23.1 cm    10 cm Proximal to Ulnar Styloid Process 20.2 cm    Just Proximal to Ulnar Styloid Process 14.4 cm    Across Hand at [employer] 17.7 cm    At [place] of 2nd Digit 5.9 cm           L-DEX FLOWSHEETS - 03/17/20 1500      L-DEX LYMPHEDEMA SCREENING   Measurement Type Unilateral    L-DEX MEASUREMENT EXTREMITY Upper Extremity    POSITION  Standing    DOMINANT SIDE Right    At Risk Side Right    BASELINE SCORE (UNILATERAL) -6.1           The patient was assessed using the L-Dex machine today to produce a lymphedema index baseline score. The patient will be reassessed on a regular basis (typically every 3 months) to obtain new L-Dex scores. If the score is > 6.5 points away from his/her baseline score indicating onset of subclinical lymphedema, it will be recommended to wear a compression garment for 4 weeks, 12 hours per day and then be reassessed. If the score continues to be > 6.5 points from baseline at reassessment, we will initiate lymphedema treatment. Assessing in this manner has a 95% rate of preventing clinically significant lymphedema.      Katina Dung - 03/17/20 0001    Open a tight or new jar Mild difficulty    Do heavy household chores (wash walls, wash floors) No difficulty    Carry a shopping bag or briefcase No difficulty    Wash your back No difficulty    Use a knife to cut food No difficulty    Recreational activities in which you take some force or impact through your arm, shoulder, or hand (golf, hammering, tennis) No difficulty    During the  past week, to what extent has your arm, shoulder  or hand problem interfered with your normal social activities with family, friends, neighbors, or groups? Not at all    During the past week, to what extent has your arm, shoulder or hand problem limited your work or other regular daily activities Not at all    Arm, shoulder, or hand pain. None    Tingling (pins and needles) in your arm, shoulder, or hand None    Difficulty Sleeping No difficulty    DASH Score 2.27 %            Objective measurements completed on examination: See above findings.         Patient was instructed today in a home exercise program today for post op shoulder range of motion. These included active assist shoulder flexion in sitting, scapular retraction, wall walking with shoulder abduction, and hands behind head external rotation.  She was encouraged to do these twice a day, holding 3 seconds and repeating 5 times when permitted by her physician.          PT Education - 03/17/20 1518    Education Details Lymphedema risk reduction and post op shoulder ROM HEP    Person(s) Educated Patient    Methods Explanation;Demonstration;Handout    Comprehension Returned demonstration;Verbalized understanding               PT Long Term Goals - 03/17/20 1521      PT LONG TERM GOAL #1   Title Patient will demonstrate she has regained full shoulder ROM and function post operatively compared ot baselines.    Time 8    Period Weeks    Status New    Target Date 05/12/20           Breast Clinic Goals - 03/17/20 1521      Patient will be able to verbalize understanding of pertinent lymphedema risk reduction practices relevant to her diagnosis specifically related to skin care.   Time 1    Period Days    Status Achieved      Patient will be able to return demonstrate and/or verbalize understanding of the post-op home exercise program related to regaining shoulder range of motion.   Time 1    Period Days    Status Achieved      Patient will be able  to verbalize understanding of the importance of attending the postoperative After Breast Cancer Class for further lymphedema risk reduction education and therapeutic exercise.   Time 1    Period Days    Status Achieved                 Plan - 03/17/20 1519    Clinical Impression Statement Patient was diagnosed on 03/01/2020 with right grade II invasive lobular carcinoma breast cancer. It measures 1.6 cm and is located in the upper outer quadrant. It is ER/PR positive and HER2 negative with a Ki67 of 10%. Her multidisciplinary medical team met prior to her assessments to determine a recommended treatment plan. She is planning to have a right lumpectomy and sentinel node biopsy followed by Oncotype testing, radiation, and anti-estrogen therapy. She will benefit from a post op PT reassessment to determine needs and from L-Dex screens every 3 months to detect subclinical lymphedema.    Stability/Clinical Decision Making Stable/Uncomplicated    Clinical Decision Making Low    Rehab Potential Excellent    PT Frequency --   Eval and 1 f/u visit  PT Treatment/Interventions ADLs/Self Care Home Management;Therapeutic exercise;Patient/family education    PT Next Visit Plan Will reassess 3-4 weeks post op    PT Home Exercise Plan Post op shoulder ROM HEP    Consulted and Agree with Plan of Care Patient;Family member/caregiver    Family Member Consulted daughter           Patient will benefit from skilled therapeutic intervention in order to improve the following deficits and impairments:  Postural dysfunction, Decreased range of motion, Decreased knowledge of precautions, Impaired UE functional use, Pain  Visit Diagnosis: Malignant neoplasm of upper-outer quadrant of right breast in female, estrogen receptor positive ([place]) - Plan: PT plan of care cert/re-cert  Abnormal posture - Plan: PT plan of care cert/re-cert   Patient will follow up at outpatient cancer rehab 3-4 weeks following  surgery.  If the patient requires physical therapy at that time, a specific plan will be dictated and sent to the referring physician for approval. The patient was educated today on appropriate basic range of motion exercises to begin post operatively and the importance of attending the After Breast Cancer class following surgery.  Patient was educated today on lymphedema risk reduction practices as it pertains to recommendations that will benefit the patient immediately following surgery.  She verbalized good understanding.      Problem List Patient Active Problem List   Diagnosis Date Noted  . Malignant neoplasm of upper-outer quadrant of right breast in female, estrogen receptor positive ([place]) 03/11/2020  . Knee pain, left 10/24/2013  . Palpitations 04/26/2013  . Preventative health care 04/26/2013  . Osteopenia 04/25/2013  . Acute esophagitis 09/19/2012  . GERD (gastroesophageal reflux disease) 03/03/2012  . Essential hypertension 08/23/2011  . Rectal bleeding 06/23/2011  . Hyperlipidemia 03/02/2011  . Depression with anxiety 01/26/2011  . Insomnia 01/26/2011  . Diverticulosis 01/17/2011  . Hemorrhoids 01/17/2011  . Osteoarthritis 01/17/2011   Annia Friendly, PT 03/17/20 3:24 PM  [place], [place], [address] Phone: [phone]   Fax:  [phone]  Name: AARIYAH SAMPEY MRN: [id number] Date of Birth: May 18, 1951

## 2020-03-18 NOTE — Progress Notes (Unsigned)
Met with Zada in [facility] to introduce [place] team/resources, reviewing distress screen per protocol.  The patient scored a 5 on the Psychosocial Distress Thermometer which indicates Moderate distress. Also assessed for distress and other psychosocial needs.    Mistie shared that she has an MRI on Saturday and she'll find out on Monday whether her cancer is more advanced than it looks. She acknowledged that she feels overwhelmed by all of the information and possibilities, but is doing her best to just focus on one thing at a time.  She is presently focused on staying calm until she gets the results of her MRI and is finding comfort in prayer and reading Psalms.  Joann reports her husband and daughter are also great supports and she has a good friend who she is able to call at anytime.   She does not think she wants to take on any scheduled programming or a peer mentor at this time, but is open to continued conversation with support providers.     Follow up needed: yes        [address], [place], [facility]  Pager [phone]  Voicemail [phone]

## 2020-03-20 ENCOUNTER — Other Ambulatory Visit: Payer: Self-pay

## 2020-03-20 ENCOUNTER — Ambulatory Visit
Admission: RE | Admit: 2020-03-20 | Discharge: 2020-03-20 | Disposition: A | Discharge status: Home / Self Care | Payer: Medicare Other | Source: Ambulatory Visit | Attending: General Surgery | Admitting: General Surgery

## 2020-03-20 DIAGNOSIS — D0501 Lobular carcinoma in situ of right breast: Secondary | ICD-10-CM | POA: Diagnosis not present

## 2020-03-20 DIAGNOSIS — Z170 Estrogen receptor positive status [ER+]: Secondary | ICD-10-CM

## 2020-03-20 DIAGNOSIS — C50411 Malignant neoplasm of upper-outer quadrant of right female breast: Secondary | ICD-10-CM

## 2020-03-20 IMAGING — MR MR BREAST BILAT WO/W CM
8 of 13 series · 32 of 48 positions shown · IV contrast (gadavist)
Comparison: Previous exams [DATE] and earlier from [facility].

CLINICAL DATA: 69-year-old with recent biopsy-proven invasive
lobular carcinoma involving the UPPER RIGHT breast and benign cysts
in both breasts on recent ultrasound. Prior imaging was performed at
[facility].

LABS:  Not applicable.
EXAM:
BILATERAL BREAST MRI WITH AND WITHOUT CONTRAST
TECHNIQUE: Multiplanar, multisequence MR images of both breasts were obtained
prior to and following the intravenous administration of 6 ml of
Gadavist.

[Series 2: t2_tirm_tra ipat (a-p) · axial · 3.0mm · 0.70mm/px · 1 of 55 slices shown]
[im 1/55]
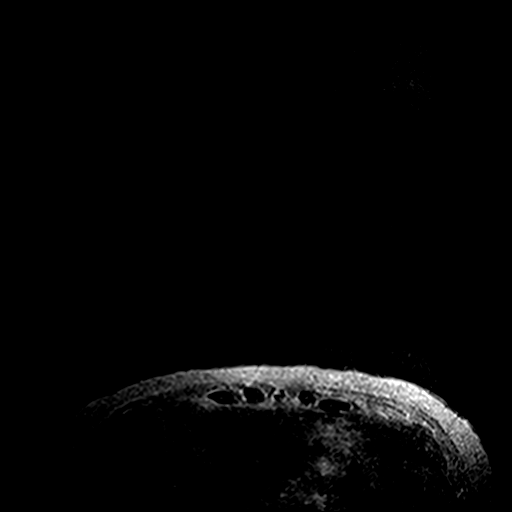

[Series 3: fl3d pre-cm no · axial · non-contrast · 1.2mm · 0.94mm/px · z∈[-73,+99]mm · 5 of 144 slices shown]
[im 1/144]
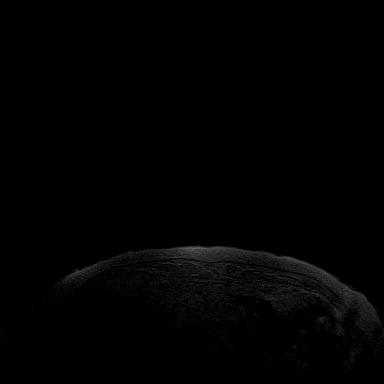
[im 36/144]
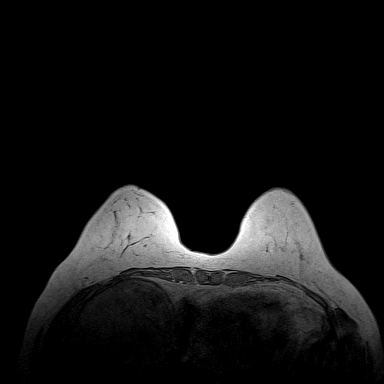
[im 72/144]
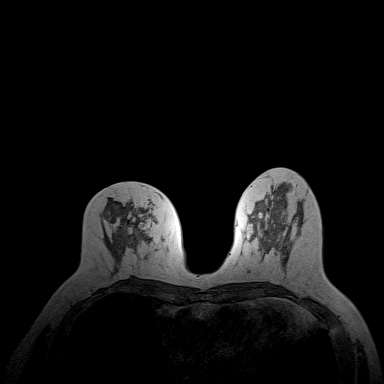
[im 108/144]
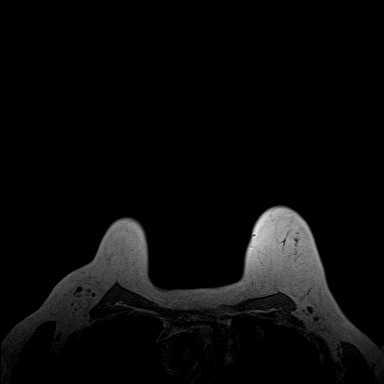
[im 144/144]
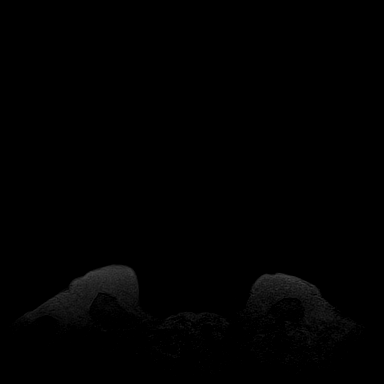

[Series 4: fl3d pre-cm · axial · non-contrast · 1.2mm · 0.94mm/px · z∈[-73,+99]mm · 5 of 144 slices shown]
[im 1/144]
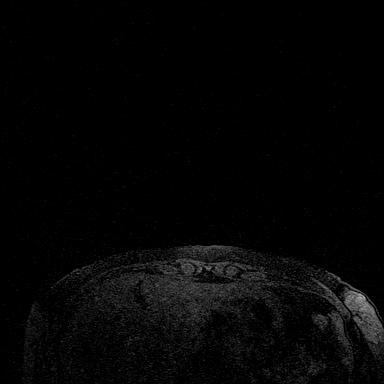
[im 36/144]
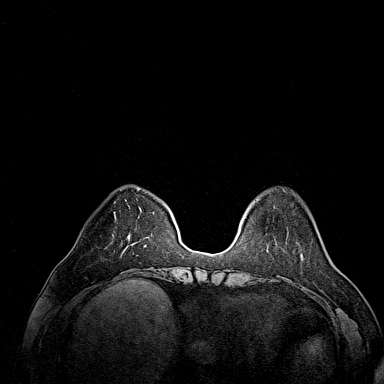
[im 72/144]
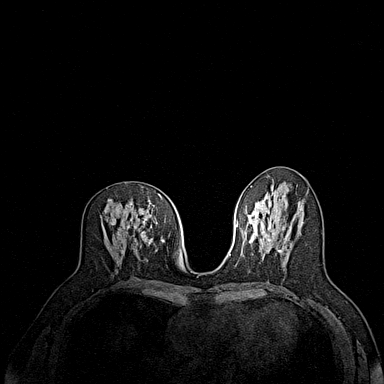
[im 108/144]
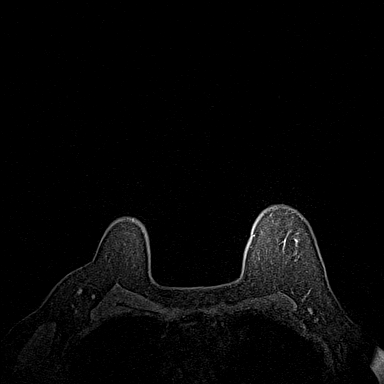
[im 144/144]
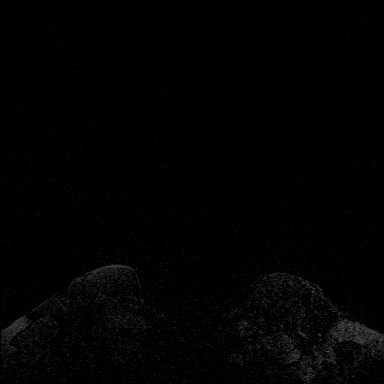

[Series 5: fl3d post-cm 20 · axial · 1.2mm · 0.94mm/px · z∈[-73,+99]mm · 5 of 144 slices shown (1 of 3)]
[im 1/144]
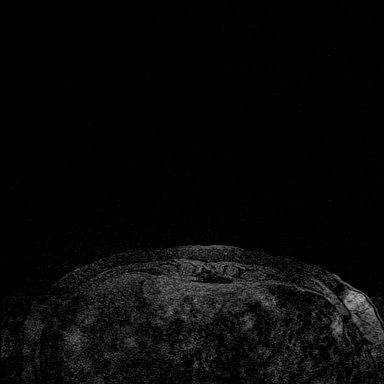
[im 36/144]
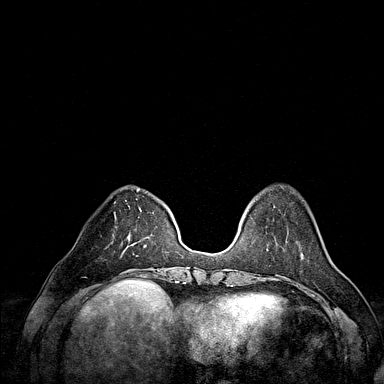
[im 72/144]
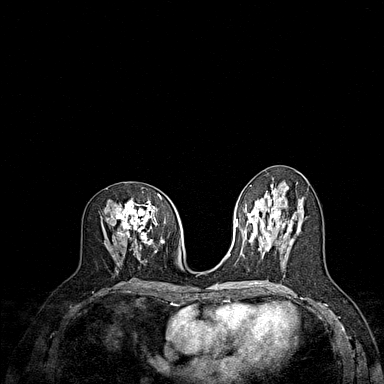
[im 108/144]
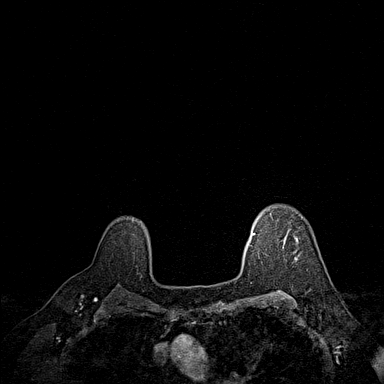
[im 144/144]
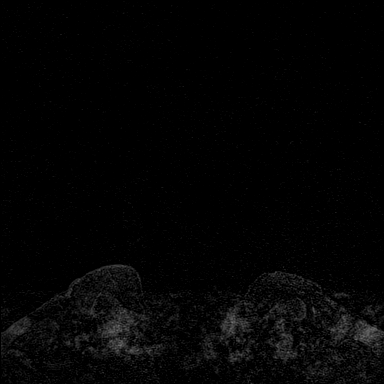

[Series 6: fl3d post-cm 20 · axial · 1.2mm · 0.94mm/px · z∈[-73,+99]mm · 5 of 144 slices shown (2 of 3)]
[im 1/144]
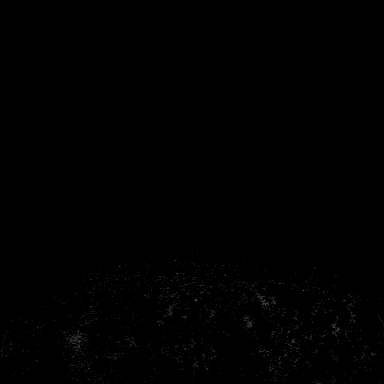
[im 36/144]
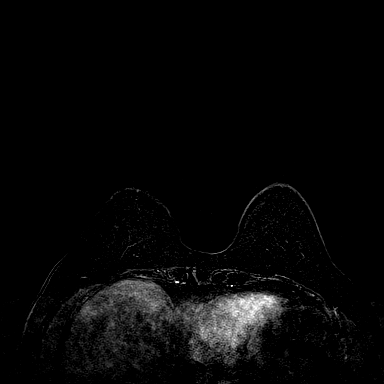
[im 72/144]
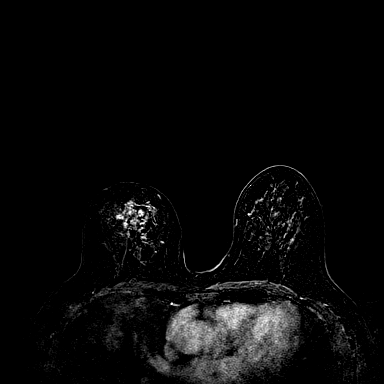
[im 108/144]
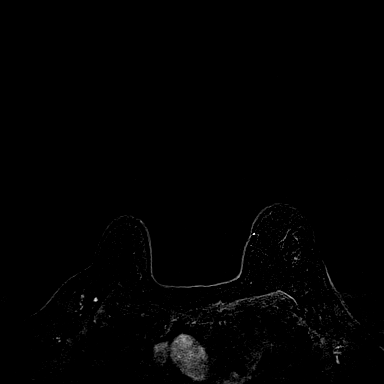
[im 144/144]
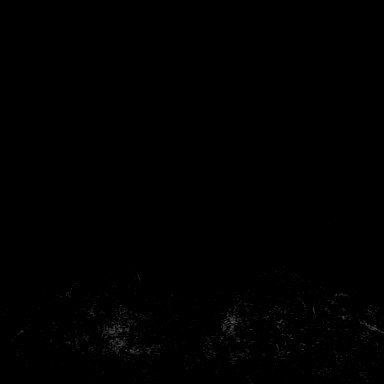

[Series 7: fl3d post-cm 20 · axial · 172.8mm · 0.94mm/px · 1 of 1 slices shown (3 of 3)]
[im 1/1]
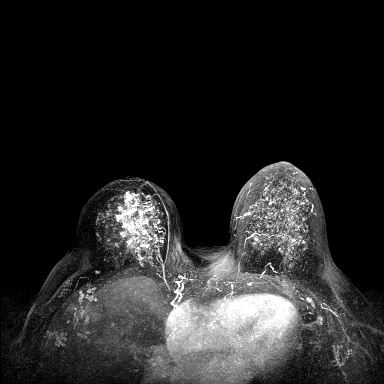

[Series 8: fl3d post-cm 3min · axial · 1.2mm · 0.94mm/px · z∈[-73,+99]mm · 5 of 144 slices shown]
[im 1/144]
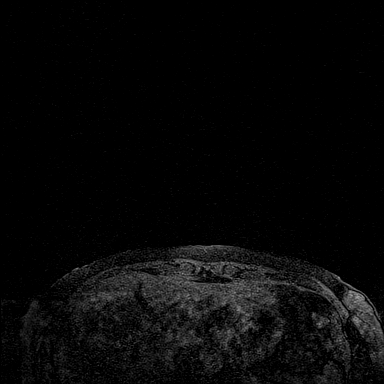
[im 36/144]
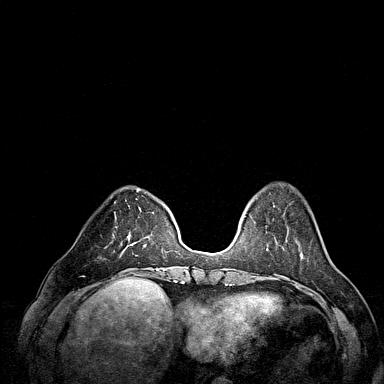
[im 72/144]
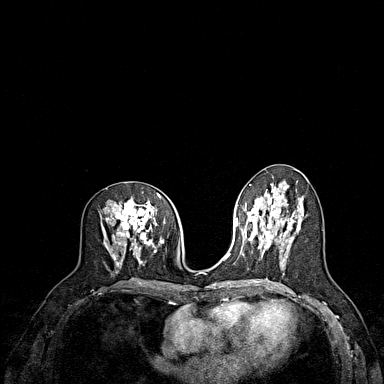
[im 108/144]
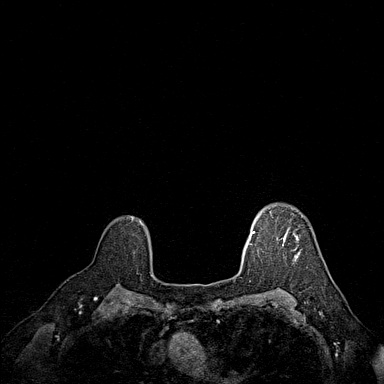
[im 144/144]
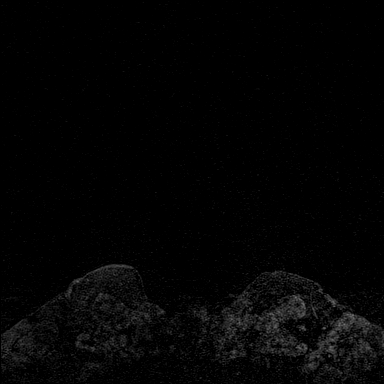

[Series 9: fl3d post-cm 3min_sub · axial · 1.2mm · 0.94mm/px · z∈[-73,+64]mm · 5 of 144 slices shown]
[im 1/144]
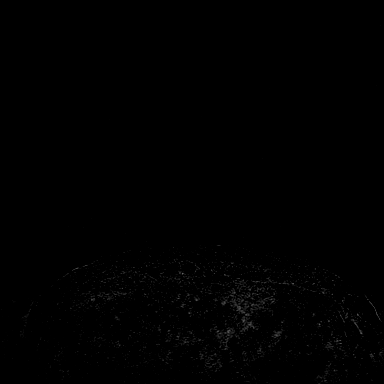
[im 29/144]
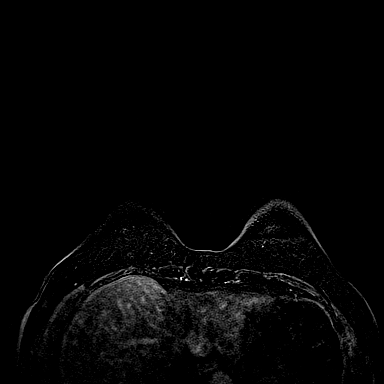
[im 58/144]
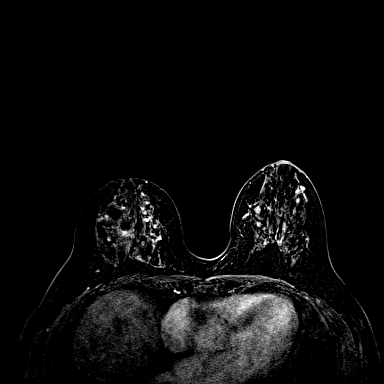
[im 86/144]
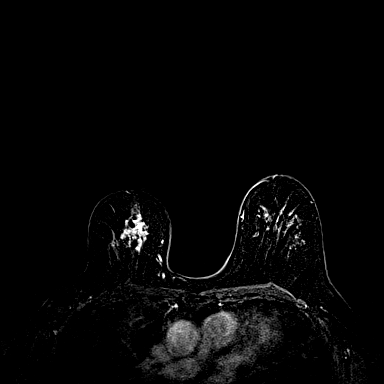
[im 115/144]
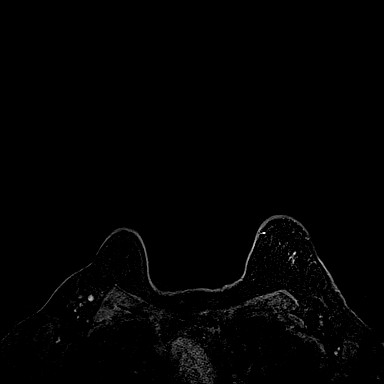

[32 of 48 positions shown; findings below may reference images not displayed]

Three-dimensional MR images were rendered by post-processing of the
original MR data on an independent workstation. The
three-dimensional MR images were interpreted, and findings are
reported in the following complete MRI report for this study. Three
dimensional images were evaluated at the independent interpreting
workstation using the DynaCAD thin client.
FINDINGS: Breast composition: d. Extreme fibroglandular tissue.

Background parenchymal enhancement: Moderate to marked.

Right breast: Regional non-mass enhancement involving the UPPER
INNER QUADRANT extending from anterior to posterior depth, spanning
approximately 6.3 (AP) x 4.3 (transverse) x 4.9 (craniocaudal) cm,
demonstrating plateau and washout kinetics. Blooming artifact from
the tissue marker clip placed at the time of the biopsy is present
at the superior aspect of this non-mass enhancement.

Multiple benign cysts throughout the left breast.

Left breast: No suspicious mass or abnormal enhancement. Multiple
benign cysts throughout the breast, the largest measuring
approximately 2.3 cm in the outer breast.

Lymph nodes: No pathologic lymphadenopathy.

Ancillary findings:  None.
IMPRESSION: 1. Regional non-mass enhancement involving the UPPER INNER QUADRANT
of the RIGHT breast spanning approximately 6.3 cm. The tissue marker
clip from the biopsy-proven invasive lobular carcinoma is at the
superior aspect of this non-mass enhancement.
2. No MRI evidence of malignancy involving the LEFT breast.
3. Multiple benign BILATERAL breast cysts.
4. No pathologic lymphadenopathy.

RECOMMENDATION:
MRI guided biopsy of the anterior extent and the posterior extent of
the non-mass enhancement in the RIGHT breast to confirm extent of
disease.

BI-RADS CATEGORY  4: Suspicious.

## 2020-03-20 MED ORDER — GADOBUTROL 1 MMOL/ML IV SOLN
6.0000 mL | Freq: Once | INTRAVENOUS | Status: AC | PRN
  Administered 2020-03-20: 6 mL via INTRAVENOUS

## 2020-03-23 ENCOUNTER — Other Ambulatory Visit: Payer: Self-pay | Admitting: General Surgery

## 2020-03-23 DIAGNOSIS — R9389 Abnormal findings on diagnostic imaging of other specified body structures: Secondary | ICD-10-CM

## 2020-03-25 ENCOUNTER — Telehealth: Payer: Self-pay | Admitting: *Deleted

## 2020-03-25 ENCOUNTER — Encounter: Payer: Self-pay | Admitting: *Deleted

## 2020-03-25 NOTE — Telephone Encounter (Signed)
Spoke with patient to follow up from [facility] last week and assess navigation needs. Patient was disappointed to hear she needed an additional bx to evaluate extent of disease. She may need reconstruction and so referral has been placed for Dr. Marla Roe. Gave support.  Encouraged her to call with any questions or concerns.

## 2020-04-01 ENCOUNTER — Ambulatory Visit
Admission: RE | Admit: 2020-04-01 | Discharge: 2020-04-01 | Disposition: A | Discharge status: Home / Self Care | Payer: Medicare Other | Source: Ambulatory Visit | Attending: General Surgery | Admitting: General Surgery

## 2020-04-01 ENCOUNTER — Ambulatory Visit
Admission: RE | Admit: 2020-04-01 | Discharge: 2020-04-01 | Disposition: A | Discharge status: Home / Self Care | Payer: BC Managed Care – PPO | Source: Ambulatory Visit | Attending: General Surgery | Admitting: General Surgery

## 2020-04-01 ENCOUNTER — Other Ambulatory Visit: Payer: Self-pay

## 2020-04-01 ENCOUNTER — Other Ambulatory Visit: Payer: Self-pay | Admitting: General Practice

## 2020-04-01 DIAGNOSIS — R928 Other abnormal and inconclusive findings on diagnostic imaging of breast: Secondary | ICD-10-CM | POA: Diagnosis not present

## 2020-04-01 DIAGNOSIS — R9389 Abnormal findings on diagnostic imaging of other specified body structures: Secondary | ICD-10-CM

## 2020-04-01 DIAGNOSIS — C50111 Malignant neoplasm of central portion of right female breast: Secondary | ICD-10-CM | POA: Diagnosis not present

## 2020-04-01 DIAGNOSIS — C50811 Malignant neoplasm of overlapping sites of right female breast: Secondary | ICD-10-CM | POA: Diagnosis not present

## 2020-04-01 IMAGING — MR MR BREAST BX W/ LOC DEV 1ST LEASION IMAGE BX SPEC MR GUIDE*R*
6 of 8 series · 30 of 48 positions shown · IV contrast (gadavist)
Comparison: Previous exams.
COMPARISON: Previous exams.

Addendum:
CLINICAL DATA: 69-year-old female with recently diagnosed invasive
lobular carcinoma in the upper right breast. Patient presents for
additional MRI guided biopsies of abnormal non mass enhancement in
the right breast.

EXAM:
MRI GUIDED CORE NEEDLE BIOPSY OF THE RIGHT BREAST x 2
TECHNIQUE: Multiplanar, multisequence MR imaging of the right breast was
performed both before and after administration of intravenous
contrast.
CONTRAST:  6mL GADAVIST GADOBUTROL 1 MMOL/ML IV SOLN

[Series 2: fiducial unilateral · sagittal · 2.0mm · 1.33mm/px · 1 of 52 slices shown]
[im 1/52]
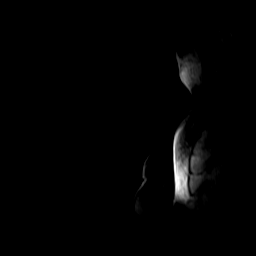

[Series 3: dynamic pre · axial · non-contrast · 1.3mm · 0.73mm/px · z∈[-103,+104]mm · 6 of 160 slices shown]
[im 1/160]
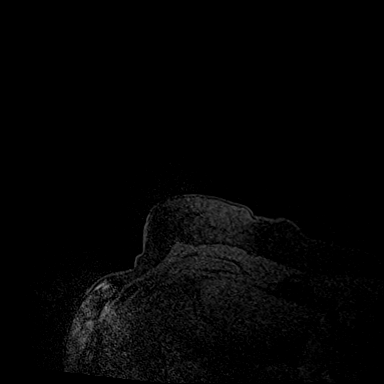
[im 32/160]
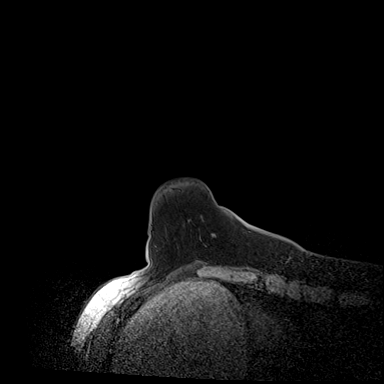
[im 64/160]
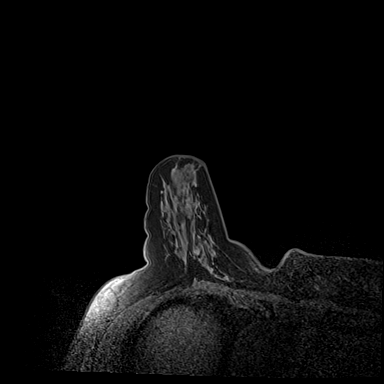
[im 96/160]
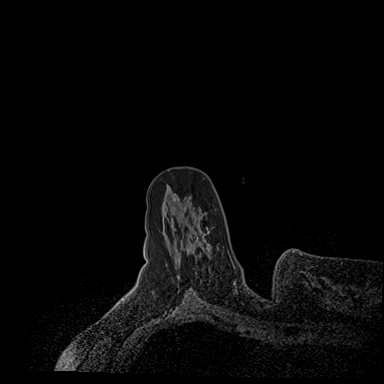
[im 128/160]
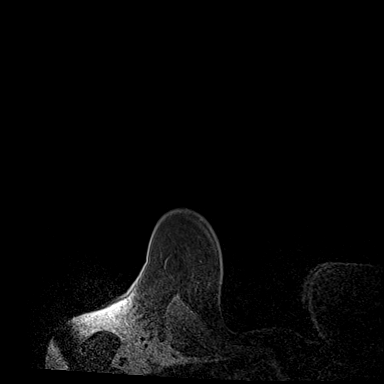
[im 160/160]
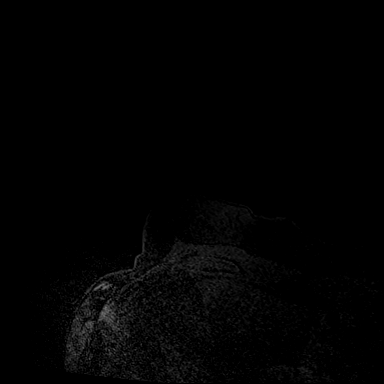

[Series 4: dynamic post 20 · axial · 1.3mm · 0.73mm/px · z∈[-103,+104]mm · 6 of 160 slices shown (1 of 2)]
[im 1/160]
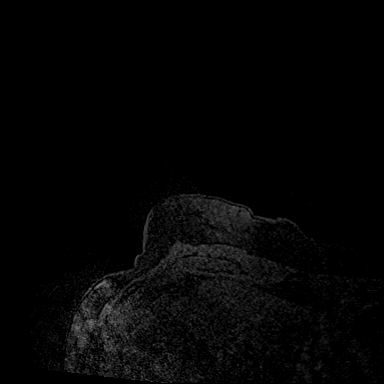
[im 32/160]
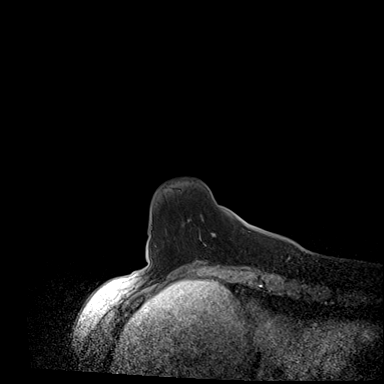
[im 64/160]
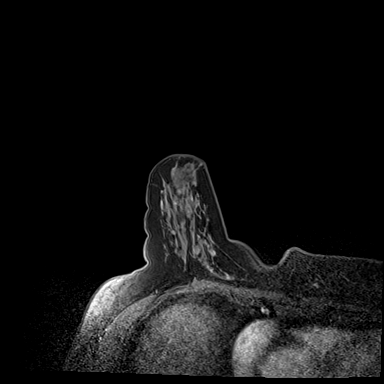
[im 96/160]
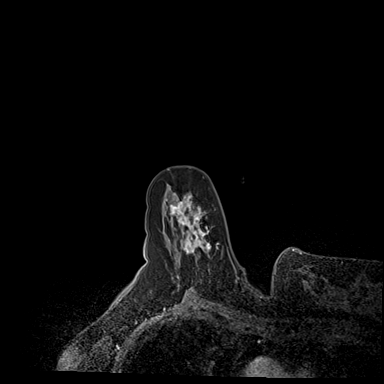
[im 128/160]
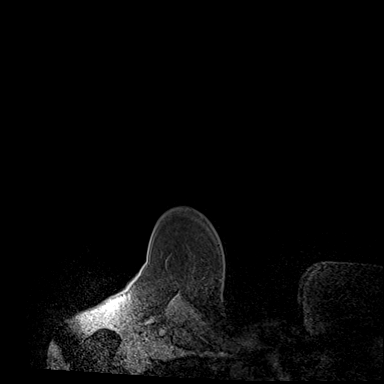
[im 160/160]
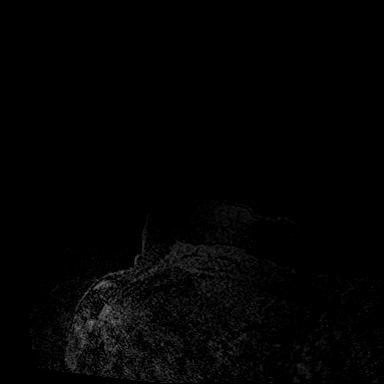

[Series 5: dynamic post 20 · axial · 1.3mm · 0.73mm/px · z∈[-103,+104]mm · 7 of 160 slices shown (2 of 2)]
[im 1/160]
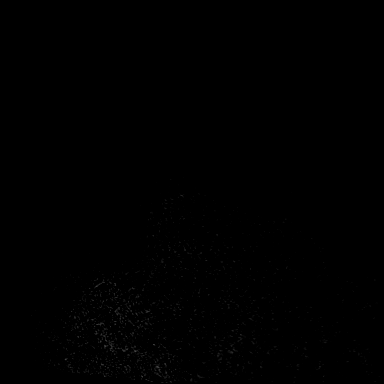
[im 27/160]
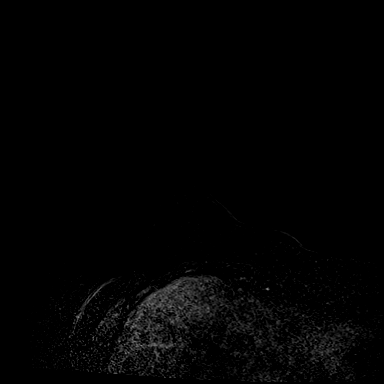
[im 54/160]
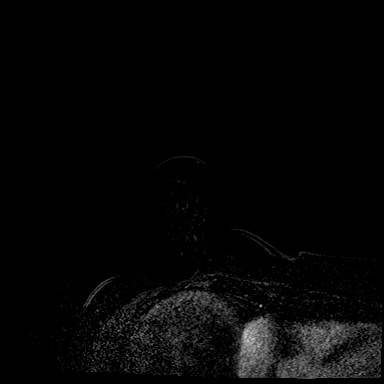
[im 80/160]
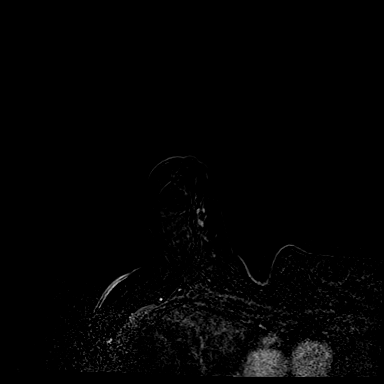
[im 107/160]
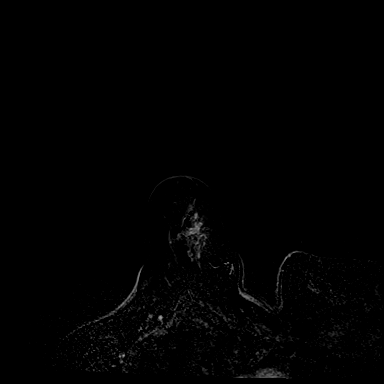
[im 133/160]
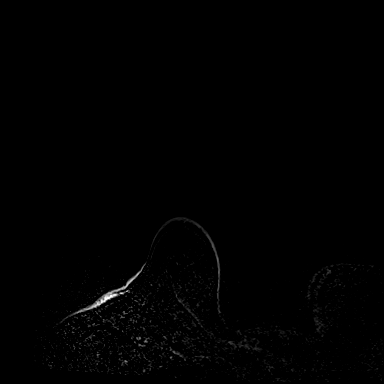
[im 160/160]
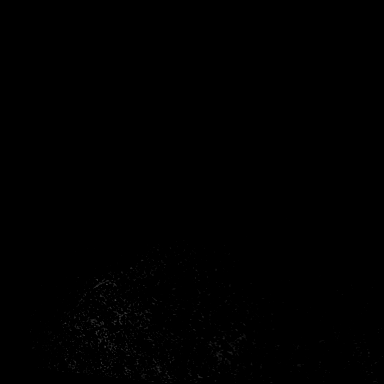

[Series 6: dynamic post 3 · axial · 1.3mm · 0.73mm/px · z∈[-103,+104]mm · 7 of 160 slices shown (1 of 2)]
[im 1/160]
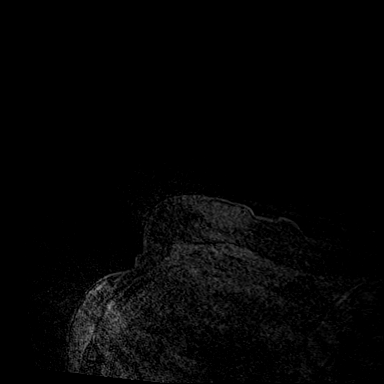
[im 27/160]
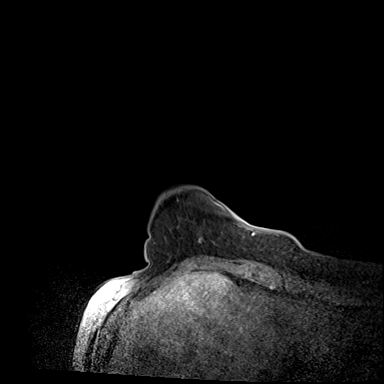
[im 54/160]
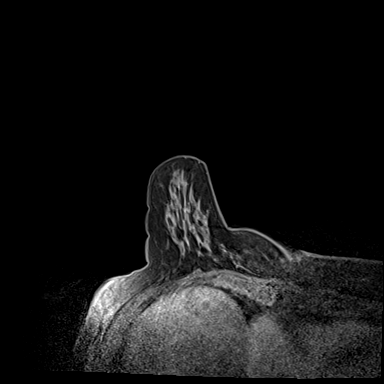
[im 80/160]
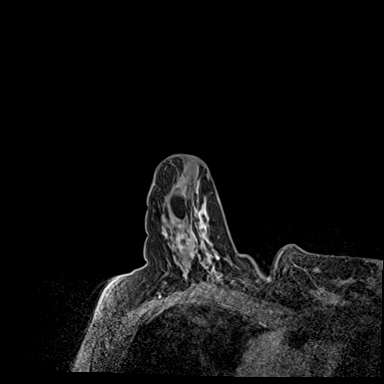
[im 107/160]
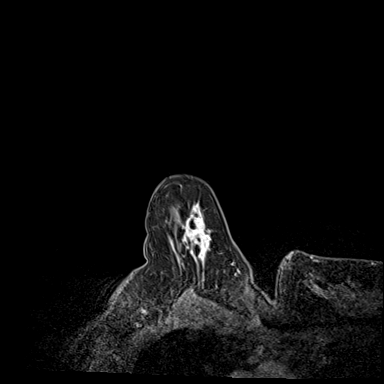
[im 133/160]
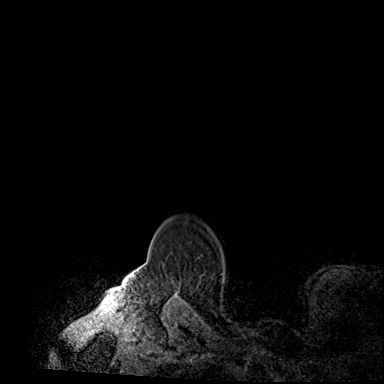
[im 160/160]
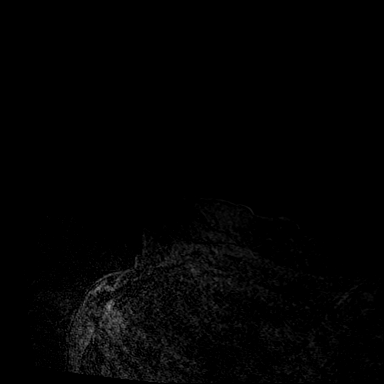

[Series 7: dynamic post 3 · axial · 1.3mm · 0.73mm/px · z∈[-103,-34]mm · 3 of 160 slices shown (2 of 2)]
[im 1/160]
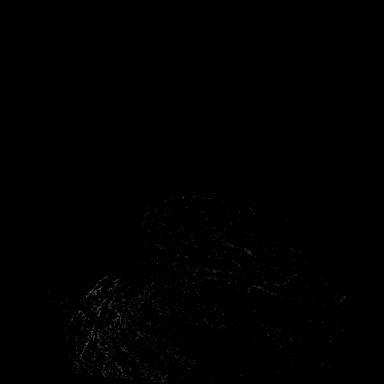
[im 27/160]
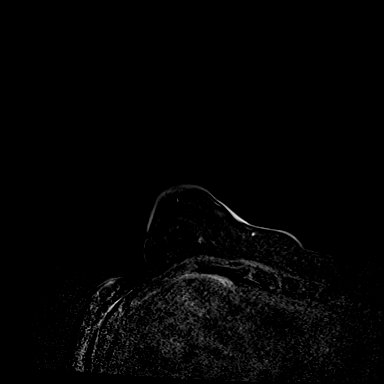
[im 54/160]
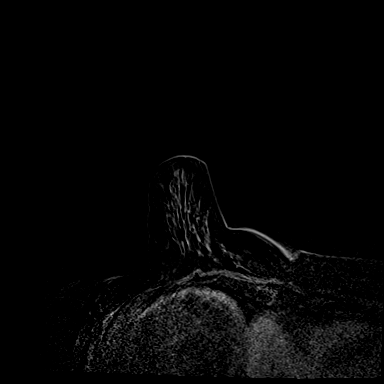

[30 of 48 positions shown; findings below may reference images not displayed]

FINDINGS: I met with the patient, and we discussed the procedure of MRI guided
biopsy, including risks, benefits, and alternatives. Specifically,
we discussed the risks of infection, bleeding, tissue injury, clip
migration, and inadequate sampling. Informed, written consent was
given. The usual time out protocol was performed immediately prior
to the procedure.

1.  Right breast anterior/retroareolar

Using sterile technique, 1% Lidocaine, MRI guidance, and a 9 gauge
vacuum assisted device, biopsy was performed of non mass enhancement
in the anterior/retroareolar right breast using a lateral approach.
At the conclusion of the procedure, a a cylinder tissue marker clip
was deployed into the biopsy cavity. Follow-up 2-view mammogram was
performed and dictated separately.

2.  Right breast 12 o'clock posterior

Using sterile technique, 1% Lidocaine, MRI guidance, and a 9 gauge
vacuum assisted device, biopsy was performed of non mass enhancement
at 12 o'clock posterior using a lateral approach. At the conclusion
of the procedure, a a barbell tissue marker clip was deployed into
the biopsy cavity. Follow-up 2-view mammogram was performed and
dictated separately.
IMPRESSION: MRI guided biopsy of non mass enhancement in the
anterior/retroareolar right breast and of non mass enhancement at 12
o'clock posterior in the right breast. No apparent complications.

ADDENDUM:
Pathology revealed INVASIVE LOBULAR CARCINOMA, LOBULAR NEOPLASIA
(ATYPICAL LOBULAR HYPERPLASIA) of the RIGHT breast, anterior
retroareolar. This was found to be concordant by Dr. WILMER
WILMER.

Pathology revealed INVASIVE LOBULAR CARCINOMA of the RIGHT breast,
12 o'clock, posterior. This was found to be concordant by Dr. WILMER
WILMER.

Pathology results were discussed with the patient and daughter
(WILMER) by telephone. Dr. WILMER with patient on
[DATE]. Dr. WILMER attempted to reach patent on [DATE].
The patient reported doing well after the biopsies with tenderness
at the sites. Post biopsy instructions and care were reviewed and
questions were answered. The patient was encouraged to call The

The patient has a recent diagnosis of right breast cancer and should
follow her outlined treatment plan.

Pathology results reported by WILMER RN on [DATE].

*** End of Addendum ***
FINDINGS: I met with the patient, and we discussed the procedure of MRI guided
biopsy, including risks, benefits, and alternatives. Specifically,
we discussed the risks of infection, bleeding, tissue injury, clip
migration, and inadequate sampling. Informed, written consent was
given. The usual time out protocol was performed immediately prior
to the procedure.

1.  Right breast anterior/retroareolar

Using sterile technique, 1% Lidocaine, MRI guidance, and a 9 gauge
vacuum assisted device, biopsy was performed of non mass enhancement
in the anterior/retroareolar right breast using a lateral approach.
At the conclusion of the procedure, a a cylinder tissue marker clip
was deployed into the biopsy cavity. Follow-up 2-view mammogram was
performed and dictated separately.

2.  Right breast 12 o'clock posterior

Using sterile technique, 1% Lidocaine, MRI guidance, and a 9 gauge
vacuum assisted device, biopsy was performed of non mass enhancement
at 12 o'clock posterior using a lateral approach. At the conclusion
of the procedure, a a barbell tissue marker clip was deployed into
the biopsy cavity. Follow-up 2-view mammogram was performed and
dictated separately.
IMPRESSION: MRI guided biopsy of non mass enhancement in the
anterior/retroareolar right breast and of non mass enhancement at 12
o'clock posterior in the right breast. No apparent complications.

## 2020-04-01 IMAGING — MG MM BREAST LOCALIZATION CLIP
4 series · 4 of 12 positions shown · non-contrast
Comparison: Previous exam(s).

CLINICAL DATA: Postprocedure mammogram for clip placement.

EXAM:
DIAGNOSTIC RIGHT MAMMOGRAM POST MRI BIOPSY

[R CC synth-2D]
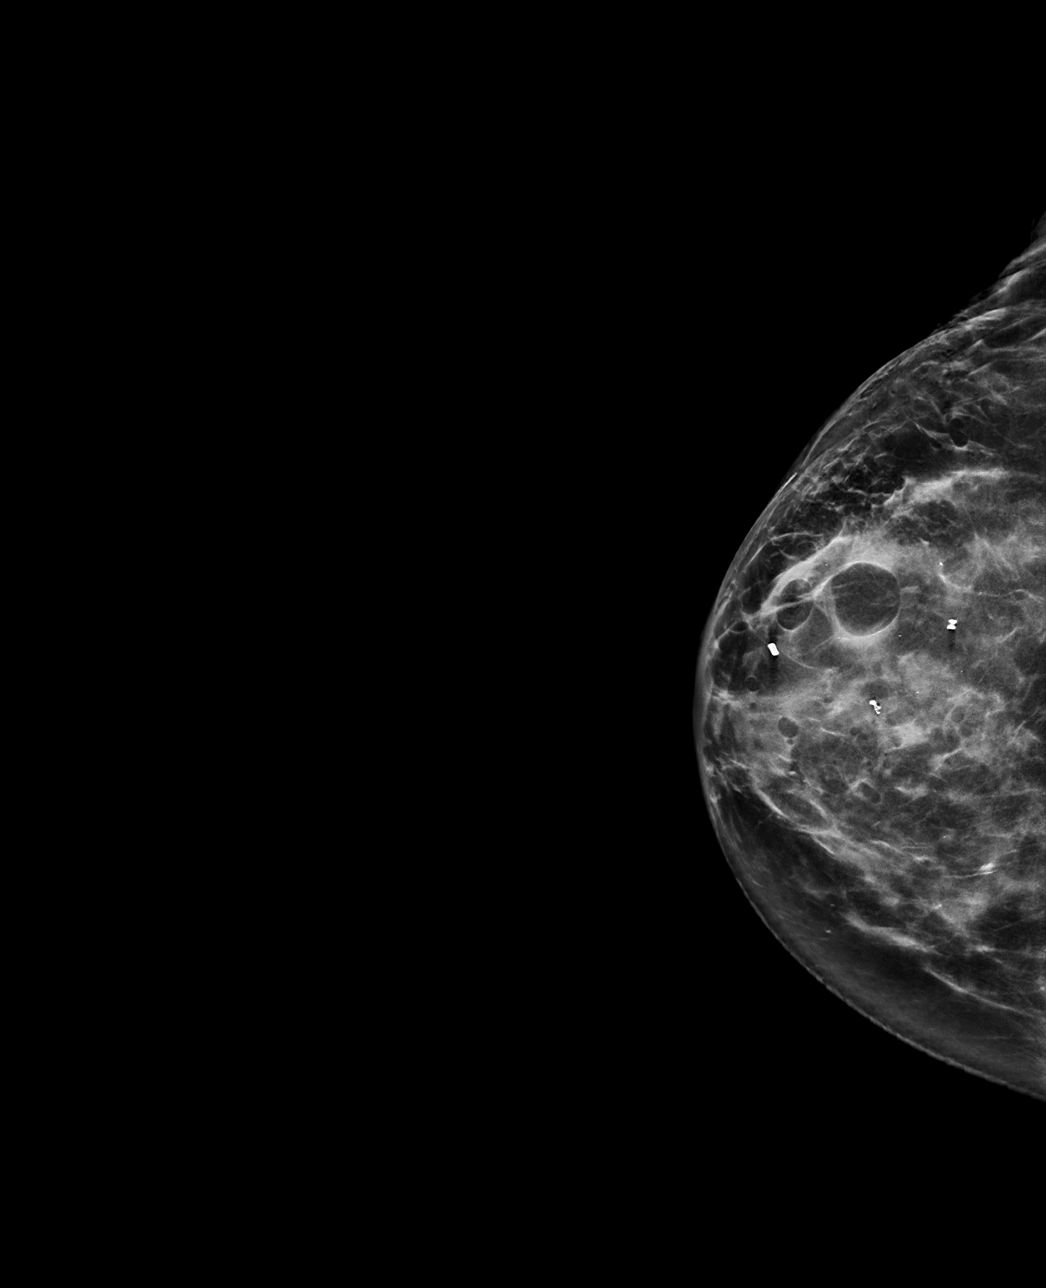

[R ML synth-2D]
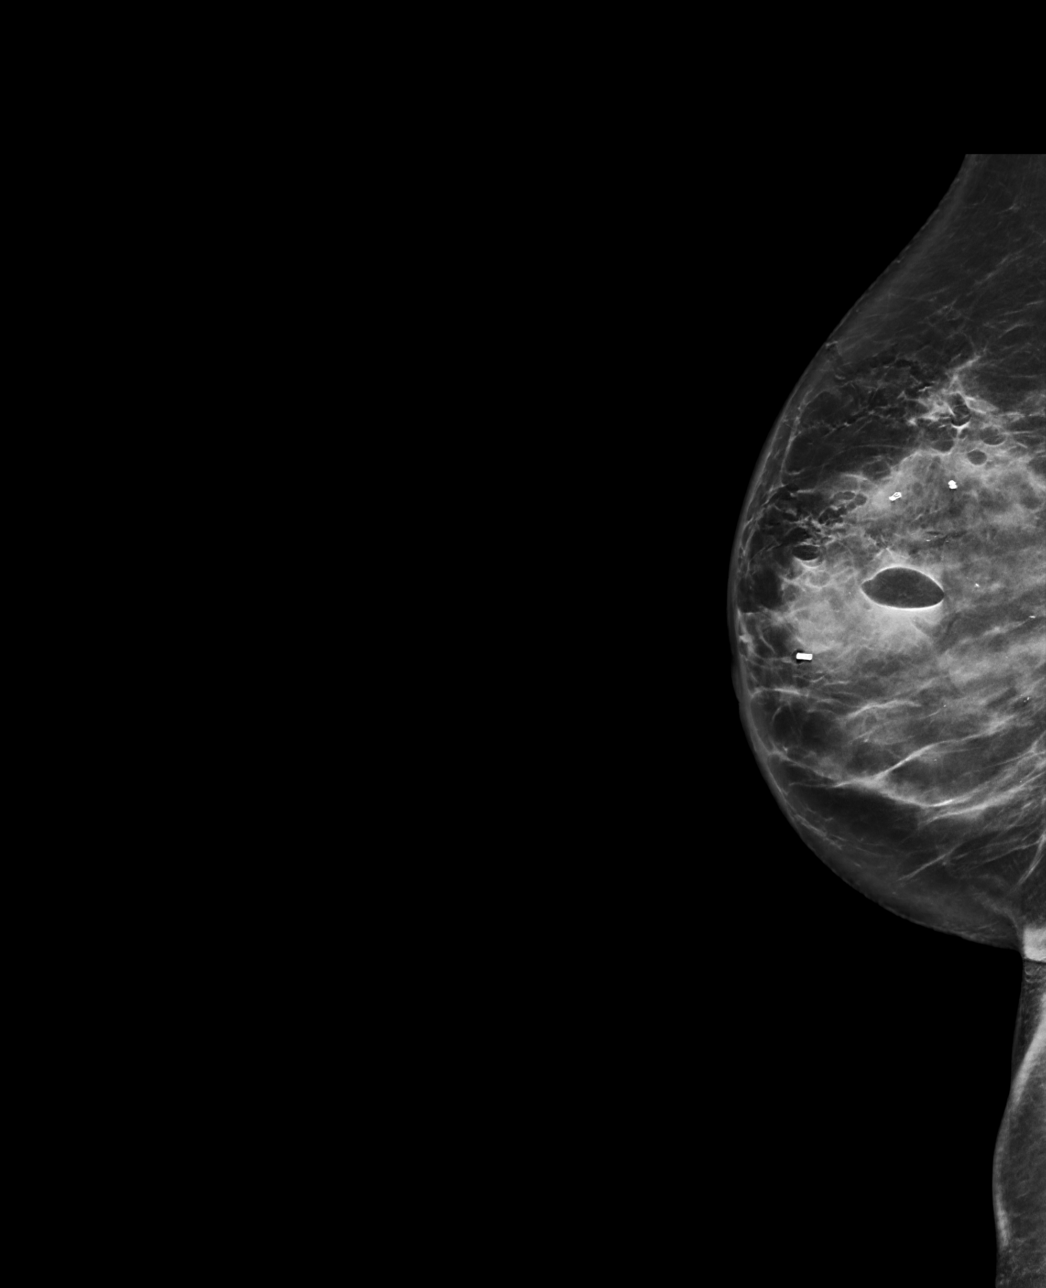

[R CC tomo · tomo slice 37/72.0]
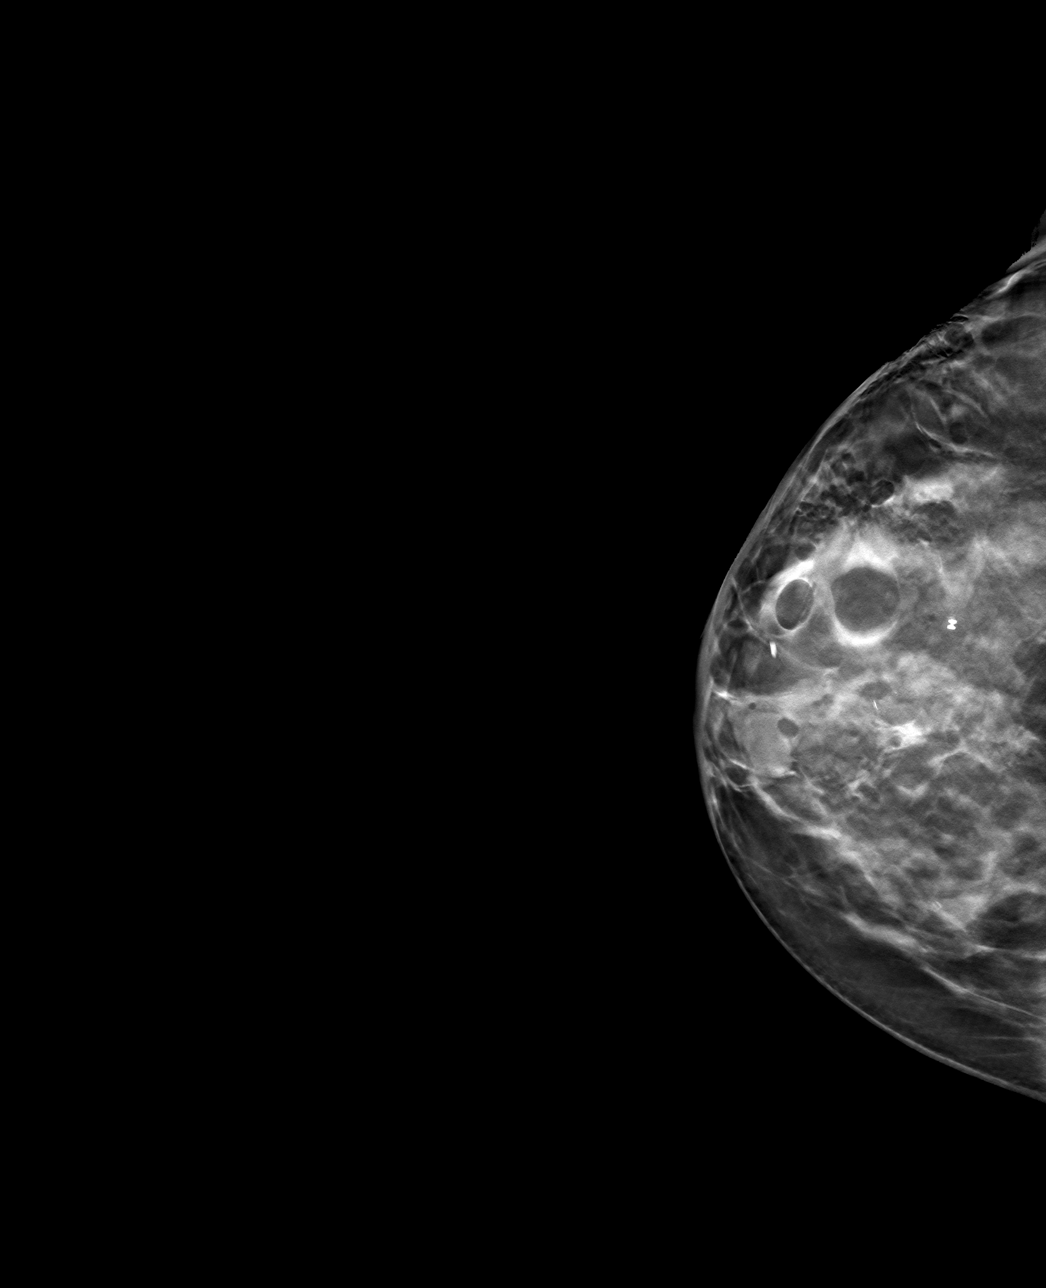

[R ML tomo · tomo slice 37/74.0]
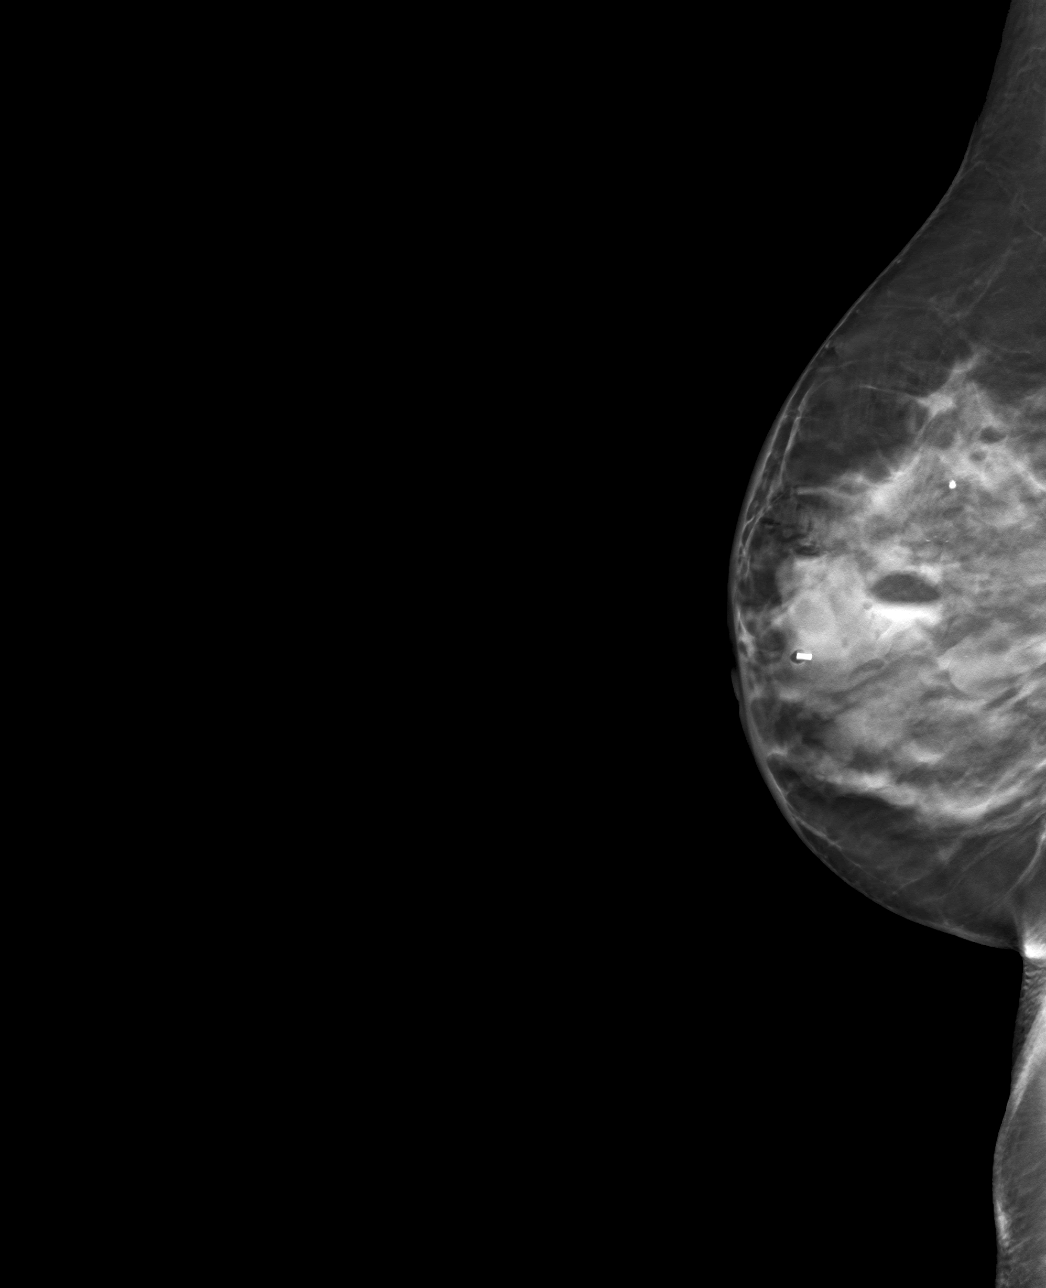

[4 of 12 positions shown; findings below may reference images not displayed]

FINDINGS: Mammographic images were obtained following MRI guided biopsy of non
mass enhancement in the anterior retroareolar right breast. The
cylinder biopsy marking clip is in expected position at the site of
biopsy.

Mammographic images were obtained following MRI guided biopsy of non
mass enhancement in the 12 o'clock posterior right breast. The
barbell biopsy marking clip is in expected position at the site of
biopsy.
IMPRESSION: 1. Appropriate positioning of the cylinder shaped biopsy marking
clip at the site of biopsy in the anterior retroareolar right
breast.

2. Appropriate positioning of the barbell shaped biopsy marking clip
at the site of biopsy in the 12 o'clock posterior right breast.

Final Assessment: Post Procedure Mammograms for Marker Placement

## 2020-04-01 MED ORDER — GADOBUTROL 1 MMOL/ML IV SOLN
6.0000 mL | Freq: Once | INTRAVENOUS | Status: AC | PRN
  Administered 2020-04-01: 6 mL via INTRAVENOUS

## 2020-04-02 ENCOUNTER — Encounter: Payer: Self-pay | Admitting: Plastic Surgery

## 2020-04-02 ENCOUNTER — Ambulatory Visit (INDEPENDENT_AMBULATORY_CARE_PROVIDER_SITE_OTHER): Payer: BC Managed Care – PPO | Admitting: Plastic Surgery

## 2020-04-02 VITALS — BP 133/79 | HR 72 | Temp 97.8°F | Ht 61.5 in | Wt 143.0 lb

## 2020-04-02 DIAGNOSIS — C50411 Malignant neoplasm of upper-outer quadrant of right female breast: Secondary | ICD-10-CM

## 2020-04-02 DIAGNOSIS — Z170 Estrogen receptor positive status [ER+]: Secondary | ICD-10-CM | POA: Diagnosis not present

## 2020-04-02 NOTE — Progress Notes (Signed)
Patient ID: Terri Gay, female    DOB: 07/23/1950, 69 y.o.   MRN: [id number]   Chief Complaint  Patient presents with  . Advice Only    The patient is a 69 year old female here with her husband for a consultation for breast reconstruction.  She palpated a mass of her right upper inner breast and went for a mammogram.  After a ultrasound and biopsy in October she was found to have invasive and in situ mammary carcinoma.  It was E-cadherin negative grade 2, estrogen and progesterone positive with a Ki-67 at 10% and HER-2 negative.  Her breast density was a category D.  She is 5 feet 1 inches tall and weighs 143 pounds.  Her preoperative bra size is a 36 C.  She has mild ptosis.  She does not have a family history of breast cancer and she is not a smoker.  She had an MRI yesterday and is waiting on the results.  She is seeing Dr. Marlou Starks.  She is not sure if she wants to do a partial mastectomy or a total mastectomy with reconstruction.  She has gastroesophageal reflux, hyperlipidemia, anxiety and hypertension.    Review of Systems  Constitutional: Negative.  Negative for activity change.  HENT: Negative.   Eyes: Negative.   Respiratory: Negative.  Negative for chest tightness and shortness of breath.   Cardiovascular: Negative.  Negative for leg swelling.  Gastrointestinal: Negative.  Negative for abdominal distention.  Endocrine: Negative.   Genitourinary: Negative.   Musculoskeletal: Negative.   Neurological: Negative.   Hematological: Negative.   Psychiatric/Behavioral: Negative.     Past Medical History:  Diagnosis Date  . Allergy    seasonal  . Anxiety   . Arthritis   . Depression   . Diverticulosis 10/07/2010   Colonoscopy  . GERD (gastroesophageal reflux disease)   . Hemorrhoids   . History of chicken pox   . Hyperlipidemia   . Hypertension   . Knee pain, left 10/24/2013  . Osteopenia 04/25/2013  . Osteoporosis   . Palpitations 04/26/2013  . Preventative health care  04/26/2013    Past Surgical History:  Procedure Laterality Date  . NO PAST SURGERIES        Current Outpatient Medications:  .  atorvastatin (LIPITOR) 20 MG tablet, Take 1 tablet (20 mg total) by mouth daily., Disp: 90 tablet, Rfl: 3 .  Bioflavonoid Products (VITAMIN C PLUS) 1000 MG TABS, Vitamin C, Disp: , Rfl:  .  bisoprolol (ZEBETA) 10 MG tablet, Take 1 tablet by mouth once daily, Disp: 90 tablet, Rfl: 2 .  Cetirizine HCl (ZYRTEC ALLERGY) 10 MG CAPS, Zyrtec, Disp: , Rfl:  .  Cholecalciferol (VITAMIN D-3) 5000 UNITS TABS, Take 5,000 Units by mouth daily. , Disp: , Rfl:  .  clonazePAM (KLONOPIN) 1 MG tablet, Take 1 mg by mouth every 8 (eight) hours as needed. for anxiety, Disp: , Rfl:  .  escitalopram (LEXAPRO) 10 MG tablet, Take 10 mg by mouth daily., Disp: , Rfl:  .  estradiol (VIVELLE-DOT) 0.025 MG/24HR, AS DIRECTED, Disp: , Rfl: 4 .  hydrochlorothiazide (MICROZIDE) 12.5 MG capsule, Take 1 capsule by mouth once daily, Disp: 90 capsule, Rfl: 2 .  hydrocortisone (ANUCORT-HC) 25 MG suppository, INSERT 1 SUPPOSITORY RECTALLY AT BEDTIME FOR 10 DAYS (Patient not taking: Reported on 03/17/2020), Disp: 24 suppository, Rfl: 0 .  LORazepam (ATIVAN) 0.5 MG tablet, Take 1 tablet 1 hour before procedure, then take 1 tablet right before procedure if needed,  Disp: 2 tablet, Rfl: 0 .  magnesium gluconate (MAGONATE) 500 MG tablet, Take 500 mg by mouth daily., Disp: , Rfl:  .  Omega-3 Fatty Acids (FISH OIL) 1200 MG CAPS, Take 1,200 mg by mouth daily., Disp: , Rfl:  .  progesterone (PROMETRIUM) 200 MG capsule, Take 200 mg by mouth at bedtime., Disp: , Rfl:  .  temazepam (RESTORIL) 30 MG capsule, Take 30 mg by mouth at bedtime as needed., Disp: , Rfl:  .  Zinc 50 MG CAPS, zinc, Disp: , Rfl:    Objective:   Vitals:   04/02/20 0915  BP: 133/79  Pulse: 72  Temp: 97.8 F (36.6 C)  SpO2: 97%    Physical Exam Vitals and nursing note reviewed.  Constitutional:      Appearance: Normal appearance.    HENT:     Head: Normocephalic and atraumatic.  Cardiovascular:     Rate and Rhythm: Normal rate.     Pulses: Normal pulses.  Pulmonary:     Effort: Pulmonary effort is normal.  Abdominal:     General: Abdomen is flat. There is no distension.  Skin:    General: Skin is warm.  Neurological:     General: No focal deficit present.     Mental Status: She is alert and oriented to person, place, and time.  Psychiatric:        Mood and Affect: Mood normal.        Behavior: Behavior normal.     Assessment & Plan:  Malignant neoplasm of upper-outer quadrant of right breast in female, estrogen receptor positive ([place])  We had a detailed conversation about the patient's options for breast reconstruction. Several reconstruction options were explained to the patient.  It is important to remember that breast reconstruction is an optional procedure. Reconstruction often requires several stages of surgery and this means more than one operation.  The surgeries are often done several months apart.  The entire process from start to finish can take a year or more. The major goal of breast reconstruction is to look normal in clothing. There will always be scars and a difference noticeable without clothes.  This is true for asymmetries where both breasts will not be identical.  Surgery may be needed or desired to the non-cancerous breast in order to achieve better symmetry and satisfactory results.  Regardless of the reconstructive method, there is always risks and the possibility that the procedure will fail or have complications.  This couls required additional surgeries.    We discussed the available methods of breast reconstruction and included:  1. Tissue expander with Acellular dermal matrix followed by implant based reconstruction. This can be done as one surgery or multiple surgeries.  2. Autologous reconstruction can include using a muscle or tissue from another area of the body for the  reconstruction.  3. Combined procedures like the latissismus dorsi flaps that often uses the muscle with an expander or implant.  For each of the method discussed the risks, benefits, scars and recovery time were discussed in detail. Specific risks included bleeding, infection, hematoma, seroma, scarring, pain, wound healing complications, flap loss, fat necrosis, capsular contracture, need for implant removal, donor site complications, bulge, hernia, umbilical necrosis, need for urgent reoperation, and need for dressing changes.   After the options were discussed we focused on the patient's desires and the procedure that was best for her based on all the information.  A total of 50 minutes of face-to-face time was spent in this encounter,  of which >50% was spent in counseling.   The patient is going to talk with Dr. Marlou Starks and decide when she wants to have a partial mastectomy or lumpectomy.  We will do a phone visit in a week to give her some time to think things over.  We discussed the options that are most likely viable for her will be implant-based reconstruction.  This would be an expander followed by an implant 3 months later.  From a cosmetic standpoint she could keep her nipple areola pending Dr. Ethlyn Gallery decision on the location.  Pictures were obtained of the patient and placed in the chart with the patient's or guardian's permission.  [place], DO

## 2020-04-05 ENCOUNTER — Encounter: Payer: Self-pay | Admitting: *Deleted

## 2020-04-05 ENCOUNTER — Other Ambulatory Visit: Payer: Self-pay | Admitting: Oncology

## 2020-04-05 ENCOUNTER — Telehealth: Payer: Self-pay | Admitting: *Deleted

## 2020-04-05 MED ORDER — ANASTROZOLE 1 MG PO TABS: 1.0000 mg | ORAL_TABLET | Freq: Every day | ORAL | 6 refills | Status: DC

## 2020-04-05 NOTE — Progress Notes (Signed)
Dr. Marlou Starks called to let me know the surgery was going to be delayed until January.  We decided to start anastrozole neoadjuvantly.  I called C[place] discussed it with her and she is agreeable.

## 2020-04-05 NOTE — Telephone Encounter (Signed)
Pt called with questions regarding tx plan. Returned pt call, pt relate she has spoken to Dr. Marlou Starks and surgery plan will now consist of mastectomy.  Denies further needs or questions at this time.

## 2020-04-06 ENCOUNTER — Telehealth: Payer: Self-pay | Admitting: Oncology

## 2020-04-06 ENCOUNTER — Ambulatory Visit: Payer: Self-pay | Admitting: General Surgery

## 2020-04-06 DIAGNOSIS — Z170 Estrogen receptor positive status [ER+]: Secondary | ICD-10-CM

## 2020-04-06 DIAGNOSIS — C50411 Malignant neoplasm of upper-outer quadrant of right female breast: Secondary | ICD-10-CM

## 2020-04-06 NOTE — Telephone Encounter (Signed)
Scheduled appt per 11/15 sch msg - pt is aware of appt date and time  ° °

## 2020-04-09 ENCOUNTER — Other Ambulatory Visit: Payer: Self-pay

## 2020-04-09 ENCOUNTER — Telehealth (INDEPENDENT_AMBULATORY_CARE_PROVIDER_SITE_OTHER): Payer: BC Managed Care – PPO | Admitting: Plastic Surgery

## 2020-04-09 ENCOUNTER — Encounter: Payer: Self-pay | Admitting: Plastic Surgery

## 2020-04-09 DIAGNOSIS — C50411 Malignant neoplasm of upper-outer quadrant of right female breast: Secondary | ICD-10-CM | POA: Diagnosis not present

## 2020-04-09 DIAGNOSIS — Z170 Estrogen receptor positive status [ER+]: Secondary | ICD-10-CM

## 2020-04-09 NOTE — Progress Notes (Signed)
   Subjective:    Patient ID: Terri Gay, female    DOB: [birth year], 69 y.o.   MRN: [id number]  The patient is a 69 yrs old wf joining me for a phone visit to discuss her breast cancer.  She has decided that she is going to wait until after Christmas to have any surgical intervention.  She understands that a right mastectomy is most likely her best surgical option for treatment.  She is not sure she wants to do reconstruction or not.  She is 5 feet 1 inches tall weighs 143 pounds.  Her preoperative bra size is a 36 C.       Review of Systems  Constitutional: Negative.   HENT: Negative.   Eyes: Negative.   Respiratory: Negative.   Cardiovascular: Negative.   Genitourinary: Negative.        Objective:   Physical Exam Neurological:     Mental Status: She is alert.        Assessment & Plan:     ICD-10-CM   1. Malignant neoplasm of upper-outer quadrant of right breast in female, estrogen receptor positive ([place])  C50.411    Z17.0     I connected with  Terri Gay on 08/65/78 by a phone application and verified that I am speaking with the correct person using two identifiers.   I discussed the limitations of evaluation and management by telemedicine. The patient expressed understanding and agreed to proceed.  We spoke for 10 minutes.  The patient will follow up after Christmas for further discussion.  Right now she is going to start oral therapy until after the holidays.

## 2020-04-13 NOTE — Progress Notes (Signed)
Nutrition  Patient identified by attending [facility] on 03/17/2020.  Patient was given nutrition packet with RD contact information by nurse navigator.   Chart reviewed.   69 year old female with new diagnosis of breast cancer.  Planning to start anastrozole until surgery, oncotype, radiation and antiestrogens.    Ht: 61 inches Wt: 143 lb BMI: 26  Patient currently not at nutritional risk.  Please consult RD if changes in nutritional status occur.   Darlisa Spruiell B. Zenia Resides, [place], [place] Registered Dietitian [phone] (mobile)

## 2020-04-26 ENCOUNTER — Encounter: Payer: Self-pay | Admitting: *Deleted

## 2020-04-28 ENCOUNTER — Other Ambulatory Visit: Payer: Self-pay

## 2020-04-28 ENCOUNTER — Inpatient Hospital Stay: Payer: BC Managed Care – PPO | Attending: Ophthalmology | Admitting: Genetic Counselor

## 2020-04-28 ENCOUNTER — Inpatient Hospital Stay: Payer: BC Managed Care – PPO

## 2020-04-28 DIAGNOSIS — Z170 Estrogen receptor positive status [ER+]: Secondary | ICD-10-CM

## 2020-04-28 DIAGNOSIS — C50411 Malignant neoplasm of upper-outer quadrant of right female breast: Secondary | ICD-10-CM

## 2020-04-29 ENCOUNTER — Encounter: Payer: Self-pay | Admitting: Genetic Counselor

## 2020-04-29 NOTE — Progress Notes (Signed)
REFERRING PROVIDER: Chauncey Cruel, MD [address] [place],  [place] [address]  PRIMARY PROVIDER:  Dorothyann Peng, NP  PRIMARY REASON FOR VISIT:  1. Malignant neoplasm of upper-outer quadrant of right breast in female, estrogen receptor positive (Terri Gay)      HISTORY OF PRESENT ILLNESS:   Terri Gay, a 69 y.o. female, was seen for a [place] cancer genetics consultation at the request of Dr. Jana Hakim due to a personal history of breast cancer.  Terri Gay presents to clinic today to discuss the possibility of a hereditary predisposition to cancer, genetic testing, and to further clarify her future cancer risks, as well as potential cancer risks for family members.   In November 2021, at the age of 39, Terri Gay was diagnosed with invasive lobular carcinoma.     CANCER HISTORY:  Oncology History   No history exists.     RISK FACTORS:  Menarche was at age 12.  First live birth at age 56.  OCP use for approximately 10+ years.  Ovaries intact: yes.  Hysterectomy: no.  Menopausal status: postmenopausal.  HRT use: 0 years. Colonoscopy: yes; normal. Mammogram within the last year: yes. Number of breast biopsies: 1. Up to date with pelvic exams: yes. Any excessive radiation exposure in the past: no  Past Medical History:  Diagnosis Date  . Allergy    seasonal  . Anxiety   . Arthritis   . Depression   . Diverticulosis 10/07/2010   Colonoscopy  . GERD (gastroesophageal reflux disease)   . Hemorrhoids   . History of chicken pox   . Hyperlipidemia   . Hypertension   . Knee pain, left 10/24/2013  . Osteopenia 04/25/2013  . Osteoporosis   . Palpitations 04/26/2013  . Preventative health care 04/26/2013    Past Surgical History:  Procedure Laterality Date  . NO PAST SURGERIES      Social History   Socioeconomic History  . Marital status: Married    Spouse name: Not on file  . Number of children: 2  . Years of education: Not on file  . Highest education  level: Not on file  Occupational History  . Occupation: Research scientist (physical sciences): [employer]  Tobacco Use  . Smoking status: Never Smoker  . Smokeless tobacco: Never Used  Substance and Sexual Activity  . Alcohol use: Yes    Alcohol/week: 1.0 standard drink    Types: 1 Glasses of wine per week    Comment: occasionally  . Drug use: No  . Sexual activity: Not on file  Other Topics Concern  . Not on file  Social History Narrative   Retired from [employer]    Married for 27 years    Have one son and one daughter ( live in [place])       She likes to dance, read, and play with her dogs.          Caffeine Use: 1 cup coffee and 1 cup tea      Social Determinants of Health   Financial Resource Strain: Not on file  Food Insecurity: Not on file  Transportation Needs: Not on file  Physical Activity: Not on file  Stress: Not on file  Social Connections: Not on file     FAMILY HISTORY:  We obtained a detailed, 4-generation family history.  Significant diagnoses are listed below: Family History  Problem Relation Age of Onset  . Lung cancer Father        d.  31  . Lung cancer Paternal Grandfather        smoker  . Diabetes Mother   . Arthritis Mother   . Hyperlipidemia Mother   . Hypertension Mother   . Heart disease Mother        CHF  . Diabetes Maternal Grandmother   . Heart disease Maternal Grandmother   . Arthritis Maternal Grandmother   . Hyperlipidemia Maternal Grandmother   . Hypertension Maternal Grandmother   . Hyperlipidemia Sister   . Arthritis Paternal Grandmother   . Heart disease Daughter        arrythmia, Internal Cardiac Defibrillator  . Lupus Son   . Drug abuse Son   . Colon cancer Neg Hx   . Esophageal cancer Neg Hx   . Rectal cancer Neg Hx   . Stomach cancer Neg Hx     The patient has a son and daughter who are cancer free.  She has a brother and sister who are cancer free.  Both parents are deceased.  The patient's father had  lung cancer.  He smoked as a young man, was in the [employer] and worked as a Dealer.  The patient's father had 10+ siblings, some were full and some were half siblings.  None were reported to have cancer.  His father was a smoker and had lung cancer.  The patient's mother died at 67.  She was an only child.  Her parents were not reported to have cancer.  Ms. Sahota is unaware of previous family history of genetic testing for hereditary cancer risks. Patient's maternal ancestors are of [place] and [place] descent, and paternal ancestors are of [place] and [place] descent. There is no reported Ashkenazi Jewish ancestry. There is no known consanguinity.  GENETIC COUNSELING ASSESSMENT: Terri Gay is a 69 y.o. female with a personal history of cancer which is somewhat suggestive of a sporadic predisposition to cancer given her age of onset and lack of family history. We, therefore, discussed and recommended the following at today's visit.   DISCUSSION: We discussed that 5 - 10% of breast cancer is hereditary, with most cases associated with BRCA mutations.  There are other genes that can be associated with hereditary breast cancer syndromes.  These include ATM, CHEK2 and PALB2.  We discussed that testing is beneficial for several reasons including knowing how to follow individuals after completing their treatment, identifying whether potential treatment options such as PARP inhibitors would be beneficial, and understand if other family members could be at risk for cancer and allow them to undergo genetic testing.   We discussed with Ms. Shanon Brow that the personal history does not meet insurance or NCCN criteria for genetic testing and, therefore, is not highly consistent with a familial hereditary cancer syndrome.  We feel she is at low risk to harbor a gene mutation associated with such a condition. Ms. Fowers still wanted to undergo genetic testing.  Therefore, we reviewed the characteristics, features and  inheritance patterns of hereditary cancer syndromes. We also discussed genetic testing, including the appropriate family members to test, the process of testing, insurance coverage and turn-around-time for results. We discussed the implications of a negative, positive, carrier and/or variant of uncertain significant result. We recommended Ms. Coppess pursue genetic testing for the common hereditary cancer panel+RNA gene panel. The Common Hereditary Gene Panel offered by [facility] includes sequencing and/or deletion duplication testing of the following 47 genes: APC, ATM, AXIN2, BARD1, BMPR1A, BRCA1, BRCA2, BRIP1, CDH1, CDK4, CDKN2A (p14ARF), CDKN2A (p16INK4a), CHEK2, CTNNA1,  DICER1, EPCAM (Deletion/duplication testing only), GREM1 (promoter region deletion/duplication testing only), KIT, MEN1, MLH1, MSH2, MSH3, MSH6, MUTYH, NBN, NF1, NHTL1, PALB2, PDGFRA, PMS2, POLD1, POLE, PTEN, RAD50, RAD51C, RAD51D, SDHB, SDHC, SDHD, SMAD4, SMARCA4. STK11, TP53, TSC1, TSC2, and VHL.  The following genes were evaluated for sequence changes only: SDHA and HOXB13 c.251G>A variant only.   Based on Ms. Minney's personal history of cancer, she does not meet medical criteria for genetic testing. Her out of pocket cost will be $250.  PLAN: After considering the risks, benefits, and limitations, Ms. Wormley provided informed consent to pursue genetic testing and the blood sample was sent to [facility] for analysis of the common hereditary cancer panel + RNA. Results should be available within approximately 2-3 weeks' time, at which point they will be disclosed by telephone to Ms. Coddington, as will any additional recommendations warranted by these results. Ms. Cheese will receive a summary of her genetic counseling visit and a copy of her results once available. This information will also be available in Epic.   Lastly, we encouraged Ms. Lowenstein to remain in contact with cancer genetics annually so that we can continuously update the  family history and inform her of any changes in cancer genetics and testing that may be of benefit for this family.   Ms. Arkin questions were answered to her satisfaction today. Our contact information was provided should additional questions or concerns arise. Thank you for the referral and allowing [place] to share in the care of your patient.   Milcah Dulany P. Florene Glen, [place], [facility] Licensed, Insurance risk surveyor [email or web address] phone: [phone]  The patient was seen for a total of 35 minutes in face-to-face genetic counseling.  This patient was discussed with Drs. Magrinat, Lindi Adie and/or Burr Medico who agrees with the above.    _______________________________________________________________________ For Office Staff:  Number of people involved in session: 2 Was an Intern/ student involved with case: no

## 2020-05-17 ENCOUNTER — Encounter: Payer: Self-pay | Admitting: Genetic Counselor

## 2020-05-17 ENCOUNTER — Telehealth: Payer: Self-pay | Admitting: Genetic Counselor

## 2020-05-17 ENCOUNTER — Ambulatory Visit: Payer: Self-pay | Admitting: Genetic Counselor

## 2020-05-17 DIAGNOSIS — Z1379 Encounter for other screening for genetic and chromosomal anomalies: Secondary | ICD-10-CM

## 2020-05-17 NOTE — Progress Notes (Signed)
HPI:  Ms. Menton was previously seen in the [place] clinic due to a personal and family history of cancer and concerns regarding a hereditary predisposition to cancer. Please refer to our prior cancer genetics clinic note for more information regarding our discussion, assessment and recommendations, at the time. Ms. Gorniak recent genetic test results were disclosed to her, as were recommendations warranted by these results. These results and recommendations are discussed in more detail below.  CANCER HISTORY:  Oncology History  Malignant neoplasm of upper-outer quadrant of right breast in female, estrogen receptor positive ([place])  03/11/2020 Initial Diagnosis   Malignant neoplasm of upper-outer quadrant of right breast in female, estrogen receptor positive ([place])   05/16/2020 Genetic Testing   Negative genetic testing.  EGFR c.3629C>T (p.Ala1210Val) and NTHL1 c.755C>G (p.Thr252Ser)  VUS identified.  The Multi-Gene Panel offered by [facility] includes sequencing and/or deletion duplication testing of the following 84 genes: AIP, ALK, APC, ATM, AXIN2,BAP1,  BARD1, BLM, BMPR1A, BRCA1, BRCA2, BRIP1, CASR, CDC73, CDH1, CDK4, CDKN1B, CDKN1C, CDKN2A (p14ARF), CDKN2A (p16INK4a), CEBPA, CHEK2, CTNNA1, DICER1, DIS3L2, EGFR (c.2369C>T, p.Thr790Met variant only), EPCAM (Deletion/duplication testing only), FH, FLCN, GATA2, GPC3, GREM1 (Promoter region deletion/duplication testing only), HOXB13 (c.251G>A, p.Gly84Glu), HRAS, KIT, MAX, MEN1, MET, MITF (c.952G>A, p.Glu318Lys variant only), MLH1, MSH2, MSH3, MSH6, MUTYH, NBN, NF1, NF2, NTHL1, PALB2, PDGFRA, PHOX2B, PMS2, POLD1, POLE, POT1, PRKAR1A, PTCH1, PTEN, RAD50, RAD51C, RAD51D, RB1, RECQL4, RET, RUNX1, SDHAF2, SDHA (sequence changes only), SDHB, SDHC, SDHD, SMAD4, SMARCA4, SMARCB1, SMARCE1, STK11, SUFU, TERC, TERT, TMEM127, TP53, TSC1, TSC2, VHL, WRN and WT1.  The report date is May 16, 2020.     FAMILY HISTORY:  We obtained a detailed,  4-generation family history.  Significant diagnoses are listed below: Family History  Problem Relation Age of Onset  . Lung cancer Father        d. 48  . Lung cancer Paternal Grandfather        smoker  . Diabetes Mother   . Arthritis Mother   . Hyperlipidemia Mother   . Hypertension Mother   . Heart disease Mother        CHF  . Diabetes Maternal Grandmother   . Heart disease Maternal Grandmother   . Arthritis Maternal Grandmother   . Hyperlipidemia Maternal Grandmother   . Hypertension Maternal Grandmother   . Hyperlipidemia Sister   . Arthritis Paternal Grandmother   . Heart disease Daughter        arrythmia, Internal Cardiac Defibrillator  . Lupus Son   . Drug abuse Son   . Colon cancer Neg Hx   . Esophageal cancer Neg Hx   . Rectal cancer Neg Hx   . Stomach cancer Neg Hx     The patient has a son and daughter who are cancer free.  She has a brother and sister who are cancer free.  Both parents are deceased.  The patient's father had lung cancer.  He smoked as a young man, was in the [employer] and worked as a Dealer.  The patient's father had 10+ siblings, some were full and some were half siblings.  None were reported to have cancer.  His father was a smoker and had lung cancer.  The patient's mother died at 64.  She was an only child.  Her parents were not reported to have cancer.  Ms. Sanguinetti is unaware of previous family history of genetic testing for hereditary cancer risks. Patient's maternal ancestors are of [place] and [place] descent, and paternal ancestors are of  [place] and [place] descent. There is no reported Ashkenazi Jewish ancestry. There is no known consanguinity.  GENETIC TEST RESULTS: Genetic testing reported out on May 16, 2020 through the multi-cancer panel+RNA found no pathogenic mutations. The Multi-Gene Panel offered by [facility] includes sequencing and/or deletion duplication testing of the following 84 genes: AIP, ALK, APC, ATM, AXIN2,BAP1,   BARD1, BLM, BMPR1A, BRCA1, BRCA2, BRIP1, CASR, CDC73, CDH1, CDK4, CDKN1B, CDKN1C, CDKN2A (p14ARF), CDKN2A (p16INK4a), CEBPA, CHEK2, CTNNA1, DICER1, DIS3L2, EGFR (c.2369C>T, p.Thr790Met variant only), EPCAM (Deletion/duplication testing only), FH, FLCN, GATA2, GPC3, GREM1 (Promoter region deletion/duplication testing only), HOXB13 (c.251G>A, p.Gly84Glu), HRAS, KIT, MAX, MEN1, MET, MITF (c.952G>A, p.Glu318Lys variant only), MLH1, MSH2, MSH3, MSH6, MUTYH, NBN, NF1, NF2, NTHL1, PALB2, PDGFRA, PHOX2B, PMS2, POLD1, POLE, POT1, PRKAR1A, PTCH1, PTEN, RAD50, RAD51C, RAD51D, RB1, RECQL4, RET, RUNX1, SDHAF2, SDHA (sequence changes only), SDHB, SDHC, SDHD, SMAD4, SMARCA4, SMARCB1, SMARCE1, STK11, SUFU, TERC, TERT, TMEM127, TP53, TSC1, TSC2, VHL, WRN and WT1. The test report has been scanned into EPIC and is located under the Molecular Pathology section of the Results Review tab.  A portion of the result report is included below for reference.     We discussed with Ms. Seawright that because current genetic testing is not perfect, it is possible there may be a gene mutation in one of these genes that current testing cannot detect, but that chance is small.  We also discussed, that there could be another gene that has not yet been discovered, or that we have not yet tested, that is responsible for the cancer diagnoses in the family. It is also possible there is a hereditary cause for the cancer in the family that Ms. Wah did not inherit and therefore was not identified in her testing.  Therefore, it is important to remain in touch with cancer genetics in the future so that we can continue to offer Ms. Fussner the most up to date genetic testing.   Genetic testing did identify two Variants of uncertain significance (VUS) - one in the EGFR gene called c.3629C>T (p.Ala1210Val) and a second in the NTHL1 gene called c.755C>G (p.Thr252Ser).  At this time, it is unknown if these variants are associated with increased cancer risk or if  they are normal findings, but most variants such as these get reclassified to being inconsequential. They should not be used to make medical management decisions. With time, we suspect the lab will determine the significance of these variants, if any. If we do learn more about them, we will try to contact Ms. Auguste to discuss it further. However, it is important to stay in touch with [place] periodically and keep the address and phone number up to date.  ADDITIONAL GENETIC TESTING: We discussed with Ms. Frick that her genetic testing was fairly extensive.  If there are genes identified to increase cancer risk that can be analyzed in the future, we would be happy to discuss and coordinate this testing at that time.    CANCER SCREENING RECOMMENDATIONS: Ms. Genther test result is considered negative (normal).  This means that we have not identified a hereditary cause for her personal and family history of cancer at this time. Most cancers happen by chance and this negative test suggests that her cancer may fall into this category.    While reassuring, this does not definitively rule out a hereditary predisposition to cancer. It is still possible that there could be genetic mutations that are undetectable by current technology. There could be genetic mutations in genes that have  not been tested or identified to increase cancer risk.  Therefore, it is recommended she continue to follow the cancer management and screening guidelines provided by her oncology and primary healthcare provider.   An individual's cancer risk and medical management are not determined by genetic test results alone. Overall cancer risk assessment incorporates additional factors, including personal medical history, family history, and any available genetic information that may result in a personalized plan for cancer prevention and surveillance  RECOMMENDATIONS FOR FAMILY MEMBERS:  Individuals in this family might be at some increased risk of  developing cancer, over the general population risk, simply due to the family history of cancer.  We recommended women in this family have a yearly mammogram beginning at age 24, or 66 years younger than the earliest onset of cancer, an annual clinical breast exam, and perform monthly breast self-exams. Women in this family should also have a gynecological exam as recommended by their primary provider. All family members should be referred for colonoscopy starting at age 7.  FOLLOW-UP: Lastly, we discussed with Ms. Moffat that cancer genetics is a rapidly advancing field and it is possible that new genetic tests will be appropriate for her and/or her family members in the future. We encouraged her to remain in contact with cancer genetics on an annual basis so we can update her personal and family histories and let her know of advances in cancer genetics that may benefit this family.   Our contact number was provided. Ms. Sforza questions were answered to her satisfaction, and she knows she is welcome to call us at anytime with additional questions or concerns.   Roma Kayser, [place], [facility] Licensed, Certified Genetic Counselor [email or web address]

## 2020-05-17 NOTE — Telephone Encounter (Signed)
Revealed negative genetic testing.  Discussed that we do not know why she has breast cancer or why there is cancer in the family. It could be due to a different gene that we are not testing, or maybe our current technology may not be able to pick something up.  It will be important for her to keep in contact with genetics to keep up with whether additional testing may be needed.   Two VUS identified.  These will not change medical management.

## 2020-06-01 ENCOUNTER — Encounter (HOSPITAL_BASED_OUTPATIENT_CLINIC_OR_DEPARTMENT_OTHER): Payer: Self-pay | Admitting: General Surgery

## 2020-06-01 ENCOUNTER — Other Ambulatory Visit: Payer: Self-pay

## 2020-06-03 ENCOUNTER — Encounter: Payer: Self-pay | Admitting: *Deleted

## 2020-06-05 ENCOUNTER — Other Ambulatory Visit (HOSPITAL_COMMUNITY)
Admission: RE | Admit: 2020-06-05 | Discharge: 2020-06-05 | Disposition: A | Discharge status: Home / Self Care | Payer: BC Managed Care – PPO | Source: Ambulatory Visit | Attending: General Surgery | Admitting: General Surgery

## 2020-06-05 DIAGNOSIS — Z01812 Encounter for preprocedural laboratory examination: Secondary | ICD-10-CM | POA: Insufficient documentation

## 2020-06-05 DIAGNOSIS — Z20822 Contact with and (suspected) exposure to covid-19: Secondary | ICD-10-CM | POA: Diagnosis not present

## 2020-06-05 LAB — SARS CORONAVIRUS 2 (TAT 6-24 HRS): SARS Coronavirus 2: NEGATIVE

## 2020-06-08 ENCOUNTER — Encounter (HOSPITAL_BASED_OUTPATIENT_CLINIC_OR_DEPARTMENT_OTHER)
Admission: RE | Admit: 2020-06-08 | Discharge: 2020-06-08 | Disposition: A | Discharge status: Home / Self Care | Payer: BC Managed Care – PPO | Source: Ambulatory Visit | Attending: General Surgery | Admitting: General Surgery

## 2020-06-08 DIAGNOSIS — Z880 Allergy status to penicillin: Secondary | ICD-10-CM | POA: Diagnosis not present

## 2020-06-08 DIAGNOSIS — Z01818 Encounter for other preprocedural examination: Secondary | ICD-10-CM | POA: Insufficient documentation

## 2020-06-08 DIAGNOSIS — C50211 Malignant neoplasm of upper-inner quadrant of right female breast: Secondary | ICD-10-CM | POA: Diagnosis not present

## 2020-06-08 DIAGNOSIS — C50911 Malignant neoplasm of unspecified site of right female breast: Secondary | ICD-10-CM | POA: Diagnosis not present

## 2020-06-08 DIAGNOSIS — I10 Essential (primary) hypertension: Secondary | ICD-10-CM | POA: Diagnosis not present

## 2020-06-08 DIAGNOSIS — Z170 Estrogen receptor positive status [ER+]: Secondary | ICD-10-CM | POA: Diagnosis not present

## 2020-06-08 LAB — BASIC METABOLIC PANEL WITH GFR
Anion gap: 9 (ref 5–15)
BUN: 12 mg/dL (ref 8–23)
CO2: 27 mmol/L (ref 22–32)
Calcium: 9.7 mg/dL (ref 8.9–10.3)
Chloride: 105 mmol/L (ref 98–111)
Creatinine, Ser: 0.81 mg/dL (ref 0.44–1.00)
GFR, Estimated: >60 mL/min
Glucose, Bld: 102 mg/dL — ABNORMAL HIGH (ref 70–99)
Potassium: 4 mmol/L (ref 3.5–5.1)
Sodium: 141 mmol/L (ref 135–145)

## 2020-06-08 NOTE — Progress Notes (Signed)

## 2020-06-08 NOTE — Anesthesia Preprocedure Evaluation (Addendum)
Anesthesia Evaluation  Patient identified by MRN, date of birth, ID band Patient awake    Reviewed: Allergy & Precautions, NPO status , Patient's Chart, lab work & pertinent test results  Airway Mallampati: II  TM Distance: >3 FB Neck ROM: Full    Dental no notable dental hx. (+) Teeth Intact, Dental Advisory Given   Pulmonary neg pulmonary ROS,    Pulmonary exam normal breath sounds clear to auscultation       Cardiovascular hypertension, Pt. on home beta blockers and Pt. on medications Normal cardiovascular exam Rhythm:Regular Rate:Normal     Neuro/Psych PSYCHIATRIC DISORDERS Anxiety Depression negative neurological ROS     GI/Hepatic Neg liver ROS, GERD  Controlled,  Endo/Other  negative endocrine ROS  Renal/GU negative Renal ROS  negative genitourinary   Musculoskeletal  (+) Arthritis , Osteoarthritis,    Abdominal   Peds  Hematology negative hematology ROS (+)   Anesthesia Other Findings   Reproductive/Obstetrics negative OB ROS                            Anesthesia Physical Anesthesia Plan  ASA: II  Anesthesia Plan: General and Regional   Post-op Pain Management: GA combined w/ Regional for post-op pain   Induction: Intravenous  PONV Risk Score and Plan: 3 and Ondansetron, Dexamethasone, Midazolam and Treatment may vary due to age or medical condition  Airway Management Planned: LMA  Additional Equipment: None  Intra-op Plan:   Post-operative Plan: Extubation in OR  Informed Consent: I have reviewed the patients History and Physical, chart, labs and discussed the procedure including the risks, benefits and alternatives for the proposed anesthesia with the patient or authorized representative who has indicated his/her understanding and acceptance.     Dental advisory given  Plan Discussed with: CRNA  Anesthesia Plan Comments:        Anesthesia Quick  Evaluation

## 2020-06-09 ENCOUNTER — Ambulatory Visit (HOSPITAL_BASED_OUTPATIENT_CLINIC_OR_DEPARTMENT_OTHER): Payer: BC Managed Care – PPO | Admitting: Anesthesiology

## 2020-06-09 ENCOUNTER — Encounter (HOSPITAL_BASED_OUTPATIENT_CLINIC_OR_DEPARTMENT_OTHER)
Admission: RE | Disposition: A | Discharge status: Home / Self Care | Payer: Self-pay | Source: Home / Self Care | Attending: General Surgery

## 2020-06-09 ENCOUNTER — Ambulatory Visit (HOSPITAL_BASED_OUTPATIENT_CLINIC_OR_DEPARTMENT_OTHER)
Admission: RE | Admit: 2020-06-09 | Discharge: 2020-06-10 | Disposition: A | Discharge status: Home / Self Care | Payer: BC Managed Care – PPO | Attending: General Surgery | Admitting: General Surgery

## 2020-06-09 ENCOUNTER — Other Ambulatory Visit: Payer: Self-pay

## 2020-06-09 ENCOUNTER — Encounter (HOSPITAL_COMMUNITY)
Admission: RE | Admit: 2020-06-09 | Discharge: 2020-06-09 | Disposition: A | Discharge status: Home / Self Care | Payer: BC Managed Care – PPO | Source: Ambulatory Visit | Attending: General Surgery | Admitting: General Surgery

## 2020-06-09 ENCOUNTER — Encounter (HOSPITAL_BASED_OUTPATIENT_CLINIC_OR_DEPARTMENT_OTHER): Payer: Self-pay | Admitting: General Surgery

## 2020-06-09 DIAGNOSIS — C50911 Malignant neoplasm of unspecified site of right female breast: Secondary | ICD-10-CM | POA: Diagnosis present

## 2020-06-09 DIAGNOSIS — C50211 Malignant neoplasm of upper-inner quadrant of right female breast: Secondary | ICD-10-CM | POA: Diagnosis not present

## 2020-06-09 DIAGNOSIS — I10 Essential (primary) hypertension: Secondary | ICD-10-CM | POA: Insufficient documentation

## 2020-06-09 DIAGNOSIS — Z170 Estrogen receptor positive status [ER+]: Secondary | ICD-10-CM | POA: Insufficient documentation

## 2020-06-09 DIAGNOSIS — C773 Secondary and unspecified malignant neoplasm of axilla and upper limb lymph nodes: Secondary | ICD-10-CM | POA: Diagnosis not present

## 2020-06-09 DIAGNOSIS — G8918 Other acute postprocedural pain: Secondary | ICD-10-CM | POA: Diagnosis not present

## 2020-06-09 DIAGNOSIS — Z880 Allergy status to penicillin: Secondary | ICD-10-CM | POA: Insufficient documentation

## 2020-06-09 DIAGNOSIS — C50411 Malignant neoplasm of upper-outer quadrant of right female breast: Secondary | ICD-10-CM

## 2020-06-09 HISTORY — PX: MASTECTOMY W/ SENTINEL NODE BIOPSY: SHX2001

## 2020-06-09 SURGERY — MASTECTOMY WITH SENTINEL LYMPH NODE BIOPSY
Anesthesia: Regional | Site: Breast | Laterality: Right

## 2020-06-09 MED ORDER — VANCOMYCIN HCL IN DEXTROSE 1-5 GM/200ML-% IV SOLN
INTRAVENOUS | Status: AC
  Filled 2020-06-09: qty 200

## 2020-06-09 MED ORDER — CHLORHEXIDINE GLUCONATE CLOTH 2 % EX PADS: 6.0000 | MEDICATED_PAD | Freq: Once | CUTANEOUS | Status: DC

## 2020-06-09 MED ORDER — GABAPENTIN 300 MG PO CAPS
300.0000 mg | ORAL_CAPSULE | ORAL | Status: AC
  Administered 2020-06-09: 300 mg via ORAL

## 2020-06-09 MED ORDER — ACETAMINOPHEN 500 MG PO TABS: 500.0000 mg | ORAL_TABLET | Freq: Four times a day (QID) | ORAL | Status: DC | PRN

## 2020-06-09 MED ORDER — TECHNETIUM TC 99M TILMANOCEPT KIT
1.0000 | PACK | Freq: Once | INTRAVENOUS | Status: AC | PRN
  Administered 2020-06-09: 1 via INTRADERMAL

## 2020-06-09 MED ORDER — ACETAMINOPHEN 500 MG PO TABS
ORAL_TABLET | ORAL | Status: AC
  Filled 2020-06-09: qty 2

## 2020-06-09 MED ORDER — ACETAMINOPHEN 500 MG PO TABS
1000.0000 mg | ORAL_TABLET | ORAL | Status: AC
  Administered 2020-06-09: 1000 mg via ORAL

## 2020-06-09 MED ORDER — METHOCARBAMOL 500 MG PO TABS: 500.0000 mg | ORAL_TABLET | Freq: Four times a day (QID) | ORAL | Status: DC | PRN

## 2020-06-09 MED ORDER — BISOPROLOL FUMARATE 10 MG PO TABS
10.0000 mg | ORAL_TABLET | Freq: Every day | ORAL | Status: DC
  Filled 2020-06-09: qty 1

## 2020-06-09 MED ORDER — HEPARIN SODIUM (PORCINE) 5000 UNIT/ML IJ SOLN
INTRAMUSCULAR | Status: AC
  Filled 2020-06-09: qty 1

## 2020-06-09 MED ORDER — FENTANYL CITRATE (PF) 100 MCG/2ML IJ SOLN
100.0000 ug | Freq: Once | INTRAMUSCULAR | Status: AC
  Administered 2020-06-09: 100 ug via INTRAVENOUS

## 2020-06-09 MED ORDER — ONDANSETRON HCL 4 MG/2ML IJ SOLN
INTRAMUSCULAR | Status: AC
  Filled 2020-06-09: qty 2

## 2020-06-09 MED ORDER — ATORVASTATIN CALCIUM 20 MG PO TABS
20.0000 mg | ORAL_TABLET | Freq: Every day | ORAL | Status: DC
  Administered 2020-06-09: 20 mg via ORAL
  Filled 2020-06-09 (×3): qty 1

## 2020-06-09 MED ORDER — ANASTROZOLE 1 MG PO TABS
1.0000 mg | ORAL_TABLET | Freq: Every day | ORAL | Status: DC
  Administered 2020-06-09: 1 mg via ORAL
  Filled 2020-06-09: qty 1

## 2020-06-09 MED ORDER — PROPOFOL 10 MG/ML IV BOLUS
INTRAVENOUS | Status: AC
  Filled 2020-06-09: qty 20

## 2020-06-09 MED ORDER — 0.9 % SODIUM CHLORIDE (POUR BTL) OPTIME
TOPICAL | Status: DC | PRN
  Administered 2020-06-09: 1000 mL

## 2020-06-09 MED ORDER — VANCOMYCIN HCL IN DEXTROSE 1-5 GM/200ML-% IV SOLN
1000.0000 mg | INTRAVENOUS | Status: AC
  Administered 2020-06-09: 1000 mg via INTRAVENOUS

## 2020-06-09 MED ORDER — ONDANSETRON HCL 4 MG/2ML IJ SOLN: 4.0000 mg | Freq: Four times a day (QID) | INTRAMUSCULAR | Status: DC | PRN

## 2020-06-09 MED ORDER — MIDAZOLAM HCL 2 MG/2ML IJ SOLN
2.0000 mg | Freq: Once | INTRAMUSCULAR | Status: AC
  Administered 2020-06-09: 2 mg via INTRAVENOUS

## 2020-06-09 MED ORDER — LACTATED RINGERS IV SOLN: INTRAVENOUS | Status: DC

## 2020-06-09 MED ORDER — BUPIVACAINE HCL (PF) 0.5 % IJ SOLN
INTRAMUSCULAR | Status: DC | PRN
  Administered 2020-06-09: 20 mL via PERINEURAL

## 2020-06-09 MED ORDER — PROPOFOL 10 MG/ML IV BOLUS
INTRAVENOUS | Status: DC | PRN
  Administered 2020-06-09: 150 mg via INTRAVENOUS

## 2020-06-09 MED ORDER — ONDANSETRON HCL 4 MG/2ML IJ SOLN: 4.0000 mg | Freq: Once | INTRAMUSCULAR | Status: DC | PRN

## 2020-06-09 MED ORDER — CLONAZEPAM 0.5 MG PO TABS: 1.0000 mg | ORAL_TABLET | Freq: Three times a day (TID) | ORAL | Status: DC | PRN

## 2020-06-09 MED ORDER — OXYCODONE HCL 5 MG/5ML PO SOLN: 5.0000 mg | Freq: Once | ORAL | Status: DC | PRN

## 2020-06-09 MED ORDER — HYDROMORPHONE HCL 1 MG/ML IJ SOLN
0.2500 mg | INTRAMUSCULAR | Status: DC | PRN
  Administered 2020-06-09: 0.25 mg via INTRAVENOUS

## 2020-06-09 MED ORDER — HYDROCODONE-ACETAMINOPHEN 5-325 MG PO TABS
1.0000 | ORAL_TABLET | ORAL | Status: DC | PRN
  Administered 2020-06-09: 1 via ORAL
  Filled 2020-06-09: qty 1

## 2020-06-09 MED ORDER — DEXAMETHASONE SODIUM PHOSPHATE 10 MG/ML IJ SOLN
INTRAMUSCULAR | Status: AC
  Filled 2020-06-09: qty 1

## 2020-06-09 MED ORDER — EPHEDRINE SULFATE-NACL 50-0.9 MG/10ML-% IV SOSY
PREFILLED_SYRINGE | INTRAVENOUS | Status: DC | PRN
  Administered 2020-06-09 (×2): 10 mg via INTRAVENOUS

## 2020-06-09 MED ORDER — OXYCODONE HCL 5 MG PO TABS: 5.0000 mg | ORAL_TABLET | Freq: Once | ORAL | Status: DC | PRN

## 2020-06-09 MED ORDER — ONDANSETRON 4 MG PO TBDP: 4.0000 mg | ORAL_TABLET | Freq: Four times a day (QID) | ORAL | Status: DC | PRN

## 2020-06-09 MED ORDER — SODIUM CHLORIDE 0.9 % IV SOLN: INTRAVENOUS | Status: DC

## 2020-06-09 MED ORDER — FENTANYL CITRATE (PF) 100 MCG/2ML IJ SOLN
INTRAMUSCULAR | Status: AC
  Filled 2020-06-09: qty 2

## 2020-06-09 MED ORDER — PANTOPRAZOLE SODIUM 40 MG IV SOLR
40.0000 mg | Freq: Every day | INTRAVENOUS | Status: DC
  Administered 2020-06-09: 40 mg via INTRAVENOUS
  Filled 2020-06-09: qty 40

## 2020-06-09 MED ORDER — GABAPENTIN 300 MG PO CAPS
ORAL_CAPSULE | ORAL | Status: AC
  Filled 2020-06-09: qty 1

## 2020-06-09 MED ORDER — HEPARIN SODIUM (PORCINE) 5000 UNIT/ML IJ SOLN: 5000.0000 [IU] | Freq: Three times a day (TID) | INTRAMUSCULAR | Status: DC

## 2020-06-09 MED ORDER — HYDROCODONE-ACETAMINOPHEN 5-325 MG PO TABS: 1.0000 | ORAL_TABLET | Freq: Four times a day (QID) | ORAL | 0 refills | Status: DC | PRN

## 2020-06-09 MED ORDER — MORPHINE SULFATE (PF) 4 MG/ML IV SOLN: 1.0000 mg | INTRAVENOUS | Status: DC | PRN

## 2020-06-09 MED ORDER — BUPIVACAINE LIPOSOME 1.3 % IJ SUSP
INTRAMUSCULAR | Status: DC | PRN
  Administered 2020-06-09: 10 mL via PERINEURAL

## 2020-06-09 MED ORDER — LIDOCAINE 2% (20 MG/ML) 5 ML SYRINGE
INTRAMUSCULAR | Status: DC | PRN
  Administered 2020-06-09: 20 mg via INTRAVENOUS

## 2020-06-09 MED ORDER — FENTANYL CITRATE (PF) 100 MCG/2ML IJ SOLN
INTRAMUSCULAR | Status: DC | PRN
  Administered 2020-06-09: 50 ug via INTRAVENOUS
  Administered 2020-06-09 (×2): 25 ug via INTRAVENOUS

## 2020-06-09 MED ORDER — DEXAMETHASONE SODIUM PHOSPHATE 10 MG/ML IJ SOLN
INTRAMUSCULAR | Status: DC | PRN
  Administered 2020-06-09: 10 mg via INTRAVENOUS

## 2020-06-09 MED ORDER — MIDAZOLAM HCL 2 MG/2ML IJ SOLN
INTRAMUSCULAR | Status: AC
  Filled 2020-06-09: qty 2

## 2020-06-09 MED ORDER — CELECOXIB 200 MG PO CAPS
200.0000 mg | ORAL_CAPSULE | ORAL | Status: AC
  Administered 2020-06-09: 200 mg via ORAL

## 2020-06-09 MED ORDER — EPHEDRINE 5 MG/ML INJ
INTRAVENOUS | Status: AC
  Filled 2020-06-09: qty 10

## 2020-06-09 MED ORDER — HYDROMORPHONE HCL 1 MG/ML IJ SOLN
INTRAMUSCULAR | Status: AC
  Filled 2020-06-09: qty 0.5

## 2020-06-09 MED ORDER — CELECOXIB 200 MG PO CAPS
ORAL_CAPSULE | ORAL | Status: AC
  Filled 2020-06-09: qty 1

## 2020-06-09 MED ORDER — ONDANSETRON HCL 4 MG/2ML IJ SOLN
INTRAMUSCULAR | Status: DC | PRN
  Administered 2020-06-09: 4 mg via INTRAVENOUS

## 2020-06-09 MED ORDER — TEMAZEPAM 15 MG PO CAPS
30.0000 mg | ORAL_CAPSULE | Freq: Every evening | ORAL | Status: DC | PRN
  Administered 2020-06-09: 30 mg via ORAL
  Filled 2020-06-09: qty 2

## 2020-06-09 SURGICAL SUPPLY — 52 items
ADH SKN CLS APL DERMABOND .7 (GAUZE/BANDAGES/DRESSINGS) ×1
APL PRP STRL LF DISP 70% ISPRP (MISCELLANEOUS) ×1
APPLIER CLIP 9.375 MED OPEN (MISCELLANEOUS) ×2
APR CLP MED 9.3 20 MLT OPN (MISCELLANEOUS) ×1
BINDER BREAST LRG (GAUZE/BANDAGES/DRESSINGS) ×2
BINDER BREAST XLRG (GAUZE/BANDAGES/DRESSINGS)
BINDER BREAST XXLRG (GAUZE/BANDAGES/DRESSINGS)
BIOPATCH RED 1 DISK 7.0 (GAUZE/BANDAGES/DRESSINGS)
BLADE SURG 10 STRL SS (BLADE) ×2
BLADE SURG 15 STRL LF DISP TIS (BLADE) ×1
BLADE SURG 15 STRL SS (BLADE) ×2
CANISTER SUCT 1200ML W/VALVE (MISCELLANEOUS) ×2
CHLORAPREP W/TINT 26 (MISCELLANEOUS) ×2
COVER BACK TABLE 60X90IN (DRAPES) ×2
COVER MAYO STAND STRL (DRAPES) ×2
COVER PROBE W GEL 5X96 (DRAPES)
COVER WAND RF STERILE (DRAPES)
DECANTER SPIKE VIAL GLASS SM (MISCELLANEOUS)
DERMABOND ADVANCED (GAUZE/BANDAGES/DRESSINGS) ×1
DERMABOND ADVANCED .7 DNX12 (GAUZE/BANDAGES/DRESSINGS) ×1
DRAIN CHANNEL 19F RND (DRAIN) ×2
DRAPE GAMMA PROBE CRDLSS 10X38 (DRAPES) ×2
DRAPE LAPAROSCOPIC ABDOMINAL (DRAPES) ×2
DRAPE UTILITY XL STRL (DRAPES) ×2
DRSG PAD ABDOMINAL 8X10 ST (GAUZE/BANDAGES/DRESSINGS) ×2
ELECT COATED BLADE 2.86 ST (ELECTRODE)
ELECT REM PT RETURN 9FT ADLT (ELECTROSURGICAL) ×2
EVACUATOR SILICONE 100CC (DRAIN) ×2
GAUZE SPONGE 4X4 12PLY STRL LF (GAUZE/BANDAGES/DRESSINGS) ×2
GLOVE BIO SURGEON STRL SZ7.5 (GLOVE) ×2
GOWN STRL REUS W/ TWL LRG LVL3 (GOWN DISPOSABLE) ×2
GOWN STRL REUS W/TWL LRG LVL3 (GOWN DISPOSABLE) ×4
ILLUMINATOR WAVEGUIDE N/F (MISCELLANEOUS)
LIGHT WAVEGUIDE WIDE FLAT (MISCELLANEOUS)
NEEDLE HYPO 25X1 1.5 SAFETY (NEEDLE) ×2
NS IRRIG 1000ML POUR BTL (IV SOLUTION) ×2
PACK BASIN DAY SURGERY FS (CUSTOM PROCEDURE TRAY) ×2
PENCIL SMOKE EVACUATOR (MISCELLANEOUS)
PIN SAFETY STERILE (MISCELLANEOUS) ×2
PLASMABLADE 3.0S (MISCELLANEOUS)
PLASMABLADE 3.0S W/LIGHT (MISCELLANEOUS) ×2
SLEEVE SCD COMPRESS KNEE MED (MISCELLANEOUS) ×2
SPONGE LAP 18X18 RF (DISPOSABLE) ×2
SUT ETHILON 2 0 FS 18 (SUTURE) ×2
SUT MNCRL AB 4-0 PS2 18 (SUTURE) ×2
SUT SILK 2 0 SH (SUTURE)
SUT VICRYL 3-0 CR8 SH (SUTURE) ×4
SYR CONTROL 10ML LL (SYRINGE) ×2
TOWEL GREEN STERILE FF (TOWEL DISPOSABLE) ×2
TRAY FOLEY W/BAG SLVR 14FR LF (SET/KITS/TRAYS/PACK)
TUBE CONNECTING 20X1/4 (TUBING) ×2
YANKAUER SUCT BULB TIP NO VENT (SUCTIONS) ×2

## 2020-06-09 NOTE — Progress Notes (Signed)
Nuc med injections completed. Patient tolerated well.   

## 2020-06-09 NOTE — Progress Notes (Signed)
Assisted Dr. Doroteo Glassman with right, ultrasound guided, pectoralis block. Side rails up, monitors on throughout procedure. See vital signs in flow sheet. Tolerated Procedure well.

## 2020-06-09 NOTE — H&P (Signed)
Terri Gay  Location: [facility] Patient #: [id number] DOB: Nov 04, 1950 Married / Language: English / Race: White Female   History of Present Illness  The patient is a 70 year old female who presents with breast cancer.We are asked to see the patient in consultation by Dr. Jana Hakim to evaluate her for a new right breast cancer. the patient is a 70 year old white female who presents with a palpable mass in the upper right breast for the last few years. it was imaged in the past and felt to be a cyst. It seemed larger over the last couple months. Imaging revealed the cyst and a new 1.6cm mass with neg nodes. most of the palpable abnormality was still the cyst. the mass was biopsied and came back as a ILC that was ER and PR+ and Her2- with a Ki67 of 10%. othewise she has htn and does not smoke.   Medication History  Medications Reconciled    Review of Systems  General Not Present- Appetite Loss, Chills, Fatigue, Fever, Night Sweats, Weight Gain and Weight Loss. Note: All other systems negative (unless as noted in HPI & included Review of Systems) Skin Not Present- Change in Wart/Mole, Dryness, Hives, Jaundice, New Lesions, Non-Healing Wounds, Rash and Ulcer. HEENT Not Present- Earache, Hearing Loss, Hoarseness, Nose Bleed, Oral Ulcers, Ringing in the Ears, Seasonal Allergies, Sinus Pain, Sore Throat, Visual Disturbances, Wears glasses/contact lenses and Yellow Eyes. Respiratory Not Present- Bloody sputum, Chronic Cough, Difficulty Breathing, Snoring and Wheezing. Breast Not Present- Breast Mass, Breast Pain, Nipple Discharge and Skin Changes. Cardiovascular Not Present- Chest Pain, Difficulty Breathing Lying Down, Leg Cramps, Palpitations, Rapid Heart Rate, Shortness of Breath and Swelling of Extremities. Gastrointestinal Not Present- Abdominal Pain, Bloating, Bloody Stool, Change in Bowel Habits, Chronic diarrhea, Constipation, Difficulty Swallowing, Excessive gas, Gets full  quickly at meals, Hemorrhoids, Indigestion, Nausea, Rectal Pain and Vomiting. Female Genitourinary Not Present- Frequency, Nocturia, Painful Urination, Pelvic Pain and Urgency. Musculoskeletal Not Present- Back Pain, Joint Pain, Joint Stiffness, Muscle Pain, Muscle Weakness and Swelling of Extremities. Neurological Not Present- Decreased Memory, Fainting, Headaches, Numbness, Seizures, Tingling, Tremor, Trouble walking and Weakness. Psychiatric Not Present- Anxiety, Bipolar, Change in Sleep Pattern, Depression, Fearful and Frequent crying. Endocrine Not Present- Cold Intolerance, Excessive Hunger, Hair Changes, Heat Intolerance, Hot flashes and New Diabetes. Hematology Not Present- Easy Bruising, Excessive bleeding, Gland problems, HIV and Persistent Infections.   Physical Exam  General Mental Status-Alert. General Appearance-Consistent with stated age. Hydration-Well hydrated. Voice-Normal.  Head and Neck Head-normocephalic, atraumatic with no lesions or palpable masses. Trachea-midline. Thyroid Gland Characteristics - normal size and consistency.  Eye Eyeball - Bilateral-Extraocular movements intact. Sclera/Conjunctiva - Bilateral-No scleral icterus.  Chest and Lung Exam Chest and lung exam reveals -quiet, even and easy respiratory effort with no use of accessory muscles and on auscultation, normal breath sounds, no adventitious sounds and normal vocal resonance. Inspection Chest Wall - Normal. Back - normal.  Breast Note: there is a 3-4cm palpable mobile mass in the upper right breast. no other palpable masses. no palpable axillary, supraclavicular, or cervical lymphadenopathy   Cardiovascular Cardiovascular examination reveals -normal heart sounds, regular rate and rhythm with no murmurs and normal pedal pulses bilaterally.  Abdomen Inspection Inspection of the abdomen reveals - No Hernias. Skin - Scar - no surgical  scars. Palpation/Percussion Palpation and Percussion of the abdomen reveal - Soft, Non Tender, No Rebound tenderness, No Rigidity (guarding) and No hepatosplenomegaly. Auscultation Auscultation of the abdomen reveals - Bowel sounds normal.  Neurologic Neurologic evaluation reveals -alert and oriented x 3 with no impairment of recent or remote memory. Mental Status-Normal.  Musculoskeletal Normal Exam - Left-Upper Extremity Strength Normal and Lower Extremity Strength Normal. Normal Exam - Right-Upper Extremity Strength Normal and Lower Extremity Strength Normal.  Lymphatic Head & Neck  General Head & Neck Lymphatics: Bilateral - Description - Normal. Axillary  General Axillary Region: Bilateral - Description - Normal. Tenderness - Non Tender. Femoral & Inguinal  Generalized Femoral & Inguinal Lymphatics: Bilateral - Description - Normal. Tenderness - Non Tender.    Assessment & Plan  MALIGNANT NEOPLASM OF UPPER-INNER QUADRANT OF RIGHT BREAST IN FEMALE, ESTROGEN RECEPTOR POSITIVE (C50.211) Impression: the patient appears to have a 1.6cm lobular cancer in the upper right breast. I have discussed with her the different options for treatment and at this point she favors right mastectomy and sentinel node biopsy. I have discussed with her the risks and benefits of the surgery as well as some of the technical aspects and she understands and wishes to proceed. Because of the lobular nature we will also get an MRI to further evaluate the area. she will also meet with medical and radiation oncology to discuss adjuvant therapy. This patient encounter took 60 minutes today to perform the following: take history, perform exam, review outside records, interpret imaging, counsel the patient on their diagnosis and document encounter, findings & plan in the EHR Current Plans Referred to Oncology, for evaluation and follow up (Oncology). Routine.

## 2020-06-09 NOTE — Transfer of Care (Signed)
Immediate Anesthesia Transfer of Care Note  Patient: Terri Gay  Procedure(s) Performed: RIGHT MASTECTOMY WITH RIGHT AXILLARY SENTINEL LYMPH NODE BIOPSY (Right Breast)  Patient Location: PACU  Anesthesia Type:GA combined with regional for post-op pain  Level of Consciousness: drowsy and patient cooperative  Airway & Oxygen Therapy: Patient Spontanous Breathing and Patient connected to face mask oxygen  Post-op Assessment: Report given to RN and Post -op Vital signs reviewed and stable  Post vital signs: Reviewed and stable  Last Vitals:  Vitals Value Taken Time  BP 113/74 06/09/20 1308  Temp    Pulse 86 06/09/20 1309  Resp 11 06/09/20 1309  SpO2 100 % 06/09/20 1309  Vitals shown include unvalidated device data.  Last Pain:  Vitals:   06/09/20 0955  TempSrc: Oral  PainSc: 0-No pain      Patients Stated Pain Goal: 3 (79/39/03 0092)  Complications: No complications documented.

## 2020-06-09 NOTE — Anesthesia Procedure Notes (Signed)
Procedure Name: LMA Insertion Date/Time: 06/09/2020 11:32 AM Performed by: Genelle Bal, CRNA Pre-anesthesia Checklist: Patient identified, Emergency Drugs available, Suction available and Patient being monitored Patient Re-evaluated:Patient Re-evaluated prior to induction Oxygen Delivery Method: Circle system utilized Preoxygenation: Pre-oxygenation with 100% oxygen Induction Type: IV induction Ventilation: Mask ventilation without difficulty LMA: LMA inserted LMA Size: 4.0 Number of attempts: 1 Airway Equipment and Method: Bite block Placement Confirmation: positive ETCO2 Tube secured with: Tape Dental Injury: Teeth and Oropharynx as per pre-operative assessment

## 2020-06-09 NOTE — Anesthesia Procedure Notes (Signed)
Anesthesia Regional Block: Pectoralis block   Pre-Anesthetic Checklist: ,, timeout performed, Correct Patient, Correct Site, Correct Laterality, Correct Procedure, Correct Position, site marked, Risks and benefits discussed,  Surgical consent,  Pre-op evaluation,  At surgeon's request and post-op pain management  Laterality: Right  Prep: Maximum Sterile Barrier Precautions used, chloraprep       Needles:  Injection technique: Single-shot  Needle Type: Echogenic Stimulator Needle     Needle Length: 9cm  Needle Gauge: 22     Additional Needles:   Procedures:,,,, ultrasound used (permanent image in chart),,,,  Narrative:  Start time: 06/09/2020 10:30 AM End time: 06/09/2020 10:35 AM Injection made incrementally with aspirations every 5 mL.  Performed by: Personally  Anesthesiologist: Pervis Hocking, DO  Additional Notes: Monitors applied. No increased pain on injection. No increased resistance to injection. Injection made in 5cc increments. Good needle visualization. Patient tolerated procedure well.

## 2020-06-09 NOTE — Anesthesia Postprocedure Evaluation (Signed)
Anesthesia Post Note  Patient: Terri Gay  Procedure(s) Performed: RIGHT MASTECTOMY WITH RIGHT AXILLARY SENTINEL LYMPH NODE BIOPSY (Right Breast)     Patient location during evaluation: PACU Anesthesia Type: Regional and General Level of consciousness: awake and alert, oriented and patient cooperative Pain management: pain level controlled Vital Signs Assessment: post-procedure vital signs reviewed and stable Respiratory status: spontaneous breathing, nonlabored ventilation and respiratory function stable Cardiovascular status: blood pressure returned to baseline and stable Postop Assessment: no apparent nausea or vomiting Anesthetic complications: no   No complications documented.  Last Vitals:  Vitals:   06/09/20 1415 06/09/20 1430  BP: 137/90 123/75  Pulse: 95 96  Resp: 16 19  Temp:    SpO2: 94% 96%    Last Pain:  Vitals:   06/09/20 1430  TempSrc:   PainSc: [name]

## 2020-06-09 NOTE — Op Note (Signed)
06/09/2020  12:57 PM  PATIENT:  Terri Gay  70 y.o. female  PRE-OPERATIVE DIAGNOSIS:  RIGHT BREAST CANCER  POST-OPERATIVE DIAGNOSIS:  RIGHT BREAST CANCER  PROCEDURE:  Procedure(s): RIGHT MASTECTOMY WITH DEEP RIGHT AXILLARY SENTINEL LYMPH NODE BIOPSY (Right)  SURGEON:  Surgeon(s) and Role:    * Jovita Kussmaul, MD - Primary  PHYSICIAN ASSISTANT:   ASSISTANTS: none   ANESTHESIA:   general  EBL:  50 mL   BLOOD ADMINISTERED:none  DRAINS: (1) Jackson-Pratt drain(s) with closed bulb suction in the prepectoral space   LOCAL MEDICATIONS USED:  NONE  SPECIMEN:  Source of Specimen:  right mastectomy and sentinel node biopsy  DISPOSITION OF SPECIMEN:  PATHOLOGY  COUNTS:  YES  TOURNIQUET:  * No tourniquets in log *  DICTATION: .Dragon Dictation   After informed consent was obtained the patient was brought to the operating room and placed in the supine position on the operating table.  After adequate induction of general anesthesia the patient's right chest, breast, and axillary area were prepped with ChloraPrep, allowed to dry, and draped in usual sterile manner.  An appropriate timeout was performed.  Earlier in the day the patient underwent injection of 1 mCi of technetium sulfur colloid in the subareolar position on the right.  The neoprobe was set to technetium and good signal was seen in the right axilla.  An elliptical incision was then made with a 10 blade knife around the nipple and areolar complex in order to minimize the excess skin.  The incision was carried through the skin and subcutaneous tissue sharply with the PlasmaBlade.  Breast hooks were used to elevate the skin flaps anteriorly towards the ceiling.  Thin skin flaps were then created circumferentially by dissecting between the breast tissue and the subcutaneous fat.  This dissection was carried all the way to the chest wall circumferentially.  Next the breast was removed from the pectoralis muscle with the pectoralis  fascia.  A couple small perforating vessels medially were controlled with Vicryl stitches.  Once this was accomplished the entire right breast was removed from the patient and marked with a stitch on the lateral skin.  The breast was sent to pathology for further evaluation.  The neoprobe was then used to direct blunt hemostat dissection in the deep right axillary space.  I was able to identify a hot lymph node.  There appeared to be a cluster potentially of 2-3 nodes together.  These were removed en bloc sharply with the PlasmaBlade and the surrounding small vessels and lymphatics were controlled with clips.  Ex vivo counts on this sentinel node were approximately 300.  No other hot or palpable nodes were identified in the right axilla.  Hemostasis was achieved using the PlasmaBlade.  The wound was irrigated with copious amounts of saline.  A small stab incision was made near the anterior axillary line inferior to the operative bed.  A hemostat was placed through this opening and used to bring a 19 [place] round Blake drain into the operative bed.  The drain was curled along the chest wall.  The drain was anchored to the skin with a 2-0 nylon stitch.  Next the superior and inferior skin flaps were grossly reapproximated with interrupted 3-0 Vicryl stitches.  The skin was then closed with a running 4-0 Monocryl subcuticular stitch.  Dermabond dressings and sterile drain dressings were applied.  The patient tolerated the procedure well.  At the end of the case all needle sponge and instrument counts  were correct.  The patient was then awakened and taken to recovery in stable condition.  PLAN OF CARE: Admit for overnight observation  PATIENT DISPOSITION:  PACU - hemodynamically stable.   Delay start of Pharmacological VTE agent (>24hrs) due to surgical blood loss or risk of bleeding: not applicable

## 2020-06-09 NOTE — Interval H&P Note (Signed)
History and Physical Interval Note:  06/09/2020 10:54 AM  Terri Gay  has presented today for surgery, with the diagnosis of RIGHT BREAST CANCER.  The various methods of treatment have been discussed with the patient and family. After consideration of risks, benefits and other options for treatment, the patient has consented to  Procedure(s): RIGHT MASTECTOMY WITH RIGHT AXILLARY SENTINEL LYMPH NODE BIOPSY (Right) as a surgical intervention.  The patient's history has been reviewed, patient examined, no change in status, stable for surgery.  I have reviewed the patient's chart and labs.  Questions were answered to the patient's satisfaction.     Autumn Messing III

## 2020-06-10 ENCOUNTER — Encounter (HOSPITAL_BASED_OUTPATIENT_CLINIC_OR_DEPARTMENT_OTHER): Payer: Self-pay | Admitting: General Surgery

## 2020-06-10 NOTE — Progress Notes (Signed)
1 Day Post-Op   Subjective/Chief Complaint: No complaints   Objective: Vital signs in last 24 hours: Temp:  [97.7 F (36.5 C)-98.1 F (36.7 C)] 97.7 F (36.5 C) (01/20 0600) Pulse Rate:  [64-102] 84 (01/20 0600) Resp:  [9-19] 16 (01/20 0600) BP: (99-137)/(58-90) 127/64 (01/20 0600) SpO2:  [92 %-100 %] 100 % (01/20 0600)    Intake/Output from previous day: 01/19 0701 - 01/20 0700 In: 2640 [P.O.:1140; I.V.:1300; IV Piggyback:200] Out: 1217.5 [Urine:1100; Drains:67.5; Blood:50] Intake/Output this shift: No intake/output data recorded.  General appearance: alert and cooperative Resp: clear to auscultation bilaterally Chest wall: skin flaps look good Cardio: regular rate and rhythm GI: soft, non-tender; bowel sounds normal; no masses,  no organomegaly  Lab Results:  No results for input(s): WBC, HGB, HCT, PLT in the last 72 hours. BMET Recent Labs    06/08/20 0830  NA 141  K 4.0  CL 105  CO2 27  GLUCOSE 102*  BUN 12  CREATININE 0.81  CALCIUM 9.7   PT/INR No results for input(s): LABPROT, INR in the last 72 hours. ABG No results for input(s): PHART, HCO3 in the last 72 hours.  Invalid input(s): PCO2, PO2  Studies/Results: NM Sentinel Node Inj-No Rpt (Breast)  Result Date: 06/09/2020 Sulfur colloid was injected by the nuclear medicine technologist for melanoma sentinel node.    Anti-infectives: Anti-infectives (From admission, onward)   Start     Dose/Rate Route Frequency Ordered Stop   06/09/20 0945  vancomycin (VANCOCIN) IVPB 1000 mg/200 mL premix        1,000 mg 200 mL/hr over 60 Minutes Intravenous On call to O.R. 06/09/20 0935 06/09/20 1234      Assessment/Plan: s/p Procedure(s): RIGHT MASTECTOMY WITH RIGHT AXILLARY SENTINEL LYMPH NODE BIOPSY (Right) Advance diet Discharge  LOS: 0 days    Autumn Messing III 06/10/2020

## 2020-06-10 NOTE — Discharge Instructions (Signed)

## 2020-06-10 NOTE — Discharge Summary (Signed)
Physician Discharge Summary  Patient ID: Terri Gay MRN: [id number] DOB/AGE: 09/14/1950 70 y.o.  Admit date: 06/09/2020 Discharge date: 06/10/2020  Admission Diagnoses:  Discharge Diagnoses:  Active Problems:   Cancer of right female breast [facility])   Discharged Condition: good  Hospital Course: the pt underwent mastectomy. She tolerated surgery well. On pod 1 she was ready for d/c home  Consults: None  Significant Diagnostic Studies: none  Treatments: surgery: as above  Discharge Exam: Blood pressure 127/64, pulse 84, temperature 97.7 F (36.5 C), resp. rate 16, height 5' 1.5" (1.562 m), weight 65 kg, SpO2 100 %. Chest wall: skin flaps look good  Disposition: Discharge disposition: 01-Home or Self Care       Discharge Instructions    Call MD for:  difficulty breathing, headache or visual disturbances   Complete by: As directed    Call MD for:  extreme fatigue   Complete by: As directed    Call MD for:  hives   Complete by: As directed    Call MD for:  persistant dizziness or light-headedness   Complete by: As directed    Call MD for:  persistant nausea and vomiting   Complete by: As directed    Call MD for:  redness, tenderness, or signs of infection (pain, swelling, redness, odor or green/yellow discharge around incision site)   Complete by: As directed    Call MD for:  severe uncontrolled pain   Complete by: As directed    Call MD for:  temperature >100.4   Complete by: As directed    Diet - low sodium heart healthy   Complete by: As directed    Discharge instructions   Complete by: As directed    May shower on Monday. No overhead activity with right arm. Empty drain, record output, recharge bulb daily. Wear binder most of the time for the first couple weeks   Increase activity slowly   Complete by: As directed    No wound care   Complete by: As directed      Allergies as of 06/10/2020      Reactions   Amoxicillin Diarrhea      Medication List     TAKE these medications   acetaminophen 500 MG tablet Commonly known as: TYLENOL Take 500 mg by mouth every 6 (six) hours as needed.   anastrozole 1 MG tablet Commonly known as: ARIMIDEX Take 1 tablet (1 mg total) by mouth daily.   atorvastatin 20 MG tablet Commonly known as: LIPITOR Take 1 tablet (20 mg total) by mouth daily.   bisoprolol 10 MG tablet Commonly known as: ZEBETA Take 1 tablet by mouth once daily   BLACK ELDERBERRY(BERRY-FLOWER) PO Take by mouth.   clonazePAM 1 MG tablet Commonly known as: KLONOPIN Take 1 mg by mouth every 8 (eight) hours as needed. for anxiety   CoQ10 100 MG Caps Take by mouth.   escitalopram 10 MG tablet Commonly known as: LEXAPRO Take 10 mg by mouth daily.   hydrochlorothiazide 12.5 MG capsule Commonly known as: MICROZIDE Take 1 capsule by mouth once daily   HYDROcodone-acetaminophen 5-325 MG tablet Commonly known as: NORCO/VICODIN Take 1-2 tablets by mouth every 6 (six) hours as needed for moderate pain or severe pain.   magnesium gluconate 500 MG tablet Commonly known as: MAGONATE Take 500 mg by mouth daily.   temazepam 30 MG capsule Commonly known as: RESTORIL Take 30 mg by mouth at bedtime as needed.   Vitamin C Plus 1000 MG  Tabs Vitamin C   Vitamin D-3 125 MCG (5000 UT) Tabs Take 5,000 Units by mouth daily.   Zinc 50 MG Caps zinc   ZyrTEC Allergy 10 MG Caps Generic drug: Cetirizine HCl Zyrtec       Follow-up Information    Autumn Messing III, MD In 2 weeks.   Specialty: General Surgery Contact information: [address] [place] [id number] [phone]               Signed: Autumn Messing III 06/10/2020, 10:12 AM

## 2020-06-11 DIAGNOSIS — C50911 Malignant neoplasm of unspecified site of right female breast: Secondary | ICD-10-CM | POA: Diagnosis not present

## 2020-06-14 LAB — SURGICAL PATHOLOGY

## 2020-06-21 ENCOUNTER — Telehealth: Payer: Self-pay | Admitting: *Deleted

## 2020-06-21 ENCOUNTER — Encounter: Payer: Self-pay | Admitting: *Deleted

## 2020-06-21 NOTE — Telephone Encounter (Signed)
Ordered oncotype per Dr. Magrinat.  Faxed requisition to pathology. 

## 2020-06-29 DIAGNOSIS — C50411 Malignant neoplasm of upper-outer quadrant of right female breast: Secondary | ICD-10-CM | POA: Diagnosis not present

## 2020-06-29 DIAGNOSIS — Z170 Estrogen receptor positive status [ER+]: Secondary | ICD-10-CM | POA: Diagnosis not present

## 2020-07-01 ENCOUNTER — Telehealth: Payer: Self-pay | Admitting: *Deleted

## 2020-07-01 ENCOUNTER — Encounter: Payer: Self-pay | Admitting: *Deleted

## 2020-07-01 DIAGNOSIS — C50411 Malignant neoplasm of upper-outer quadrant of right female breast: Secondary | ICD-10-CM

## 2020-07-01 DIAGNOSIS — Z170 Estrogen receptor positive status [ER+]: Secondary | ICD-10-CM

## 2020-07-01 NOTE — Telephone Encounter (Signed)
Received oncotype results of 10/3%.  Patient is aware. Team notified.  Referral placed for Dr. Sondra Come.

## 2020-07-06 ENCOUNTER — Other Ambulatory Visit: Payer: Self-pay | Admitting: Radiation Oncology

## 2020-07-06 ENCOUNTER — Ambulatory Visit
Admission: RE | Admit: 2020-07-06 | Discharge: 2020-07-06 | Disposition: A | Discharge status: Home / Self Care | Payer: Self-pay | Source: Ambulatory Visit | Attending: Radiation Oncology | Admitting: Radiation Oncology

## 2020-07-06 ENCOUNTER — Encounter: Payer: Self-pay | Admitting: *Deleted

## 2020-07-06 ENCOUNTER — Inpatient Hospital Stay
Admission: RE | Admit: 2020-07-06 | Discharge: 2020-07-06 | Disposition: A | Discharge status: Home / Self Care | Payer: Self-pay | Source: Ambulatory Visit | Attending: Radiation Oncology | Admitting: Radiation Oncology

## 2020-07-06 DIAGNOSIS — C50411 Malignant neoplasm of upper-outer quadrant of right female breast: Secondary | ICD-10-CM

## 2020-07-06 DIAGNOSIS — Z170 Estrogen receptor positive status [ER+]: Secondary | ICD-10-CM

## 2020-07-06 NOTE — Progress Notes (Signed)
Location of Breast Cancer: Right Breast  Histology per Pathology Report: 03/09/2021 needle biopsy    11/11/2021MRI guided needle biopsy  06/09/2020 Resection   Receptor Status: ER(positive), PR (positive), Her2-neu (negative), Ki-67(10%)  Did patient present with symptoms (if so, please note symptoms) or was this found on screening mammography?: Found right breast lump that was told was a cyst and re scanned 1 year later which resulted in investigation  Past/Anticipated interventions by surgeon, if any: 06/09/2020 Procedure: RIGHT MASTECTOMY WITH RIGHT AXILLARY SENTINEL LYMPH NODE BIOPSY;  Surgeon: Jovita Kussmaul, MD;  Location: [place];  Service: General;  Laterality: Right;  Past/Anticipated interventions by medical oncology, if any: see Dr. Jana Hakim anastrozole  Lymphedema issues, if any:  no  Pain issues, if any:  occasional pain at post surgical site from removing drain  SAFETY ISSUES:  Prior radiation? no  Pacemaker/ICD? no  Possible current pregnancy? no  Is the patient on methotrexate? no  Current Complaints / other details:  Lives at home with her husband and 3 dogs.

## 2020-07-11 NOTE — Progress Notes (Addendum)
Radiation Oncology         [phone] ________________________________  Name: Terri Gay MRN: [id number]  Date: 07/12/2020  DOB: 10-20-1950  Re-Evaluation Note  CC: Dorothyann Peng, NP  Magrinat, Virgie Dad, MD    ICD-10-CM   1. Malignant neoplasm of upper-outer quadrant of right breast in female, estrogen receptor positive ([place])  C50.411    Z17.0     Diagnosis:  Stage pT3, pN0 (i+), Mx, Right Breast UOQ, Invasive Lobular Carcinoma with LCIS, ER+ / PR+ / Her2-, Grade 2  Narrative:  The patient returns today to discuss radiation treatment options. She was seen in the multidisciplinary breast clinic on 03/17/2020, at which time it was recommended that she proceed with MRI, right lumpectomy with sentinel lymph node biopsy, oncotype DX, adjuvant radiation therapy, and aromatase inhibitor.  MRI of bilateral breasts on 03/20/2020 showed a regional non-mass enhancement involving the upper inner quadrant of the right breast that spanned approximately 6.3 cm. The tissue marker clip from the biopsy-proven invasive lobular carcinoma was at the superior aspect of that non-mass enhancement. Additionally, there were multiple benign bilateral breast cysts. There was no MRI evidence of malignancy involving the left breast and there was no pathologic lymphadenopathy.  MRI-guided core need biopsy of the right breast on 04/01/2020 showed invasive lobular carcinoma at the 12 o'clock region, posterior, and invasive lobular carcinoma with lobular neoplasia (atypical lobular hyperplasia) of the right anterior retroareolar area.  Of note, the patient's surgery was delayed, so Dr. Jana Hakim went ahead and started her on Anastrozole neoadjuvantly.   Genetic testing was performed on 04/28/2020 and was negative.  The patient opted to proceed with a right mastectomy with deep right axillary sentinel lymph node biopsy on 06/09/2020. Pathology from the procedure revealed grade 2 invasive lobular carcinoma that spanned  6.5 cm. Margins of resection were not involved; the closest being 5 mm, deep. One intraparenchymal lymph node was biopsied and was negative for carcinoma. Six right axillary sentinel lymph nodes were biopsied, all of which were negative for carcinoma.  On review of systems, the patient reports overall feeling well.  She has some discomfort along the right chest wall area but no pain she had her JP drain pulled approximately 2 weeks ago.  She has had some mild drainage from this area and wears a bandage in light of this issue.. She denies chills or fever and denies any problems with swelling along her right arm or hand.  She reports good range of movement in the right arm and shoulder.   Allergies:  is allergic to amoxicillin.  Meds: Current Outpatient Medications  Medication Sig Dispense Refill   acetaminophen (TYLENOL) 500 MG tablet Take 500 mg by mouth every 6 (six) hours as needed.     anastrozole (ARIMIDEX) 1 MG tablet Take 1 tablet (1 mg total) by mouth daily. 90 tablet 6   atorvastatin (LIPITOR) 20 MG tablet Take 1 tablet (20 mg total) by mouth daily. 90 tablet 3   Bioflavonoid Products (VITAMIN C PLUS) 1000 MG TABS Vitamin C     bisoprolol (ZEBETA) 10 MG tablet Take 1 tablet by mouth once daily 90 tablet 2   BLACK ELDERBERRY,BERRY-FLOWER, PO Take by mouth.     Cholecalciferol (VITAMIN D-3) 5000 UNITS TABS Take 5,000 Units by mouth daily.     clonazePAM (KLONOPIN) 1 MG tablet Take 1 mg by mouth every 8 (eight) hours as needed. for anxiety     Coenzyme Q10 (COQ10) 100 MG CAPS Take by mouth.  escitalopram (LEXAPRO) 10 MG tablet Take 10 mg by mouth daily.     hydrochlorothiazide (MICROZIDE) 12.5 MG capsule Take 1 capsule by mouth once daily 90 capsule 2   magnesium gluconate (MAGONATE) 500 MG tablet Take 500 mg by mouth daily.     temazepam (RESTORIL) 30 MG capsule Take 30 mg by mouth at bedtime as needed.     Zinc 50 MG CAPS zinc     Cetirizine HCl (ZYRTEC ALLERGY) 10 MG  CAPS Zyrtec (Patient not taking: Reported on 07/12/2020)     HYDROcodone-acetaminophen (NORCO/VICODIN) 5-325 MG tablet Take 1-2 tablets by mouth every 6 (six) hours as needed for moderate pain or severe pain. (Patient not taking: Reported on 07/12/2020) 15 tablet 0   No current facility-administered medications for this encounter.    Physical Findings: The patient is in no acute distress. Patient is alert and oriented.  height is _0  (1.575 m) and weight is 143 lb (64.9 kg). Her temperature is 97.6 F (36.4 C). Her blood pressure is 150/78 (abnormal) and her pulse is 67. Her respiration is 18 and oxygen saturation is 99%.  No significant changes. Lungs are clear to auscultation bilaterally. Heart has regular rate and rhythm. No palpable cervical, supraclavicular, or axillary adenopathy. Abdomen soft, non-tender, normal bowel sounds. Left breast: no palpable mass, nipple discharge or bleeding. Right chest wall area shows a mastectomy scar.  No signs of drainage or infection.  Bandage over lateral drain site which was removed and shows no signs of infection.  Lab Findings: Lab Results  Component Value Date   WBC 8.7 03/17/2020   HGB 11.8 (L) 03/17/2020   HCT 36.3 03/17/2020   MCV 89.9 03/17/2020   PLT 211 03/17/2020    Radiographic Findings: No results found.  Impression: Stage pT3, pN0 (i+), Mx, Right Breast UOQ, Invasive Lobular Carcinoma with LCIS, ER+ / PR+ / Her2-, Grade 2  As above the patient was found significantly larger tumor after MRI.  In light of this she proceeded with a mastectomy and upon pathologic review the patient's tumor was well over 5 cm.  In addition one of the lymph node showed isolated tumor cells.  Based on the large breast tumor she would be at risk for chest wall recurrence and would recommend postmastectomy radiation therapy to reduce the chances of recurrence along this area.  I also recommended elective coverage of the axillary area given her large tumor  size and she is agreeable to this recommendation.  I discussed the overall course of treatment side effects and potential toxicities of radiation therapy with the patient.  She appears to understand and wishes to proceed with radiation therapy as planned  Plan:  Patient is scheduled for CT simulation later today.  Treatments to begin approximately a week from now.  She will receive 6 weeks of radiation therapy.  Total time spent in this encounter was 35 minutes  minutes which included reviewing the patient's most recent breast MRI, MRI-guided core needle biopsy, right mastectomy, pathology reports, genetic testing, physical examination, and documentation.  -----------------------------------  Blair Promise, PhD, MD  This document serves as a record of services personally performed by Gery Pray, MD. It was created on his behalf by Clerance Lav, a trained medical scribe. The creation of this record is based on the scribe's personal observations and the provider's statements to them. This document has been checked and approved by the attending provider.

## 2020-07-12 ENCOUNTER — Ambulatory Visit
Admission: RE | Admit: 2020-07-12 | Discharge: 2020-07-12 | Disposition: A | Discharge status: Home / Self Care | Payer: BC Managed Care – PPO | Source: Ambulatory Visit | Attending: Radiation Oncology | Admitting: Radiation Oncology

## 2020-07-12 ENCOUNTER — Ambulatory Visit
Admission: RE | Admit: 2020-07-12 | Payer: BC Managed Care – PPO | Source: Ambulatory Visit | Admitting: Radiation Oncology

## 2020-07-12 ENCOUNTER — Encounter: Payer: Self-pay | Admitting: Radiation Oncology

## 2020-07-12 ENCOUNTER — Other Ambulatory Visit: Payer: Self-pay

## 2020-07-12 DIAGNOSIS — C50411 Malignant neoplasm of upper-outer quadrant of right female breast: Secondary | ICD-10-CM | POA: Insufficient documentation

## 2020-07-12 DIAGNOSIS — N6002 Solitary cyst of left breast: Secondary | ICD-10-CM | POA: Insufficient documentation

## 2020-07-12 DIAGNOSIS — Z170 Estrogen receptor positive status [ER+]: Secondary | ICD-10-CM | POA: Insufficient documentation

## 2020-07-12 DIAGNOSIS — Z79899 Other long term (current) drug therapy: Secondary | ICD-10-CM | POA: Diagnosis not present

## 2020-07-12 DIAGNOSIS — N6001 Solitary cyst of right breast: Secondary | ICD-10-CM | POA: Diagnosis not present

## 2020-07-12 DIAGNOSIS — Z9011 Acquired absence of right breast and nipple: Secondary | ICD-10-CM | POA: Insufficient documentation

## 2020-07-12 DIAGNOSIS — Z510 Encounter for antineoplastic radiation therapy: Secondary | ICD-10-CM | POA: Diagnosis present

## 2020-07-12 NOTE — Progress Notes (Signed)
See md note for nursing evalutation

## 2020-07-13 ENCOUNTER — Encounter (HOSPITAL_COMMUNITY): Payer: Self-pay

## 2020-07-16 ENCOUNTER — Encounter: Payer: Self-pay | Admitting: Oncology

## 2020-07-19 ENCOUNTER — Encounter: Payer: Self-pay | Admitting: *Deleted

## 2020-07-19 DIAGNOSIS — Z510 Encounter for antineoplastic radiation therapy: Secondary | ICD-10-CM | POA: Diagnosis not present

## 2020-07-19 DIAGNOSIS — Z170 Estrogen receptor positive status [ER+]: Secondary | ICD-10-CM | POA: Diagnosis not present

## 2020-07-19 DIAGNOSIS — C50411 Malignant neoplasm of upper-outer quadrant of right female breast: Secondary | ICD-10-CM | POA: Diagnosis not present

## 2020-07-20 ENCOUNTER — Ambulatory Visit: Payer: BC Managed Care – PPO | Admitting: Radiation Oncology

## 2020-07-21 ENCOUNTER — Other Ambulatory Visit: Payer: Self-pay

## 2020-07-21 ENCOUNTER — Ambulatory Visit
Admission: RE | Admit: 2020-07-21 | Discharge: 2020-07-21 | Disposition: A | Discharge status: Home / Self Care | Payer: BC Managed Care – PPO | Source: Ambulatory Visit | Attending: Radiation Oncology | Admitting: Radiation Oncology

## 2020-07-21 DIAGNOSIS — C50411 Malignant neoplasm of upper-outer quadrant of right female breast: Secondary | ICD-10-CM | POA: Diagnosis present

## 2020-07-21 DIAGNOSIS — Z170 Estrogen receptor positive status [ER+]: Secondary | ICD-10-CM

## 2020-07-22 ENCOUNTER — Ambulatory Visit
Admission: RE | Admit: 2020-07-22 | Discharge: 2020-07-22 | Disposition: A | Discharge status: Home / Self Care | Payer: BC Managed Care – PPO | Source: Ambulatory Visit | Attending: Radiation Oncology | Admitting: Radiation Oncology

## 2020-07-22 DIAGNOSIS — C50411 Malignant neoplasm of upper-outer quadrant of right female breast: Secondary | ICD-10-CM | POA: Diagnosis not present

## 2020-07-22 DIAGNOSIS — Z170 Estrogen receptor positive status [ER+]: Secondary | ICD-10-CM | POA: Diagnosis not present

## 2020-07-23 ENCOUNTER — Ambulatory Visit
Admission: RE | Admit: 2020-07-23 | Discharge: 2020-07-23 | Disposition: A | Discharge status: Home / Self Care | Payer: BC Managed Care – PPO | Source: Ambulatory Visit | Attending: Radiation Oncology | Admitting: Radiation Oncology

## 2020-07-23 ENCOUNTER — Other Ambulatory Visit: Payer: Self-pay

## 2020-07-23 DIAGNOSIS — Z170 Estrogen receptor positive status [ER+]: Secondary | ICD-10-CM | POA: Diagnosis not present

## 2020-07-23 DIAGNOSIS — C50411 Malignant neoplasm of upper-outer quadrant of right female breast: Secondary | ICD-10-CM | POA: Diagnosis not present

## 2020-07-26 ENCOUNTER — Ambulatory Visit
Admission: RE | Admit: 2020-07-26 | Discharge: 2020-07-26 | Disposition: A | Discharge status: Home / Self Care | Payer: BC Managed Care – PPO | Source: Ambulatory Visit | Attending: Radiation Oncology | Admitting: Radiation Oncology

## 2020-07-26 DIAGNOSIS — Z170 Estrogen receptor positive status [ER+]: Secondary | ICD-10-CM | POA: Diagnosis not present

## 2020-07-26 DIAGNOSIS — C50411 Malignant neoplasm of upper-outer quadrant of right female breast: Secondary | ICD-10-CM | POA: Diagnosis not present

## 2020-07-27 ENCOUNTER — Ambulatory Visit
Admission: RE | Admit: 2020-07-27 | Discharge: 2020-07-27 | Disposition: A | Discharge status: Home / Self Care | Payer: BC Managed Care – PPO | Source: Ambulatory Visit | Attending: Radiation Oncology | Admitting: Radiation Oncology

## 2020-07-27 DIAGNOSIS — Z170 Estrogen receptor positive status [ER+]: Secondary | ICD-10-CM

## 2020-07-27 DIAGNOSIS — C50411 Malignant neoplasm of upper-outer quadrant of right female breast: Secondary | ICD-10-CM | POA: Diagnosis not present

## 2020-07-27 MED ORDER — RADIAPLEXRX EX GEL: Freq: Once | CUTANEOUS | Status: AC

## 2020-07-27 MED ORDER — ALRA NON-METALLIC DEODORANT (RAD-ONC)
1.0000 "application " | Freq: Once | TOPICAL | Status: AC
  Administered 2020-07-27: 1 "application " via TOPICAL

## 2020-07-28 ENCOUNTER — Other Ambulatory Visit: Payer: Self-pay

## 2020-07-28 ENCOUNTER — Ambulatory Visit
Admission: RE | Admit: 2020-07-28 | Discharge: 2020-07-28 | Disposition: A | Discharge status: Home / Self Care | Payer: BC Managed Care – PPO | Source: Ambulatory Visit | Attending: Radiation Oncology | Admitting: Radiation Oncology

## 2020-07-28 DIAGNOSIS — C50411 Malignant neoplasm of upper-outer quadrant of right female breast: Secondary | ICD-10-CM | POA: Diagnosis not present

## 2020-07-28 DIAGNOSIS — Z170 Estrogen receptor positive status [ER+]: Secondary | ICD-10-CM | POA: Diagnosis not present

## 2020-07-29 ENCOUNTER — Ambulatory Visit
Admission: RE | Admit: 2020-07-29 | Discharge: 2020-07-29 | Disposition: A | Discharge status: Home / Self Care | Payer: BC Managed Care – PPO | Source: Ambulatory Visit | Attending: Radiation Oncology | Admitting: Radiation Oncology

## 2020-07-29 DIAGNOSIS — Z170 Estrogen receptor positive status [ER+]: Secondary | ICD-10-CM | POA: Diagnosis not present

## 2020-07-29 DIAGNOSIS — C50411 Malignant neoplasm of upper-outer quadrant of right female breast: Secondary | ICD-10-CM | POA: Diagnosis not present

## 2020-07-30 ENCOUNTER — Ambulatory Visit
Admission: RE | Admit: 2020-07-30 | Discharge: 2020-07-30 | Disposition: A | Discharge status: Home / Self Care | Payer: BC Managed Care – PPO | Source: Ambulatory Visit | Attending: Radiation Oncology | Admitting: Radiation Oncology

## 2020-07-30 ENCOUNTER — Other Ambulatory Visit: Payer: Self-pay

## 2020-07-30 DIAGNOSIS — C50411 Malignant neoplasm of upper-outer quadrant of right female breast: Secondary | ICD-10-CM | POA: Diagnosis not present

## 2020-07-30 DIAGNOSIS — Z170 Estrogen receptor positive status [ER+]: Secondary | ICD-10-CM | POA: Diagnosis not present

## 2020-08-02 ENCOUNTER — Ambulatory Visit
Admission: RE | Admit: 2020-08-02 | Discharge: 2020-08-02 | Disposition: A | Discharge status: Home / Self Care | Payer: BC Managed Care – PPO | Source: Ambulatory Visit | Attending: Radiation Oncology | Admitting: Radiation Oncology

## 2020-08-02 ENCOUNTER — Ambulatory Visit: Payer: BC Managed Care – PPO | Attending: General Surgery | Admitting: Physical Therapy

## 2020-08-02 ENCOUNTER — Other Ambulatory Visit: Payer: Self-pay

## 2020-08-02 ENCOUNTER — Encounter: Payer: Self-pay | Admitting: Physical Therapy

## 2020-08-02 DIAGNOSIS — Z483 Aftercare following surgery for neoplasm: Secondary | ICD-10-CM | POA: Diagnosis present

## 2020-08-02 DIAGNOSIS — C50411 Malignant neoplasm of upper-outer quadrant of right female breast: Secondary | ICD-10-CM | POA: Insufficient documentation

## 2020-08-02 DIAGNOSIS — Z170 Estrogen receptor positive status [ER+]: Secondary | ICD-10-CM | POA: Diagnosis present

## 2020-08-02 DIAGNOSIS — R293 Abnormal posture: Secondary | ICD-10-CM | POA: Diagnosis not present

## 2020-08-02 NOTE — Patient Instructions (Signed)
Closed Chain: Shoulder Abduction / Adduction - on Wall    One hand on wall, step to side and return. Stepping causes shoulder to abduct and adduct. Step _5__ times, holding 5 seconds, _2__ times per day.  http://ss.exer.us/267   Copyright  VHI. All rights reserved.  Closed Chain: Shoulder Flexion / Extension - on Wall    Hands on wall, step backward. Return. Stepping causes shoulder flexion and extension Do _5__ times, holding 5 seconds, _2__ times per day.  http://ss.exer.us/265   Copyright  VHI. All rights reserved.               [facility]         [address], [place] [address]         [phone]         Annia Friendly, PT, CLT   After Breast Cancer Class It is recommended you attend the ABC class to be educated on lymphedema risk reduction. This class is free of charge and lasts for 1 hour. It is a 1-time class.  I will give you the written information since you'd prefer that and you can call me with questions.  Scar massage You can begin gentle scar massage to your incision several minutes each day like we discussed today.   Home exercise Program Continue the exercises until you've completed radiation and don't feel tightness at the end of the exercise.   Follow up PT: It is recommended you return every 3 months for the first 3 years following surgery to be assessed on the SOZO machine for an L-Dex score. This helps prevent clinically significant lymphedema in 95% of patients. These follow up screens are 15 minute appointments that you are not billed for. You are scheduled for April 11th at 10:30 here at [address].

## 2020-08-02 NOTE — Therapy (Signed)
[place], [place], [address] Phone: [phone]   Fax:  [phone]  Physical Therapy Treatment  Patient Details  Name: Terri Gay MRN: [id number] Date of Birth: 06/23/1950 Referring Provider (PT): Dr. Autumn Messing   Encounter Date: 08/02/2020   PT End of Session - 08/02/20 1156    Visit Number 2    Number of Visits 2    PT Start Time 1100    PT Stop Time 1156    PT Time Calculation (min) 56 min    Activity Tolerance Patient tolerated treatment well    Behavior During Therapy [facility] for tasks assessed/performed           Past Medical History:  Diagnosis Date  . Allergy    seasonal  . Anxiety   . Arthritis   . Cancer [facility]) 2021   right breast IDC with DCIS  . Depression   . Diverticulosis 10/07/2010   Colonoscopy  . GERD (gastroesophageal reflux disease)    OTC  . Hemorrhoids   . History of chicken pox   . Hyperlipidemia   . Hypertension   . Knee pain, left 10/24/2013  . Osteopenia 04/25/2013  . Osteoporosis   . Palpitations 04/26/2013  . Preventative health care 04/26/2013    Past Surgical History:  Procedure Laterality Date  . MASTECTOMY W/ SENTINEL NODE BIOPSY Right 06/09/2020   Procedure: RIGHT MASTECTOMY WITH RIGHT AXILLARY SENTINEL LYMPH NODE BIOPSY;  Surgeon: Jovita Kussmaul, MD;  Location: [place];  Service: General;  Laterality: Right;  . NO PAST SURGERIES      There were no vitals filed for this visit.   Subjective Assessment - 08/02/20 1055    Subjective Patient reports she underwent a right mastectomy and sentinel node biopsy (1/6 positive axillary lymph nodes) on 06/09/2020. Drains were removed about 3 weeks post op. She had her 9th radiation treatment today. She will undergo anti-estrogen therapy.    Pertinent History Patient was diagnosed on 03/01/2020 with right grade II invasive lobular carcinoma breast cancer. She underwent a right mastectomy and sentinel  node biopsy (1/6 positive axillary lymph nodes) on 06/09/2020. It is ER/PR positive and HER2 negative with a Ki67 of 10%.    Patient Stated Goals See how my arm is doing    Currently in Pain? No/denies              [facility] PT Assessment - 08/02/20 0001      Assessment   Medical Diagnosis s/p right lumpectomy and ALNB    Referring Provider (PT) Dr. Autumn Messing    Onset Date/Surgical Date 06/09/20    Hand Dominance Right    Prior Therapy Baselines      Precautions   Precautions Other (comment)    Precaution Comments recent surgery; right arm lymphedema risk      Restrictions   Weight Bearing Restrictions No      Balance Screen   Has the patient fallen in the past 6 months No    Has the patient had a decrease in activity level because of a fear of falling?  No    Is the patient reluctant to leave their home because of a fear of falling?  No      Home Environment   Living Environment Private residence    Living Arrangements Spouse/significant other    Available Help at Discharge Family      Prior Function   Level of Independence Independent  Vocation Retired    Leisure She is riding her stationary bike 30 min 5x/week      Cognition   Overall Cognitive Status Within Functional Limits for tasks assessed      Observation/Other Assessments   Observations Right chest incision appears to be well healed. Scar tissue tightness present. No redness or edema present.      Posture/Postural Control   Posture/Postural Control Postural limitations    Postural Limitations Rounded Shoulders;Forward head      ROM / Strength   AROM / PROM / Strength AROM      AROM   AROM Assessment Site Shoulder    Right/Left Shoulder Right    Right Shoulder Extension 56 Degrees    Right Shoulder Flexion 137 Degrees    Right Shoulder ABduction 158 Degrees    Right Shoulder Internal Rotation 61 Degrees    Right Shoulder External Rotation 72 Degrees      Strength   Overall Strength Within  functional limits for tasks performed             LYMPHEDEMA/ONCOLOGY QUESTIONNAIRE - 08/02/20 0001      Type   Cancer Type Right breast cancer      Surgeries   Mastectomy Date 06/09/20    Sentinel Lymph Node Biopsy Date 06/09/20    Number Lymph Nodes Removed 6      Treatment   Active Chemotherapy Treatment No    Past Chemotherapy Treatment No    Active Radiation Treatment Yes    Date 07/21/20   Scheduled for 30 visits   Body Site right breast and axilla    Past Radiation Treatment No    Current Hormone Treatment No    Past Hormone Therapy No      What other symptoms do you have   Are you Having Heaviness or Tightness No    Are you having Pain No    Are you having pitting edema No    Is it Hard or Difficult finding clothes that fit No    Do you have infections No    Is there Decreased scar mobility No    [place]emmer Sign No      Lymphedema Assessments   Lymphedema Assessments Upper extremities      Right Upper Extremity Lymphedema   10 cm Proximal to Olecranon Process 27.4 cm    Olecranon Process 23.4 cm    10 cm Proximal to Ulnar Styloid Process 20.2 cm    Just Proximal to Ulnar Styloid Process 14.4 cm    Across Hand at [facility] 17.8 cm    At [place] of 2nd Digit 5.9 cm      Left Upper Extremity Lymphedema   10 cm Proximal to Olecranon Process 27.4 cm    Olecranon Process 23.6 cm    10 cm Proximal to Ulnar Styloid Process 20.3 cm    Just Proximal to Ulnar Styloid Process 14.3 cm    Across Hand at [facility] 17.9 cm    At [place] of 2nd Digit 5.9 cm              Quick Dash - 08/02/20 0001    Open a tight or new jar Mild difficulty    Do heavy household chores (wash walls, wash floors) No difficulty    Carry a shopping bag or briefcase No difficulty    Wash your back No difficulty    Use a knife to cut food No difficulty    Recreational activities  in which you take some force or impact through your arm, shoulder, or hand (golf, hammering, tennis) No  difficulty    During the past week, to what extent has your arm, shoulder or hand problem interfered with your normal social activities with family, friends, neighbors, or groups? Not at all    During the past week, to what extent has your arm, shoulder or hand problem limited your work or other regular daily activities Not at all    Arm, shoulder, or hand pain. None    Tingling (pins and needles) in your arm, shoulder, or hand None    Difficulty Sleeping No difficulty    DASH Score 2.27 %                          PT Education - 08/02/20 1155    Education Details Lymphedema risk reduction; scar massage; aftercare    Person(s) Educated Patient;Spouse    Methods Explanation;Demonstration;Handout    Comprehension Returned demonstration;Verbalized understanding               PT Long Term Goals - 08/02/20 1204      PT LONG TERM GOAL #1   Title Patient will demonstrate she has regained full shoulder ROM and function post operatively compared ot baselines.    Time 8    Period Weeks    Status Achieved                 Plan - 08/02/20 1156    Clinical Impression Statement Patient is doing well s/p right mastectomy and SLNB 06/09/2020 with 1 axillary lymph node with isolated tumor cells of 6 removed. She is currently undergoing radiation to the right chest and axilla and has completed 9 treatments so far. She will then undergo anti-estrogen therapy. She has nearly regained shoulder ROM and function and shows no signs of lymphedema. She was given lymphedema risk reduction information as she declined attending the After Breast Cancer class. Otherwise she has no physical therapy needs.    PT Treatment/Interventions ADLs/Self Care Home Management;Therapeutic exercise;Patient/family education    PT Next Visit Plan D/C    PT Home Exercise Plan Post op shoulder ROM HEP    Consulted and Agree with Plan of Care Patient           Patient will benefit from skilled  therapeutic intervention in order to improve the following deficits and impairments:  Postural dysfunction,Decreased range of motion,Decreased knowledge of precautions,Impaired UE functional use,Pain  Visit Diagnosis: Malignant neoplasm of upper-outer quadrant of right breast in female, estrogen receptor positive ([place])  Abnormal posture  Aftercare following surgery for neoplasm     Problem List Patient Active Problem List   Diagnosis Date Noted  . Cancer of right female breast ([place]) 06/09/2020  . Genetic testing 05/17/2020  . Malignant neoplasm of upper-outer quadrant of right breast in female, estrogen receptor positive ([place]) 03/11/2020  . Knee pain, left 10/24/2013  . Palpitations 04/26/2013  . Preventative health care 04/26/2013  . Osteopenia 04/25/2013  . Acute esophagitis 09/19/2012  . GERD (gastroesophageal reflux disease) 03/03/2012  . Essential hypertension 08/23/2011  . Rectal bleeding 06/23/2011  . Hyperlipidemia 03/02/2011  . Depression with anxiety 01/26/2011  . Insomnia 01/26/2011  . Diverticulosis 01/17/2011  . Hemorrhoids 01/17/2011  . Osteoarthritis 01/17/2011   PHYSICAL THERAPY DISCHARGE SUMMARY  Visits from Start of Care: 2  Current functional level related to goals / functional outcomes: Goals met. See above for objective  findings.   Remaining deficits: None except tight scar tissue   Education / Equipment: Lymphedema risk reduction  Plan: Patient agrees to discharge.  Patient goals were met. Patient is being discharged due to meeting the stated rehab goals.  ?????        Annia Friendly, [place] 08/02/20 12:06 PM  [place], [place], [address] Phone: [phone]   Fax:  [phone]  Name: Terri Gay MRN: [id number] Date of Birth: January 10, 1951

## 2020-08-03 ENCOUNTER — Ambulatory Visit
Admission: RE | Admit: 2020-08-03 | Discharge: 2020-08-03 | Disposition: A | Discharge status: Home / Self Care | Payer: BC Managed Care – PPO | Source: Ambulatory Visit | Attending: Radiation Oncology | Admitting: Radiation Oncology

## 2020-08-03 DIAGNOSIS — C50411 Malignant neoplasm of upper-outer quadrant of right female breast: Secondary | ICD-10-CM | POA: Diagnosis not present

## 2020-08-03 DIAGNOSIS — Z170 Estrogen receptor positive status [ER+]: Secondary | ICD-10-CM | POA: Diagnosis not present

## 2020-08-04 ENCOUNTER — Ambulatory Visit
Admission: RE | Admit: 2020-08-04 | Discharge: 2020-08-04 | Disposition: A | Discharge status: Home / Self Care | Payer: BC Managed Care – PPO | Source: Ambulatory Visit | Attending: Radiation Oncology | Admitting: Radiation Oncology

## 2020-08-04 ENCOUNTER — Ambulatory Visit: Payer: BC Managed Care – PPO | Admitting: Physical Therapy

## 2020-08-04 DIAGNOSIS — C50411 Malignant neoplasm of upper-outer quadrant of right female breast: Secondary | ICD-10-CM | POA: Diagnosis not present

## 2020-08-04 DIAGNOSIS — Z170 Estrogen receptor positive status [ER+]: Secondary | ICD-10-CM | POA: Diagnosis not present

## 2020-08-05 ENCOUNTER — Other Ambulatory Visit: Payer: Self-pay

## 2020-08-05 ENCOUNTER — Ambulatory Visit
Admission: RE | Admit: 2020-08-05 | Discharge: 2020-08-05 | Disposition: A | Discharge status: Home / Self Care | Payer: BC Managed Care – PPO | Source: Ambulatory Visit | Attending: Radiation Oncology | Admitting: Radiation Oncology

## 2020-08-05 DIAGNOSIS — Z170 Estrogen receptor positive status [ER+]: Secondary | ICD-10-CM | POA: Diagnosis not present

## 2020-08-05 DIAGNOSIS — C50411 Malignant neoplasm of upper-outer quadrant of right female breast: Secondary | ICD-10-CM | POA: Diagnosis not present

## 2020-08-06 ENCOUNTER — Ambulatory Visit
Admission: RE | Admit: 2020-08-06 | Discharge: 2020-08-06 | Disposition: A | Discharge status: Home / Self Care | Payer: BC Managed Care – PPO | Source: Ambulatory Visit | Attending: Radiation Oncology | Admitting: Radiation Oncology

## 2020-08-06 DIAGNOSIS — C50411 Malignant neoplasm of upper-outer quadrant of right female breast: Secondary | ICD-10-CM | POA: Diagnosis not present

## 2020-08-06 DIAGNOSIS — Z170 Estrogen receptor positive status [ER+]: Secondary | ICD-10-CM | POA: Diagnosis not present

## 2020-08-09 ENCOUNTER — Other Ambulatory Visit: Payer: Self-pay

## 2020-08-09 ENCOUNTER — Ambulatory Visit
Admission: RE | Admit: 2020-08-09 | Discharge: 2020-08-09 | Disposition: A | Discharge status: Home / Self Care | Payer: BC Managed Care – PPO | Source: Ambulatory Visit | Attending: Radiation Oncology | Admitting: Radiation Oncology

## 2020-08-09 DIAGNOSIS — Z170 Estrogen receptor positive status [ER+]: Secondary | ICD-10-CM | POA: Diagnosis not present

## 2020-08-09 DIAGNOSIS — C50411 Malignant neoplasm of upper-outer quadrant of right female breast: Secondary | ICD-10-CM | POA: Diagnosis not present

## 2020-08-10 ENCOUNTER — Ambulatory Visit
Admission: RE | Admit: 2020-08-10 | Discharge: 2020-08-10 | Disposition: A | Discharge status: Home / Self Care | Payer: BC Managed Care – PPO | Source: Ambulatory Visit | Attending: Radiation Oncology | Admitting: Radiation Oncology

## 2020-08-10 DIAGNOSIS — C50411 Malignant neoplasm of upper-outer quadrant of right female breast: Secondary | ICD-10-CM | POA: Diagnosis not present

## 2020-08-10 DIAGNOSIS — Z170 Estrogen receptor positive status [ER+]: Secondary | ICD-10-CM | POA: Diagnosis not present

## 2020-08-10 MED ORDER — RADIAPLEXRX EX GEL: Freq: Once | CUTANEOUS | Status: AC

## 2020-08-11 ENCOUNTER — Ambulatory Visit
Admission: RE | Admit: 2020-08-11 | Discharge: 2020-08-11 | Disposition: A | Discharge status: Home / Self Care | Payer: BC Managed Care – PPO | Source: Ambulatory Visit | Attending: Radiation Oncology | Admitting: Radiation Oncology

## 2020-08-11 ENCOUNTER — Other Ambulatory Visit: Payer: Self-pay

## 2020-08-11 DIAGNOSIS — C50411 Malignant neoplasm of upper-outer quadrant of right female breast: Secondary | ICD-10-CM | POA: Diagnosis not present

## 2020-08-11 DIAGNOSIS — Z170 Estrogen receptor positive status [ER+]: Secondary | ICD-10-CM | POA: Diagnosis not present

## 2020-08-12 ENCOUNTER — Ambulatory Visit: Payer: BC Managed Care – PPO

## 2020-08-13 ENCOUNTER — Ambulatory Visit
Admission: RE | Admit: 2020-08-13 | Discharge: 2020-08-13 | Disposition: A | Discharge status: Home / Self Care | Payer: BC Managed Care – PPO | Source: Ambulatory Visit | Attending: Radiation Oncology | Admitting: Radiation Oncology

## 2020-08-13 ENCOUNTER — Other Ambulatory Visit: Payer: Self-pay

## 2020-08-13 DIAGNOSIS — Z170 Estrogen receptor positive status [ER+]: Secondary | ICD-10-CM | POA: Diagnosis not present

## 2020-08-13 DIAGNOSIS — C50411 Malignant neoplasm of upper-outer quadrant of right female breast: Secondary | ICD-10-CM | POA: Diagnosis not present

## 2020-08-16 ENCOUNTER — Ambulatory Visit
Admission: RE | Admit: 2020-08-16 | Discharge: 2020-08-16 | Disposition: A | Discharge status: Home / Self Care | Payer: BC Managed Care – PPO | Source: Ambulatory Visit | Attending: Radiation Oncology | Admitting: Radiation Oncology

## 2020-08-16 ENCOUNTER — Other Ambulatory Visit: Payer: Self-pay

## 2020-08-16 DIAGNOSIS — Z170 Estrogen receptor positive status [ER+]: Secondary | ICD-10-CM | POA: Diagnosis not present

## 2020-08-16 DIAGNOSIS — C50411 Malignant neoplasm of upper-outer quadrant of right female breast: Secondary | ICD-10-CM | POA: Diagnosis not present

## 2020-08-17 ENCOUNTER — Ambulatory Visit
Admission: RE | Admit: 2020-08-17 | Discharge: 2020-08-17 | Disposition: A | Discharge status: Home / Self Care | Payer: BC Managed Care – PPO | Source: Ambulatory Visit | Attending: Radiation Oncology | Admitting: Radiation Oncology

## 2020-08-17 ENCOUNTER — Ambulatory Visit: Payer: BC Managed Care – PPO | Admitting: Radiation Oncology

## 2020-08-17 ENCOUNTER — Other Ambulatory Visit: Payer: Self-pay

## 2020-08-17 DIAGNOSIS — Z170 Estrogen receptor positive status [ER+]: Secondary | ICD-10-CM | POA: Diagnosis not present

## 2020-08-17 DIAGNOSIS — C50411 Malignant neoplasm of upper-outer quadrant of right female breast: Secondary | ICD-10-CM | POA: Diagnosis not present

## 2020-08-17 MED ORDER — SONAFINE EX EMUL
1.0000 "application " | Freq: Two times a day (BID) | CUTANEOUS | Status: DC
  Administered 2020-08-17: 1 "application " via TOPICAL

## 2020-08-18 ENCOUNTER — Other Ambulatory Visit: Payer: Self-pay

## 2020-08-18 ENCOUNTER — Ambulatory Visit
Admission: RE | Admit: 2020-08-18 | Discharge: 2020-08-18 | Disposition: A | Discharge status: Home / Self Care | Payer: BC Managed Care – PPO | Source: Ambulatory Visit | Attending: Radiation Oncology | Admitting: Radiation Oncology

## 2020-08-18 DIAGNOSIS — Z170 Estrogen receptor positive status [ER+]: Secondary | ICD-10-CM | POA: Diagnosis not present

## 2020-08-18 DIAGNOSIS — C50411 Malignant neoplasm of upper-outer quadrant of right female breast: Secondary | ICD-10-CM | POA: Diagnosis not present

## 2020-08-19 ENCOUNTER — Ambulatory Visit
Admission: RE | Admit: 2020-08-19 | Discharge: 2020-08-19 | Disposition: A | Discharge status: Home / Self Care | Payer: BC Managed Care – PPO | Source: Ambulatory Visit | Attending: Radiation Oncology | Admitting: Radiation Oncology

## 2020-08-19 DIAGNOSIS — Z170 Estrogen receptor positive status [ER+]: Secondary | ICD-10-CM | POA: Diagnosis not present

## 2020-08-19 DIAGNOSIS — C50411 Malignant neoplasm of upper-outer quadrant of right female breast: Secondary | ICD-10-CM | POA: Diagnosis not present

## 2020-08-20 ENCOUNTER — Ambulatory Visit
Admission: RE | Admit: 2020-08-20 | Discharge: 2020-08-20 | Disposition: A | Discharge status: Home / Self Care | Payer: BC Managed Care – PPO | Source: Ambulatory Visit | Attending: Radiation Oncology | Admitting: Radiation Oncology

## 2020-08-20 ENCOUNTER — Other Ambulatory Visit: Payer: Self-pay

## 2020-08-20 DIAGNOSIS — C50411 Malignant neoplasm of upper-outer quadrant of right female breast: Secondary | ICD-10-CM | POA: Insufficient documentation

## 2020-08-20 DIAGNOSIS — Z170 Estrogen receptor positive status [ER+]: Secondary | ICD-10-CM | POA: Diagnosis present

## 2020-08-23 ENCOUNTER — Other Ambulatory Visit: Payer: Self-pay

## 2020-08-23 ENCOUNTER — Ambulatory Visit: Payer: BC Managed Care – PPO

## 2020-08-23 ENCOUNTER — Ambulatory Visit
Admission: RE | Admit: 2020-08-23 | Discharge: 2020-08-23 | Disposition: A | Discharge status: Home / Self Care | Payer: BC Managed Care – PPO | Source: Ambulatory Visit | Attending: Radiation Oncology | Admitting: Radiation Oncology

## 2020-08-23 DIAGNOSIS — C50411 Malignant neoplasm of upper-outer quadrant of right female breast: Secondary | ICD-10-CM | POA: Diagnosis not present

## 2020-08-23 DIAGNOSIS — Z170 Estrogen receptor positive status [ER+]: Secondary | ICD-10-CM | POA: Diagnosis not present

## 2020-08-24 ENCOUNTER — Ambulatory Visit: Payer: BC Managed Care – PPO

## 2020-08-24 ENCOUNTER — Ambulatory Visit: Payer: BC Managed Care – PPO | Admitting: Radiation Oncology

## 2020-08-24 ENCOUNTER — Ambulatory Visit
Admission: RE | Admit: 2020-08-24 | Discharge: 2020-08-24 | Disposition: A | Discharge status: Home / Self Care | Payer: BC Managed Care – PPO | Source: Ambulatory Visit | Attending: Radiation Oncology | Admitting: Radiation Oncology

## 2020-08-24 DIAGNOSIS — Z170 Estrogen receptor positive status [ER+]: Secondary | ICD-10-CM | POA: Diagnosis not present

## 2020-08-24 DIAGNOSIS — C50411 Malignant neoplasm of upper-outer quadrant of right female breast: Secondary | ICD-10-CM | POA: Diagnosis not present

## 2020-08-25 ENCOUNTER — Ambulatory Visit
Admission: RE | Admit: 2020-08-25 | Discharge: 2020-08-25 | Disposition: A | Discharge status: Home / Self Care | Payer: BC Managed Care – PPO | Source: Ambulatory Visit | Attending: Radiation Oncology | Admitting: Radiation Oncology

## 2020-08-25 ENCOUNTER — Ambulatory Visit: Payer: BC Managed Care – PPO

## 2020-08-25 DIAGNOSIS — Z170 Estrogen receptor positive status [ER+]: Secondary | ICD-10-CM | POA: Diagnosis not present

## 2020-08-25 DIAGNOSIS — C50411 Malignant neoplasm of upper-outer quadrant of right female breast: Secondary | ICD-10-CM | POA: Diagnosis not present

## 2020-08-26 ENCOUNTER — Other Ambulatory Visit: Payer: Self-pay

## 2020-08-26 ENCOUNTER — Ambulatory Visit: Payer: BC Managed Care – PPO

## 2020-08-26 ENCOUNTER — Ambulatory Visit
Admission: RE | Admit: 2020-08-26 | Discharge: 2020-08-26 | Disposition: A | Discharge status: Home / Self Care | Payer: BC Managed Care – PPO | Source: Ambulatory Visit | Attending: Radiation Oncology | Admitting: Radiation Oncology

## 2020-08-26 DIAGNOSIS — Z170 Estrogen receptor positive status [ER+]: Secondary | ICD-10-CM | POA: Diagnosis not present

## 2020-08-26 DIAGNOSIS — C50411 Malignant neoplasm of upper-outer quadrant of right female breast: Secondary | ICD-10-CM | POA: Diagnosis not present

## 2020-08-27 ENCOUNTER — Other Ambulatory Visit: Payer: Self-pay

## 2020-08-27 ENCOUNTER — Ambulatory Visit: Payer: BC Managed Care – PPO

## 2020-08-27 ENCOUNTER — Ambulatory Visit
Admission: RE | Admit: 2020-08-27 | Discharge: 2020-08-27 | Disposition: A | Discharge status: Home / Self Care | Payer: BC Managed Care – PPO | Source: Ambulatory Visit | Attending: Radiation Oncology | Admitting: Radiation Oncology

## 2020-08-27 DIAGNOSIS — C50411 Malignant neoplasm of upper-outer quadrant of right female breast: Secondary | ICD-10-CM | POA: Diagnosis not present

## 2020-08-27 DIAGNOSIS — Z170 Estrogen receptor positive status [ER+]: Secondary | ICD-10-CM | POA: Diagnosis not present

## 2020-08-30 ENCOUNTER — Encounter: Payer: Self-pay | Admitting: *Deleted

## 2020-08-30 ENCOUNTER — Other Ambulatory Visit: Payer: Self-pay

## 2020-08-30 ENCOUNTER — Ambulatory Visit: Payer: BC Managed Care – PPO

## 2020-08-30 ENCOUNTER — Ambulatory Visit: Payer: BC Managed Care – PPO | Attending: General Surgery | Admitting: Physical Therapy

## 2020-08-30 ENCOUNTER — Ambulatory Visit
Admission: RE | Admit: 2020-08-30 | Discharge: 2020-08-30 | Disposition: A | Discharge status: Home / Self Care | Payer: BC Managed Care – PPO | Source: Ambulatory Visit | Attending: Radiation Oncology | Admitting: Radiation Oncology

## 2020-08-30 DIAGNOSIS — Z170 Estrogen receptor positive status [ER+]: Secondary | ICD-10-CM | POA: Insufficient documentation

## 2020-08-30 DIAGNOSIS — R293 Abnormal posture: Secondary | ICD-10-CM

## 2020-08-30 DIAGNOSIS — C50411 Malignant neoplasm of upper-outer quadrant of right female breast: Secondary | ICD-10-CM | POA: Insufficient documentation

## 2020-08-30 DIAGNOSIS — Z483 Aftercare following surgery for neoplasm: Secondary | ICD-10-CM | POA: Insufficient documentation

## 2020-08-30 NOTE — Therapy (Signed)
[place], [place], [address] Phone: [phone]   Fax:  [phone]  Physical Therapy Treatment  Patient Details  Name: Terri Gay MRN: [id number] Date of Birth: 05/16/1951 Referring Provider (PT): Dr. Autumn Messing   Encounter Date: 08/30/2020   PT End of Session - 08/30/20 1051    Visit Number 2    Number of Visits 2    PT Start Time 1219    PT Stop Time 1045    PT Time Calculation (min) 13 min    Activity Tolerance Patient tolerated treatment well    Behavior During Therapy [facility] for tasks assessed/performed           Past Medical History:  Diagnosis Date  . Allergy    seasonal  . Anxiety   . Arthritis   . Cancer [facility]) 2021   right breast IDC with DCIS  . Depression   . Diverticulosis 10/07/2010   Colonoscopy  . GERD (gastroesophageal reflux disease)    OTC  . Hemorrhoids   . History of chicken pox   . Hyperlipidemia   . Hypertension   . Knee pain, left 10/24/2013  . Osteopenia 04/25/2013  . Osteoporosis   . Palpitations 04/26/2013  . Preventative health care 04/26/2013    Past Surgical History:  Procedure Laterality Date  . MASTECTOMY W/ SENTINEL NODE BIOPSY Right 06/09/2020   Procedure: RIGHT MASTECTOMY WITH RIGHT AXILLARY SENTINEL LYMPH NODE BIOPSY;  Surgeon: Jovita Kussmaul, MD;  Location: [place];  Service: General;  Laterality: Right;  . NO PAST SURGERIES      There were no vitals filed for this visit.   Subjective Assessment - 08/30/20 1049    Subjective Pt here for L-Dex screen    Pertinent History Patient was diagnosed on 03/01/2020 with right grade II invasive lobular carcinoma breast cancer. She underwent a right mastectomy and sentinel node biopsy (1/6 positive axillary lymph nodes) on 06/09/2020. It is ER/PR positive and HER2 negative with a Ki67 of 10%.                  L-DEX FLOWSHEETS - 08/30/20 1000      L-DEX LYMPHEDEMA SCREENING    Measurement Type Unilateral    L-DEX MEASUREMENT EXTREMITY Upper Extremity    POSITION  Standing    DOMINANT SIDE Right    At Risk Side Right    BASELINE SCORE (UNILATERAL) -6.1    L-DEX SCORE (UNILATERAL) -7.9    VALUE CHANGE (UNILAT) -1.8                                  PT Long Term Goals - 08/02/20 1204      PT LONG TERM GOAL #1   Title Patient will demonstrate she has regained full shoulder ROM and function post operatively compared ot baselines.    Time 8    Period Weeks    Status Achieved                 Plan - 08/30/20 1051    Clinical Impression Statement Pt with no significant change in L-Dex score.    PT Next Visit Plan Continue L-Dex screens    Consulted and Agree with Plan of Care Patient           Patient will benefit from skilled therapeutic intervention in order to improve the following deficits and impairments:  Visit Diagnosis: Malignant neoplasm of upper-outer quadrant of right breast in female, estrogen receptor positive ([place])  Abnormal posture  Aftercare following surgery for neoplasm     Problem List Patient Active Problem List   Diagnosis Date Noted  . Cancer of right female breast ([place]) 06/09/2020  . Genetic testing 05/17/2020  . Malignant neoplasm of upper-outer quadrant of right breast in female, estrogen receptor positive ([place]) 03/11/2020  . Knee pain, left 10/24/2013  . Palpitations 04/26/2013  . Preventative health care 04/26/2013  . Osteopenia 04/25/2013  . Acute esophagitis 09/19/2012  . GERD (gastroesophageal reflux disease) 03/03/2012  . Essential hypertension 08/23/2011  . Rectal bleeding 06/23/2011  . Hyperlipidemia 03/02/2011  . Depression with anxiety 01/26/2011  . Insomnia 01/26/2011  . Diverticulosis 01/17/2011  . Hemorrhoids 01/17/2011  . Osteoarthritis 01/17/2011   Annia Friendly, PT 08/30/20 10:52 AM  [place], [place], [address] Phone: [phone]   Fax:  [phone]  Name: JEILANI GRUPE MRN: [id number] Date of Birth: 06-Oct-1950

## 2020-08-31 ENCOUNTER — Other Ambulatory Visit: Payer: Self-pay | Admitting: Radiology

## 2020-08-31 ENCOUNTER — Ambulatory Visit
Admission: RE | Admit: 2020-08-31 | Discharge: 2020-08-31 | Disposition: A | Discharge status: Home / Self Care | Payer: BC Managed Care – PPO | Source: Ambulatory Visit | Attending: Radiation Oncology | Admitting: Radiation Oncology

## 2020-08-31 ENCOUNTER — Ambulatory Visit: Payer: BC Managed Care – PPO

## 2020-08-31 DIAGNOSIS — C50411 Malignant neoplasm of upper-outer quadrant of right female breast: Secondary | ICD-10-CM

## 2020-08-31 DIAGNOSIS — Z170 Estrogen receptor positive status [ER+]: Secondary | ICD-10-CM | POA: Diagnosis not present

## 2020-08-31 MED ORDER — SONAFINE EX EMUL: 1.0000 "application " | Freq: Once | CUTANEOUS | Status: AC

## 2020-08-31 MED ORDER — SONAFINE EX EMUL: 1.0000 "application " | Freq: Once | CUTANEOUS | Status: DC

## 2020-08-31 MED ORDER — SONAFINE EX EMUL
1.0000 "application " | Freq: Once | CUTANEOUS | Status: AC
  Administered 2020-08-31: 1 "application " via TOPICAL

## 2020-09-01 ENCOUNTER — Encounter: Payer: Self-pay | Admitting: Radiation Oncology

## 2020-09-01 ENCOUNTER — Ambulatory Visit
Admission: RE | Admit: 2020-09-01 | Discharge: 2020-09-01 | Disposition: A | Discharge status: Home / Self Care | Payer: BC Managed Care – PPO | Source: Ambulatory Visit | Attending: Radiation Oncology | Admitting: Radiation Oncology

## 2020-09-01 ENCOUNTER — Other Ambulatory Visit: Payer: Self-pay

## 2020-09-01 DIAGNOSIS — C50411 Malignant neoplasm of upper-outer quadrant of right female breast: Secondary | ICD-10-CM | POA: Diagnosis not present

## 2020-09-01 DIAGNOSIS — Z170 Estrogen receptor positive status [ER+]: Secondary | ICD-10-CM | POA: Diagnosis not present

## 2020-09-03 ENCOUNTER — Ambulatory Visit: Payer: BC Managed Care – PPO

## 2020-09-13 ENCOUNTER — Other Ambulatory Visit: Payer: Self-pay

## 2020-09-14 ENCOUNTER — Encounter: Payer: Self-pay | Admitting: Adult Health

## 2020-09-14 ENCOUNTER — Ambulatory Visit (INDEPENDENT_AMBULATORY_CARE_PROVIDER_SITE_OTHER): Payer: BC Managed Care – PPO | Admitting: Adult Health

## 2020-09-14 VITALS — BP 138/82 | HR 70 | Temp 98.1°F | Wt 145.4 lb

## 2020-09-14 DIAGNOSIS — C50911 Malignant neoplasm of unspecified site of right female breast: Secondary | ICD-10-CM

## 2020-09-14 DIAGNOSIS — E785 Hyperlipidemia, unspecified: Secondary | ICD-10-CM | POA: Diagnosis not present

## 2020-09-14 DIAGNOSIS — Z170 Estrogen receptor positive status [ER+]: Secondary | ICD-10-CM

## 2020-09-14 DIAGNOSIS — Z0000 Encounter for general adult medical examination without abnormal findings: Secondary | ICD-10-CM

## 2020-09-14 DIAGNOSIS — I10 Essential (primary) hypertension: Secondary | ICD-10-CM

## 2020-09-14 DIAGNOSIS — F418 Other specified anxiety disorders: Secondary | ICD-10-CM | POA: Diagnosis not present

## 2020-09-14 DIAGNOSIS — G4701 Insomnia due to medical condition: Secondary | ICD-10-CM

## 2020-09-14 LAB — CBC WITH DIFFERENTIAL/PLATELET
Basophils Absolute: 0.1 10*3/uL (ref 0.0–0.1)
Basophils Relative: 0.8 % (ref 0.0–3.0)
Eosinophils Absolute: 0.2 10*3/uL (ref 0.0–0.7)
Eosinophils Relative: 2.4 % (ref 0.0–5.0)
HCT: 37 % (ref 36.0–46.0)
Hemoglobin: 12.4 g/dL (ref 12.0–15.0)
Lymphocytes Relative: 10.2 % — ABNORMAL LOW (ref 12.0–46.0)
Lymphs Abs: 0.8 10*3/uL (ref 0.7–4.0)
MCHC: 33.5 g/dL (ref 30.0–36.0)
MCV: 85.5 fl (ref 78.0–100.0)
Monocytes Absolute: 0.4 10*3/uL (ref 0.1–1.0)
Monocytes Relative: 5.3 % (ref 3.0–12.0)
Neutro Abs: 6.6 10*3/uL (ref 1.4–7.7)
Neutrophils Relative %: 81.3 % — ABNORMAL HIGH (ref 43.0–77.0)
Platelets: 203 10*3/uL (ref 150.0–400.0)
RBC: 4.32 Mil/uL (ref 3.87–5.11)
RDW: 14 % (ref 11.5–15.5)
WBC: 8.2 10*3/uL (ref 4.0–10.5)

## 2020-09-14 LAB — COMPREHENSIVE METABOLIC PANEL WITH GFR
ALT: 14 U/L (ref 0–35)
AST: 17 U/L (ref 0–37)
Albumin: 4.2 g/dL (ref 3.5–5.2)
Alkaline Phosphatase: 73 U/L (ref 39–117)
BUN: 14 mg/dL (ref 6–23)
CO2: 29 meq/L (ref 19–32)
Calcium: 9.8 mg/dL (ref 8.4–10.5)
Chloride: 103 meq/L (ref 96–112)
Creatinine, Ser: 0.75 mg/dL (ref 0.40–1.20)
GFR: 81.93 mL/min
Glucose, Bld: 92 mg/dL (ref 70–99)
Potassium: 3.8 meq/L (ref 3.5–5.1)
Sodium: 141 meq/L (ref 135–145)
Total Bilirubin: 0.5 mg/dL (ref 0.2–1.2)
Total Protein: 7.1 g/dL (ref 6.0–8.3)

## 2020-09-14 LAB — LIPID PANEL
Cholesterol: 155 mg/dL (ref 0–200)
HDL: 52.3 mg/dL
LDL Cholesterol: 89 mg/dL (ref 0–99)
NonHDL: 102.66
Total CHOL/HDL Ratio: 3
Triglycerides: 66 mg/dL (ref 0.0–149.0)
VLDL: 13.2 mg/dL (ref 0.0–40.0)

## 2020-09-14 LAB — TSH: TSH: 0.72 u[IU]/mL (ref 0.35–4.50)

## 2020-09-14 LAB — HEMOGLOBIN A1C: Hgb A1c MFr Bld: 5.9 % (ref 4.6–6.5)

## 2020-09-14 MED ORDER — BISOPROLOL FUMARATE 10 MG PO TABS: 10.0000 mg | ORAL_TABLET | Freq: Every day | ORAL | 3 refills | Status: DC

## 2020-09-14 MED ORDER — ATORVASTATIN CALCIUM 10 MG PO TABS: 10.0000 mg | ORAL_TABLET | Freq: Every day | ORAL | 3 refills | Status: DC

## 2020-09-14 MED ORDER — HYDROCHLOROTHIAZIDE 12.5 MG PO CAPS: 12.5000 mg | ORAL_CAPSULE | Freq: Every day | ORAL | 3 refills | Status: DC

## 2020-09-14 NOTE — Progress Notes (Signed)
Subjective:    Patient ID: Terri Gay, female    DOB: 12/23/1950, 70 y.o.   MRN: [id number]  HPI Patient presents for yearly preventative medicine examination. She is a pleasant 70 year old female who  has a past medical history of Allergy, Anxiety, Arthritis, Cancer ([place]) (2021), Depression, Diverticulosis (10/07/2010), GERD (gastroesophageal reflux disease), Hemorrhoids, History of chicken pox, Hyperlipidemia, Hypertension, Knee pain, left (10/24/2013), Osteopenia (04/25/2013), Osteoporosis, Palpitations (04/26/2013), and Preventative health care (04/26/2013).  Essential Hypertension -controlled with Zebeta 10 mg daily and hydrochlorothiazide 12.5 mg daily.  She denies chest pain, shortness of breath, dizziness, lightheadedness, headaches, or syncopal episodes BP Readings from Last 3 Encounters:  07/12/20 (!) 150/78  06/10/20 127/64  04/02/20 133/79   Anxiety/Depression -takes Klonopin 0.5 mg, 1 tablet in the morning, and 2 tablets at bedtime as needed. Also prescribed Lexapro 10 mg and Restoril 30 mg by Oncology   Hyperlipidemia-takes Lipitor 20 mg every other day.  She denies myalgia or fatigue. She has having a hard time remembering to take this every other day and would like to go to 10 mg daily dosing.  Lab Results  Component Value Date   CHOL 159 09/11/2019   HDL 45.90 09/11/2019   LDLCALC 95 09/11/2019   TRIG 93.0 09/11/2019   CHOLHDL 3 09/11/2019   H/o of Breast Cancer -was found to have less cancer in October 2021.  She underwent right mastectomy with right axillary sentinel lymph node biopsy in January 2022 and recently completed 30 rounds of radiation therapy. Taking Armadex 1 mg daily.   Immunizations and health maintenance protocols were reviewed with the patient and needed orders were placed.  Appropriate screening laboratory values were ordered for the patient including screening of hyperlipidemia, renal function and hepatic function.  Medication reconciliation,  past  medical history, social history, problem list and allergies were reviewed in detail with the patient  Goals were established with regard to weight loss, exercise, and  diet in compliance with medications  She is followed by GYN on a routine basis    Review of Systems  Constitutional: Negative.   HENT: Negative.   Eyes: Negative.   Respiratory: Negative.   Cardiovascular: Negative.   Gastrointestinal: Negative.   Endocrine: Negative.   Genitourinary: Negative.   Musculoskeletal: Negative.   Skin: Positive for rash (rash on right flank from radiation therapy ).  Allergic/Immunologic: Negative.   Neurological: Negative.   Hematological: Negative.   Psychiatric/Behavioral: Negative.    Past Medical History:  Diagnosis Date  . Allergy    seasonal  . Anxiety   . Arthritis   . Cancer [facility]) 2021   right breast IDC with DCIS  . Depression   . Diverticulosis 10/07/2010   Colonoscopy  . GERD (gastroesophageal reflux disease)    OTC  . Hemorrhoids   . History of chicken pox   . Hyperlipidemia   . Hypertension   . Knee pain, left 10/24/2013  . Osteopenia 04/25/2013  . Osteoporosis   . Palpitations 04/26/2013  . Preventative health care 04/26/2013    Social History   Socioeconomic History  . Marital status: Married    Spouse name: Not on file  . Number of children: 2  . Years of education: Not on file  . Highest education level: Not on file  Occupational History  . Occupation: Research scientist (physical sciences): [employer]    Comment: retired  Tobacco Use  . Smoking status: Never Smoker  . Smokeless tobacco: Never Used  Substance and Sexual Activity  . Alcohol use: Yes    Alcohol/week: 1.0 standard drink    Types: 1 Glasses of wine per week    Comment: occasionally  . Drug use: No  . Sexual activity: Yes    Birth control/protection: Post-menopausal  Other Topics Concern  . Not on file  Social History Narrative   Retired from [employer]    Married  for 27 years    Have one son and one daughter ( live in [place])       She likes to dance, read, and play with her dogs.          Caffeine Use: 1 cup coffee and 1 cup tea      Social Determinants of Health   Financial Resource Strain: Not on file  Food Insecurity: Not on file  Transportation Needs: Not on file  Physical Activity: Not on file  Stress: Not on file  Social Connections: Not on file  Intimate Partner Violence: Not on file    Past Surgical History:  Procedure Laterality Date  . MASTECTOMY W/ SENTINEL NODE BIOPSY Right 06/09/2020   Procedure: RIGHT MASTECTOMY WITH RIGHT AXILLARY SENTINEL LYMPH NODE BIOPSY;  Surgeon: Jovita Kussmaul, MD;  Location: [facility];  Service: General;  Laterality: Right;  . NO PAST SURGERIES      Family History  Problem Relation Age of Onset  . Lung cancer Father        d. 61  . Lung cancer Paternal Grandfather        smoker  . Diabetes Mother   . Arthritis Mother   . Hyperlipidemia Mother   . Hypertension Mother   . Heart disease Mother        CHF  . Diabetes Maternal Grandmother   . Heart disease Maternal Grandmother   . Arthritis Maternal Grandmother   . Hyperlipidemia Maternal Grandmother   . Hypertension Maternal Grandmother   . Hyperlipidemia Sister   . Arthritis Paternal Grandmother   . Heart disease Daughter        arrythmia, Internal Cardiac Defibrillator  . Lupus Son   . Drug abuse Son   . Colon cancer Neg Hx   . Esophageal cancer Neg Hx   . Rectal cancer Neg Hx   . Stomach cancer Neg Hx     Allergies  Allergen Reactions  . Amoxicillin Diarrhea    Current Outpatient Medications on File Prior to Visit  Medication Sig Dispense Refill  . acetaminophen (TYLENOL) 500 MG tablet Take 500 mg by mouth every 6 (six) hours as needed.    Marland Kitchen anastrozole (ARIMIDEX) 1 MG tablet Take 1 tablet (1 mg total) by mouth daily. 90 tablet 6  . atorvastatin (LIPITOR) 20 MG tablet Take 1 tablet (20 mg total) by mouth daily.  90 tablet 3  . Bioflavonoid Products (VITAMIN C PLUS) 1000 MG TABS Vitamin C    . bisoprolol (ZEBETA) 10 MG tablet Take 1 tablet by mouth once daily 90 tablet 2  . BLACK ELDERBERRY,BERRY-FLOWER, PO Take by mouth.    . Cetirizine HCl (ZYRTEC ALLERGY) 10 MG CAPS Zyrtec (Patient not taking: Reported on 07/12/2020)    . Cholecalciferol (VITAMIN D-3) 5000 UNITS TABS Take 5,000 Units by mouth daily.    . clonazePAM (KLONOPIN) 1 MG tablet Take 1 mg by mouth every 8 (eight) hours as needed. for anxiety    . Coenzyme Q10 (COQ10) 100 MG CAPS Take by mouth.    . escitalopram (LEXAPRO)  10 MG tablet Take 10 mg by mouth daily.    . hydrochlorothiazide (MICROZIDE) 12.5 MG capsule Take 1 capsule by mouth once daily 90 capsule 2  . HYDROcodone-acetaminophen (NORCO/VICODIN) 5-325 MG tablet Take 1-2 tablets by mouth every 6 (six) hours as needed for moderate pain or severe pain. (Patient not taking: Reported on 07/12/2020) 15 tablet 0  . magnesium gluconate (MAGONATE) 500 MG tablet Take 500 mg by mouth daily.    . temazepam (RESTORIL) 30 MG capsule Take 30 mg by mouth at bedtime as needed.    . Zinc 50 MG CAPS zinc     Current Facility-Administered Medications on File Prior to Visit  Medication Dose Route Frequency Provider Last Rate Last Admin  . Sonafine emulsion 1 application  1 application Topical Once Gery Pray, MD        There were no vitals taken for this visit.      Objective:   Physical Exam Vitals and nursing note reviewed.  Constitutional:      General: She is not in acute distress.    Appearance: Normal appearance. She is well-developed. She is not ill-appearing.  HENT:     Head: Normocephalic and atraumatic.     Right Ear: Tympanic membrane, ear canal and external ear normal. There is no impacted cerumen.     Left Ear: Tympanic membrane, ear canal and external ear normal. There is no impacted cerumen.     Nose: Nose normal. No congestion or rhinorrhea.     Mouth/Throat:     Mouth:  Mucous membranes are moist.     Pharynx: Oropharynx is clear. No oropharyngeal exudate or posterior oropharyngeal erythema.  Eyes:     General:        Right eye: No discharge.        Left eye: No discharge.     Extraocular Movements: Extraocular movements intact.     Conjunctiva/sclera: Conjunctivae normal.     Pupils: Pupils are equal, round, and reactive to light.  Neck:     Thyroid: No thyromegaly.     Vascular: No carotid bruit.     Trachea: No tracheal deviation.  Cardiovascular:     Rate and Rhythm: Normal rate and regular rhythm.     Pulses: Normal pulses.     Heart sounds: Normal heart sounds. No murmur heard. No friction rub. No gallop.   Pulmonary:     Effort: Pulmonary effort is normal. No respiratory distress.     Breath sounds: Normal breath sounds. No stridor. No wheezing, rhonchi or rales.  Chest:     Chest wall: No tenderness.  Abdominal:     General: Abdomen is flat. Bowel sounds are normal. There is no distension.     Palpations: Abdomen is soft. There is no mass.     Tenderness: There is no abdominal tenderness. There is no right CVA tenderness, left CVA tenderness, guarding or rebound.     Hernia: No hernia is present.  Musculoskeletal:        General: No swelling, tenderness, deformity or signs of injury. Normal range of motion.     Cervical back: Normal range of motion and neck supple.     Right lower leg: No edema.     Left lower leg: No edema.  Lymphadenopathy:     Cervical: No cervical adenopathy.  Skin:    General: Skin is warm and dry.     Coloration: Skin is not jaundiced or pale.     Findings: No bruising, erythema,  lesion or rash.  Neurological:     General: No focal deficit present.     Mental Status: She is alert and oriented to person, place, and time.     Cranial Nerves: No cranial nerve deficit.     Sensory: No sensory deficit.     Motor: No weakness.     Coordination: Coordination normal.     Gait: Gait normal.     Deep Tendon  Reflexes: Reflexes normal.  Psychiatric:        Mood and Affect: Mood normal.        Behavior: Behavior normal.        Thought Content: Thought content normal.        Judgment: Judgment normal.       Assessment & Plan:  1. Routine general medical examination at a health care facility - Follow up in one year or sooner if needed - CBC with Differential/Platelet; Future - Comprehensive metabolic panel; Future - Lipid panel; Future - TSH; Future - Hemoglobin A1c; Future - Hemoglobin A1c - TSH - Lipid panel - Comprehensive metabolic panel - CBC with Differential/Platelet  2. Hyperlipidemia, unspecified hyperlipidemia type - Will change in lipitor 10 mg daily  - CBC with Differential/Platelet; Future - Comprehensive metabolic panel; Future - Lipid panel; Future - TSH; Future - Hemoglobin A1c; Future - Hemoglobin A1c - TSH - Lipid panel - Comprehensive metabolic panel - CBC with Differential/Platelet  3. Essential hypertension - Well controlled. No change in medications  - CBC with Differential/Platelet; Future - Comprehensive metabolic panel; Future - Lipid panel; Future - TSH; Future - Hemoglobin A1c; Future - hydrochlorothiazide (MICROZIDE) 12.5 MG capsule; Take 1 capsule (12.5 mg total) by mouth daily.  Dispense: 90 capsule; Refill: 3 - bisoprolol (ZEBETA) 10 MG tablet; Take 1 tablet (10 mg total) by mouth daily.  Dispense: 90 tablet; Refill: 3 - Hemoglobin A1c - TSH - Lipid panel - Comprehensive metabolic panel - CBC with Differential/Platelet  4. Depression with anxiety - Continue with klonopin and lexapro  - CBC with Differential/Platelet; Future - Comprehensive metabolic panel; Future - Lipid panel; Future - TSH; Future - Hemoglobin A1c; Future - Hemoglobin A1c - TSH - Lipid panel - Comprehensive metabolic panel - CBC with Differential/Platelet  5. Malignant neoplasm of right breast in female, estrogen receptor positive, unspecified site of breast  ([place]) - Follow up with oncology as directed - CBC with Differential/Platelet; Future - Comprehensive metabolic panel; Future - Lipid panel; Future - TSH; Future - Hemoglobin A1c; Future - Hemoglobin A1c - TSH - Lipid panel - Comprehensive metabolic panel - CBC with Differential/Platelet  6. Insomnia due to medical condition - Continue with Restoril  - CBC with Differential/Platelet; Future - Comprehensive metabolic panel; Future - Lipid panel; Future - TSH; Future - Hemoglobin A1c; Future - Hemoglobin A1c - TSH - Lipid panel - Comprehensive metabolic panel - CBC with Differential/Platelet   Dorothyann Peng, NP

## 2020-09-26 NOTE — Progress Notes (Signed)
[facility]  [phone] [phone]     ID: Terri Gay DOB: 06/16/1950  MR#: [id number]  CSN#:[id number]  Patient Care Team: Nafziger, Cory, NP as PCP - General (Family Medicine) Grewal, Michelle, MD (Obstetrics and Gynecology) Stuart, Dawn C, RN as Oncology Nurse Navigator Martini, Keisha N, RN as Oncology Nurse Navigator Toth, Paul III, MD as Consulting Physician (General Surgery) Magrinat, Gustav C, MD as Consulting Physician (Oncology) Kinard, James, MD as Consulting Physician (Radiation Oncology) Gustav C Magrinat, MD OTHER MD:  CHIEF COMPLAINT: Estrogen receptor positive breast cancer  CURRENT TREATMENT: Anastrozole   INTERVAL HISTORY: Yarnell returns today for follow up of her estrogen receptor positive breast cancer. She was evaluated in the multidisciplinary breast cancer clinic on 03/17/2020.  She is accompanied by her husband Tabi  Since consultation, she underwent breast MRI on 03/20/2020 showing: breast composition D; regional non-mass enhancement involving upper-inner right breast spanning 6.3 cm; no evidence of malignancy involving left breast; multiple benign bilateral breast cysts; no pathologic lymphadenopathy.  She underwent additional right breast biopsies on 04/01/2020 to evaluate the extent of her disease. Pathology ([id number]) showed: 1. Right Breast, retroareolar  - invasive lobular carcinoma  -lobular neoplasia 2. Right Breast, 12 o'clock  - invasive lobular carcinoma  She also underwent genetic counseling on 04/28/2020. Results were negative, with the exception of two variants of uncertain significance-- one in EGFR, and one in NTHL1.  She opted to proceed with right mastectomy on 06/09/2020 under Dr. Toth. Pathology from the procedure ([id number]) showed: invasive lobular carcinoma, 6.5 cm, grade 2; resection margins negative.   Out of a total of 7 lymph nodes, none showed macro or micrometastases. One node did show  isolated tumor cells.  Oncotype DX was obtained on the final surgical sample and the recurrence score of 10 predicts a risk of recurrence outside the breast over the next 9 years of 3%, if the patient's only systemic therapy is an antiestrogen for 5 years.  It also predicts no benefit from chemotherapy.   She was referred back to Dr. Kinard on 07/12/2020 to review radiation therapy. She subsequently received treatment from 07/21/2020 through 09/01/2020  Recall she started anastrozole neoadjuvantly in November 2021.  She continues on this and is tolerating it remarkably well, with no significant hot flashes.  She is having vaginal dryness issues which however are not a new problem  REVIEW OF SYSTEMS: Kennah is not yet exercising regularly.  If she swings her arms with walking it rubs in her right axilla and is uncomfortable.  She is just beginning to wear a prosthesis.  She still has a little bit of skin peeling anteriorly on the irradiated side.  Aside from this a detailed review of systems today was noncontributory   COVID 19 VACCINATION STATUS: She has not received the COVID-19 vaccine she says for religious reasons.  "There are things in the vaccine that I do not agree with".     HISTORY OF CURRENT ILLNESS: From the original intake note:  Terri Gay herself palpated an upper-inner right breast lump. She underwent bilateral diagnostic mammography with tomography and bilateral breast ultrasonography at [facility] on 03/01/2020 showing: breast density category D; 1.6 cm palpable right breast mass at 12 o'clock; benign simply cysts in left breast at 9 o'clock, 1 cm and 1.1 cm; benign oval cysts in right breast at 11 o'clock.  At the breast conference this morning it was reported that the axilla was negative by ultrasound  Accordingly on 03/09/2020   she proceeded to biopsy of the right breast area in question. The pathology from this procedure ([id number]) showed: invasive and in situ mammary carcinoma,  e-cadherin negative, grade 2. Prognostic indicators significant for: estrogen receptor, 95% positive and progesterone receptor, 20% positive, both with strong staining intensity. Proliferation marker Ki67 at 10%. HER2 negative by immunohistochemistry (1+).  The patient's subsequent history is as detailed below.   PAST MEDICAL HISTORY: Past Medical History:  Diagnosis Date  . Allergy    seasonal  . Anxiety   . Arthritis   . Cancer (HCC) 2021   right breast IDC with DCIS  . Depression   . Diverticulosis 10/07/2010   Colonoscopy  . GERD (gastroesophageal reflux disease)    OTC  . Hemorrhoids   . History of chicken pox   . History of radiation therapy 08/05/20-09/06/20   Right breast- Dr. James Kinard  . Hyperlipidemia   . Hypertension   . Knee pain, left 10/24/2013  . Osteopenia 04/25/2013  . Osteoporosis   . Palpitations 04/26/2013  . Preventative health care 04/26/2013    PAST SURGICAL HISTORY: Past Surgical History:  Procedure Laterality Date  . MASTECTOMY W/ SENTINEL NODE BIOPSY Right 06/09/2020   Procedure: RIGHT MASTECTOMY WITH RIGHT AXILLARY SENTINEL LYMPH NODE BIOPSY;  Surgeon: Toth, Paul III, MD;  Location: [facility];  Service: General;  Laterality: Right;  . NO PAST SURGERIES      FAMILY HISTORY: Family History  Problem Relation Age of Onset  . Lung cancer Father        d. 66  . Lung cancer Paternal Grandfather        smoker  . Diabetes Mother   . Arthritis Mother   . Hyperlipidemia Mother   . Hypertension Mother   . Heart disease Mother        CHF  . Diabetes Maternal Grandmother   . Heart disease Maternal Grandmother   . Arthritis Maternal Grandmother   . Hyperlipidemia Maternal Grandmother   . Hypertension Maternal Grandmother   . Hyperlipidemia Sister   . Arthritis Paternal Grandmother   . Heart disease Daughter        arrythmia, Internal Cardiac Defibrillator  . Lupus Son   . Drug abuse Son   . Colon cancer Neg Hx   . Esophageal  cancer Neg Hx   . Rectal cancer Neg Hx   . Stomach cancer Neg Hx    Her father died at age 65 from lung cancer. He had a history of tobacco use, as well as asbestos exposure while in the [employer]. Her mother died at age 87 from dementia. Sahasra has one brother and one sister. In addition to her father, she reports lung cancer in her paternal grandfather, who also used tobacco, around age 82.   GYNECOLOGIC HISTORY:  No LMP recorded. Patient is postmenopausal. Menarche: [age] years old Age at first live birth: 70 years old GX P 2 LMP age 54 Contraceptive: used for about 30 years HRT: used for 6 years, stopped following cancer diagnosis  Hysterectomy? no BSO? 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 Husband Tommy works in sales for [employer]. Daughter Amanda Perdue, age 40, works in customer service in [place]. Son Brandon Tilley, age 44, lives in [place], [place].  The patient is not sure what his current occupation is.  Jamiaya attends [employer], as well as [employer] on Tuesdays.      ADVANCED DIRECTIVES: In the absence of any documentation to the contrary, the patient's spouse is their HCPOA.    HEALTH MAINTENANCE: Social History   Tobacco Use  . Smoking status: Never Smoker  . Smokeless tobacco: Never Used  Substance Use Topics  . Alcohol use: Yes    Alcohol/week: 1.0 standard drink    Types: 1 Glasses of wine per week    Comment: occasionally  . Drug use: No     Colonoscopy: 09/2010, Dr. Maurene Capes  PAP: 02/2020  Bone density: 01/2019, osteopenia   Allergies  Allergen Reactions  . Amoxicillin Diarrhea    Current Outpatient Medications  Medication Sig Dispense Refill  . acetaminophen (TYLENOL) 500 MG tablet Take 500 mg by mouth every 6 (six) hours as needed.    Marland Kitchen anastrozole (ARIMIDEX) 1 MG tablet Take 1 tablet (1 mg total) by mouth daily. 90 tablet 6  . atorvastatin (LIPITOR) 10  MG tablet Take 1 tablet (10 mg total) by mouth daily. 90 tablet 3  . atorvastatin (LIPITOR) 20 MG tablet Take 1 tablet (20 mg total) by mouth daily. 90 tablet 3  . Bioflavonoid Products (VITAMIN C PLUS) 1000 MG TABS Vitamin C    . bisoprolol (ZEBETA) 10 MG tablet Take 1 tablet (10 mg total) by mouth daily. 90 tablet 3  . BLACK ELDERBERRY,BERRY-FLOWER, PO Take by mouth.    . Cetirizine HCl (ZYRTEC ALLERGY) 10 MG CAPS     . Cholecalciferol (VITAMIN D-3) 5000 UNITS TABS Take 5,000 Units by mouth daily.    . clonazePAM (KLONOPIN) 1 MG tablet Take 1 mg by mouth every 8 (eight) hours as needed. for anxiety    . Coenzyme Q10 (COQ10) 100 MG CAPS Take by mouth.    . escitalopram (LEXAPRO) 10 MG tablet Take 10 mg by mouth daily.    . hydrochlorothiazide (MICROZIDE) 12.5 MG capsule Take 1 capsule (12.5 mg total) by mouth daily. 90 capsule 3  . magnesium gluconate (MAGONATE) 500 MG tablet Take 500 mg by mouth daily.    . temazepam (RESTORIL) 30 MG capsule Take 30 mg by mouth at bedtime as needed.    . Zinc 50 MG CAPS zinc     No current facility-administered medications for this visit.   Facility-Administered Medications Ordered in Other Visits  Medication Dose Route Frequency Provider Last Rate Last Admin  . Sonafine emulsion 1 application  1 application Topical Once Gery Pray, MD        OBJECTIVE: White woman who appears stated age  70:   09/27/20 0958  BP: (!) 145/83  Pulse: 72  Resp: 18  Temp: (!) 97.2 F (36.2 C)  SpO2: 97%     Body mass index is 26.94 kg/m.   Wt Readings from Last 3 Encounters:  09/27/20 147 lb 4.8 oz (66.8 kg)  09/14/20 145 lb 6.4 oz (66 kg)  07/12/20 143 lb (64.9 kg)      ECOG FS:1 - Symptomatic but completely ambulatory  Sclerae unicteric, EOMs intact Wearing a mask No cervical or supraclavicular adenopathy Lungs no rales or rhonchi Heart regular rate and rhythm Abd soft, nontender, positive bowel sounds MSK no focal spinal tenderness, no upper  extremity lymphedema Neuro: nonfocal, well oriented, appropriate affect Breasts: The right breast is status post mastectomy.  The incision has healed very nicely.  There is minimal superficial desquamation remaining, minimal erythema, but no dehiscence or swelling.  The left breast and both axillae are benign   LAB RESULTS:  CMP  Component Value Date/Time   NA 143 09/27/2020 0943   K 3.9 09/27/2020 0943   CL 105 09/27/2020 0943   CO2 27 09/27/2020 0943   GLUCOSE 104 (H) 09/27/2020 0943   BUN 14 09/27/2020 0943   CREATININE 0.79 09/27/2020 0943   CREATININE 0.86 03/17/2020 1223   CREATININE 0.71 04/25/2013 1044   CALCIUM 9.6 09/27/2020 0943   PROT 7.3 09/27/2020 0943   ALBUMIN 4.0 09/27/2020 0943   AST 16 09/27/2020 0943   AST 14 (L) 03/17/2020 1223   ALT 12 09/27/2020 0943   ALT 12 03/17/2020 1223   ALKPHOS 73 09/27/2020 0943   BILITOT 0.4 09/27/2020 0943   BILITOT 0.3 03/17/2020 1223   GFRNONAA >60 09/27/2020 0943   GFRNONAA >60 03/17/2020 1223   GFRAA >90 09/13/2012 1057    No results found for: TOTALPROTELP, ALBUMINELP, A1GS, A2GS, BETS, BETA2SER, GAMS, MSPIKE, SPEI  Lab Results  Component Value Date   WBC 7.9 09/27/2020   NEUTROABS 6.1 09/27/2020   HGB 12.5 09/27/2020   HCT 38.9 09/27/2020   MCV 88.8 09/27/2020   PLT 212 09/27/2020    No results found for: LABCA2  No components found for: DUKGUR427  No results for input(s): INR in the last 168 hours.  No results found for: LABCA2  No results found for: CWC376  No results found for: EGB151  No results found for: VOH607  No results found for: CA2729  No components found for: HGQUANT  No results found for: CEA1 / No results found for: CEA1   No results found for: AFPTUMOR  No results found for: CHROMOGRNA  No results found for: KPAFRELGTCHN, LAMBDASER, KAPLAMBRATIO (kappa/lambda light chains)  No results found for: HGBA, HGBA2QUANT, HGBFQUANT, HGBSQUAN (Hemoglobinopathy evaluation)   No  results found for: LDH  No results found for: IRON, TIBC, IRONPCTSAT (Iron and TIBC)  No results found for: FERRITIN  Urinalysis    Component Value Date/Time   COLORURINE YELLOW 09/13/2012 [place] 09/13/2012 1207   LABSPEC 1.006 09/13/2012 1207   PHURINE 6.5 09/13/2012 [place] 09/13/2012 [place] 09/13/2012 1207   BILIRUBINUR neg 04/26/2016 0854   KETONESUR NEGATIVE 09/13/2012 1207   PROTEINUR neg 04/26/2016 0854   PROTEINUR NEGATIVE 09/13/2012 1207   UROBILINOGEN 0.2 04/26/2016 0854   UROBILINOGEN 0.2 09/13/2012 1207   NITRITE neg 04/26/2016 0854   NITRITE NEGATIVE 09/13/2012 1207   LEUKOCYTESUR Trace (A) 04/26/2016 0854    STUDIES: No results found.   ELIGIBLE FOR AVAILABLE RESEARCH PROTOCOL: AET  ASSESSMENT: 70 y.o. [place] woman status post right breast upper outer quadrant biopsy 03/09/2020 for a clinical T1c N0, stage IA invasive lobular carcinoma, E-cadherin negative, estrogen and progesterone receptor positive, HER-2 not amplified, with an MIB-1 of 10%.  (1) status post right mastectomy and sentinel lymph node sampling 06/09/2020 for a pT3 pN0(i+), stage IIA invasive lobular carcinoma, grade 2, with negative margins  (a) a total of 7 left axilla lymph nodes removed, 1 with isolated tumor cells  (2) Oncotype score of 10 predicts a risk of recurrence outside the breast in the next 9 years of 3% if the patient takes an antiestrogen for 5 years.  It also predicts no benefit from chemotherapy.  (3) adjuvant radiation completed 09/01/2020  (4) genetics testing 05/16/2020 through the Multi-Gene Panel offered by [facility] found no deleterious mutations in AIP, ALK, APC, ATM, AXIN2,BAP1,  BARD1, BLM, BMPR1A, BRCA1, BRCA2, BRIP1, CASR, CDC73, CDH1, CDK4, CDKN1B, CDKN1C, CDKN2A (p14ARF),  CDKN2A (p16INK4a), CEBPA, CHEK2, CTNNA1, DICER1, DIS3L2, EGFR (c.2369C>T, p.Thr790Met variant only), EPCAM (Deletion/duplication testing only),  FH, FLCN, GATA2, GPC3, GREM1 (Promoter region deletion/duplication testing only), HOXB13 (c.251G>A, p.Gly84Glu), HRAS, KIT, MAX, MEN1, MET, MITF (c.952G>A, p.Glu318Lys variant only), MLH1, MSH2, MSH3, MSH6, MUTYH, NBN, NF1, NF2, NTHL1, PALB2, PDGFRA, PHOX2B, PMS2, POLD1, POLE, POT1, PRKAR1A, PTCH1, PTEN, RAD50, RAD51C, RAD51D, RB1, RECQL4, RET, RUNX1, SDHAF2, SDHA (sequence changes only), SDHB, SDHC, SDHD, SMAD4, SMARCA4, SMARCB1, SMARCE1, STK11, SUFU, TERC, TERT, TMEM127, TP53, TSC1, TSC2, VHL, WRN and WT1.    (a) variants of uncertain significance were noted in EGFR c.3629C>T (p.Ala1210Val) and NTHL1 c.755C>G (p.Thr252Ser)    (5) anastrozole started November 2021 and continued right through local treatment   PLAN: Sydell is recovering well from her surgery and radiation.  She still has very minimal skin changes which will resolve shortly.  She has the usual sensitivity and soreness and rare shooting pains that patients do experience after the treatments she has received.  She understands all of that is normal.  She already has an appointment for bras and prostheses second the nature and I wrote her a prescription that she can take with her at that time so they can investigate how much her insurance will cover.  She is tolerating anastrozole remarkably well and the plan will be to continue that a minimum of 5 years.  And lobular breast cancers I tend to prefer 10 years because these are very slow-growing and do tend to recur late  She was concerned that her cancer was not found by mammography or ultrasonography.  She has very dense breasts and in addition lobular breast cancers are quite hard to image.  With regard to her left breast she does have a 1/2 %/year chance of developing another, different breast cancer in that breast.  Most of those breast cancers however would be ductal which are more easily imaged  Her breast density is expected to decrease with age and also by taking antiestrogens as she  is doing  She had many other questions regarding nutrition which we addressed.  I also suggested she aim for 45 minutes of walking daily 5 days a week as her goal.  She does have a dog that needs walking so that would be helpful as far as the dog's health is concerned as well!  She already has an appointment with Dr. Graywall in October and will have her bone density at that time.  She will have mammography at the breast center (she wishes to switch there) also in October.  She will see me in November.  She knows to call for any other issue that may develop before then  Total encounter time 35 minutes.*   Gustav C. Magrinat, MD 09/27/2020 10:58 AM Medical Oncology and Hematology [facility] [address] [place], [place] [address] Tel. [phone]    Fax. [phone]   This document serves as a record of services personally performed by Gustav Magrinat, MD. It was created on his behalf by Katie Daubenspeck, a trained medical scribe. The creation of this record is based on the scribe's personal observations and the provider's statements to them.   I, Gustav Magrinat MD, have reviewed the above documentation for accuracy and completeness, and I agree with the above.   *Total Encounter Time as defined by the Centers for Medicare and Medicaid Services includes, in addition to the face-to-face time of a patient visit (documented in the note above) non-face-to-face time: obtaining and reviewing outside history,   ordering and reviewing medications, tests or procedures, care coordination (communications with other health care professionals or caregivers) and documentation in the medical record.

## 2020-09-27 ENCOUNTER — Encounter: Payer: Self-pay | Admitting: Radiation Oncology

## 2020-09-27 ENCOUNTER — Inpatient Hospital Stay: Payer: BC Managed Care – PPO | Attending: Oncology | Admitting: Oncology

## 2020-09-27 ENCOUNTER — Inpatient Hospital Stay: Payer: BC Managed Care – PPO

## 2020-09-27 ENCOUNTER — Telehealth: Payer: Self-pay | Admitting: Oncology

## 2020-09-27 ENCOUNTER — Other Ambulatory Visit: Payer: Self-pay

## 2020-09-27 VITALS — BP 145/83 | HR 72 | Temp 97.2°F | Resp 18 | Ht 62.0 in | Wt 147.3 lb

## 2020-09-27 DIAGNOSIS — C50411 Malignant neoplasm of upper-outer quadrant of right female breast: Secondary | ICD-10-CM

## 2020-09-27 DIAGNOSIS — C50211 Malignant neoplasm of upper-inner quadrant of right female breast: Secondary | ICD-10-CM | POA: Insufficient documentation

## 2020-09-27 DIAGNOSIS — Z79811 Long term (current) use of aromatase inhibitors: Secondary | ICD-10-CM | POA: Insufficient documentation

## 2020-09-27 DIAGNOSIS — Z170 Estrogen receptor positive status [ER+]: Secondary | ICD-10-CM | POA: Diagnosis not present

## 2020-09-27 DIAGNOSIS — Z923 Personal history of irradiation: Secondary | ICD-10-CM | POA: Insufficient documentation

## 2020-09-27 DIAGNOSIS — Z9011 Acquired absence of right breast and nipple: Secondary | ICD-10-CM | POA: Insufficient documentation

## 2020-09-27 LAB — CBC WITH DIFFERENTIAL/PLATELET
Abs Immature Granulocytes: 0.02 10*3/uL (ref 0.00–0.07)
Basophils Absolute: 0.1 10*3/uL (ref 0.0–0.1)
Basophils Relative: 1 %
Eosinophils Absolute: 0.3 10*3/uL (ref 0.0–0.5)
Eosinophils Relative: 4 %
HCT: 38.9 % (ref 36.0–46.0)
Hemoglobin: 12.5 g/dL (ref 12.0–15.0)
Immature Granulocytes: 0 %
Lymphocytes Relative: 11 %
Lymphs Abs: 0.9 10*3/uL (ref 0.7–4.0)
MCH: 28.5 pg (ref 26.0–34.0)
MCHC: 32.1 g/dL (ref 30.0–36.0)
MCV: 88.8 fL (ref 80.0–100.0)
Monocytes Absolute: 0.5 10*3/uL (ref 0.1–1.0)
Monocytes Relative: 7 %
Neutro Abs: 6.1 10*3/uL (ref 1.7–7.7)
Neutrophils Relative %: 77 %
Platelets: 212 10*3/uL (ref 150–400)
RBC: 4.38 MIL/uL (ref 3.87–5.11)
RDW: 13.1 % (ref 11.5–15.5)
WBC: 7.9 10*3/uL (ref 4.0–10.5)
nRBC: 0 % (ref 0.0–0.2)

## 2020-09-27 LAB — COMPREHENSIVE METABOLIC PANEL WITH GFR
ALT: 12 U/L (ref 0–44)
AST: 16 U/L (ref 15–41)
Albumin: 4 g/dL (ref 3.5–5.0)
Alkaline Phosphatase: 73 U/L (ref 38–126)
Anion gap: 11 (ref 5–15)
BUN: 14 mg/dL (ref 8–23)
CO2: 27 mmol/L (ref 22–32)
Calcium: 9.6 mg/dL (ref 8.9–10.3)
Chloride: 105 mmol/L (ref 98–111)
Creatinine, Ser: 0.79 mg/dL (ref 0.44–1.00)
GFR, Estimated: >60 mL/min
Glucose, Bld: 104 mg/dL — ABNORMAL HIGH (ref 70–99)
Potassium: 3.9 mmol/L (ref 3.5–5.1)
Sodium: 143 mmol/L (ref 135–145)
Total Bilirubin: 0.4 mg/dL (ref 0.3–1.2)
Total Protein: 7.3 g/dL (ref 6.5–8.1)

## 2020-09-27 NOTE — Telephone Encounter (Signed)
Scheduled per los. Gave avs and calendar  

## 2020-09-28 ENCOUNTER — Encounter: Payer: Self-pay | Admitting: Radiology

## 2020-09-28 ENCOUNTER — Ambulatory Visit: Payer: BC Managed Care – PPO

## 2020-10-01 NOTE — Progress Notes (Signed)
Radiation Oncology         (336) [phone] ________________________________  Name: Terri Gay MRN: [id number]  Date: 10/04/2020  DOB: 1951/05/12  Follow-Up Visit Note  CC: Dorothyann Peng, NP  Magrinat, Virgie Dad, MD    ICD-10-CM   1. Malignant neoplasm of upper-outer quadrant of right breast in female, estrogen receptor positive ([place])  C50.411    Z17.0     Diagnosis:   StagepT3, pN0 (i+), Mx, RightBreast UOQ,Invasive LobularCarcinoma with LCIS, ER+/ PR+/ Her2-, Grade2  Interval Since Last Radiation:  1 month  07/21/20-09/01/20     Narrative:  The patient returns today for routine follow-up.  Patient saw Dr. Jana Hakim and was instructed to continue using anastrozole on 09/27/20.      She reports that her skin is healed well.  She has some sensitivity in the treatment area but no pain or itching.                       Allergies:  is allergic to amoxicillin.  Meds: Current Outpatient Medications  Medication Sig Dispense Refill  . acetaminophen (TYLENOL) 500 MG tablet Take 500 mg by mouth every 6 (six) hours as needed.    Marland Kitchen anastrozole (ARIMIDEX) 1 MG tablet Take 1 tablet (1 mg total) by mouth daily. 90 tablet 6  . atorvastatin (LIPITOR) 10 MG tablet Take 1 tablet (10 mg total) by mouth daily. 90 tablet 3  . atorvastatin (LIPITOR) 20 MG tablet Take 1 tablet (20 mg total) by mouth daily. 90 tablet 3  . Bioflavonoid Products (VITAMIN C PLUS) 1000 MG TABS Vitamin C    . bisoprolol (ZEBETA) 10 MG tablet Take 1 tablet (10 mg total) by mouth daily. 90 tablet 3  . BLACK ELDERBERRY,BERRY-FLOWER, PO Take by mouth.    . Cetirizine HCl (ZYRTEC ALLERGY) 10 MG CAPS     . Cholecalciferol (VITAMIN D-3) 5000 UNITS TABS Take 5,000 Units by mouth daily.    . clonazePAM (KLONOPIN) 1 MG tablet Take 1 mg by mouth every 8 (eight) hours as needed. for anxiety    . Coenzyme Q10 (COQ10) 100 MG CAPS Take by mouth.    . escitalopram (LEXAPRO) 10 MG tablet Take 10 mg by mouth daily.    .  hydrochlorothiazide (MICROZIDE) 12.5 MG capsule Take 1 capsule (12.5 mg total) by mouth daily. 90 capsule 3  . magnesium gluconate (MAGONATE) 500 MG tablet Take 500 mg by mouth daily.    . temazepam (RESTORIL) 30 MG capsule Take 30 mg by mouth at bedtime as needed.    . Zinc 50 MG CAPS zinc     No current facility-administered medications for this encounter.   Facility-Administered Medications Ordered in Other Encounters  Medication Dose Route Frequency Provider Last Rate Last Admin  . Sonafine emulsion 1 application  1 application Topical Once Gery Pray, MD        Physical Findings: The patient is in no acute distress. Patient is alert and oriented.  No significant changes. Lungs are clear to auscultation bilaterally. Heart has regular rate and rhythm. No palpable cervical, supraclavicular, or axillary adenopathy. Abdomen soft, non-tender, normal bowel sounds. Left breast: No palpable mass nipple discharge or bleeding Right chest wall: Skin is healed well.  Mild hyperpigmentation changes and mild erythema.  No palpable or visible signs of recurrence.      Lab Findings: Lab Results  Component Value Date   WBC 7.9 09/27/2020   HGB 12.5 09/27/2020   HCT 38.9 09/27/2020  MCV 88.8 09/27/2020   PLT 212 09/27/2020    Radiographic Findings: No results found.  Impression:   StagepT3, pN0 (i+), Mx, RightBreast UOQ,Invasive LobularCarcinoma with LCIS, ER+/ PR+/ Her2-, Grade2   The patient is recovering from the effects of radiation.  Skin is healed well at this time  Plan: As needed follow-up in radiation oncology.  The patient will continue close follow-up in medical oncology,  will continue on adjuvant hormonal therapy    ____________________________________  Blair Promise, PhD, MD   This document serves as a record of services personally performed by Gery Pray, MD. It was created on his behalf by Roney Mans, a trained medical scribe. The creation of this  record is based on the scribe's personal observations and the provider's statements to them. This document has been checked and approved by the attending provider.

## 2020-10-01 NOTE — Progress Notes (Incomplete)
  Radiation Oncology         (336) [phone] ________________________________  Name: Terri Gay MRN: [id number]  Date: 09/01/2020  DOB: 06/06/1950  End of Treatment Note  Diagnosis:   StagepT3, pN0 (i+), Mx, RightBreast UOQ,Invasive LobularCarcinoma with LCIS, ER+/ PR+/ Her2-, Grade2     Indication for treatment:  Curative       Radiation treatment dates:   07/21/20 - 09/01/20    Narrative: The patient tolerated radiation treatment relatively well.   She experienced some fatigue, as well as some dermatitis and erythema. There was no skin breakdown throughout.  Plan: The patient has completed radiation treatment. The patient will return to radiation oncology clinic for routine followup in one month. I advised them to call or return sooner if they have any questions or concerns related to their recovery or treatment.  -----------------------------------  Blair Promise, PhD, MD  This document serves as a record of services personally performed by Gery Pray, MD. It was created on his behalf by Wilburn Mylar, a trained medical scribe. The creation of this record is based on the scribe's personal observations and the provider's statements to them. This document has been checked and approved by the attending provider.

## 2020-10-04 ENCOUNTER — Ambulatory Visit
Admission: RE | Admit: 2020-10-04 | Discharge: 2020-10-04 | Disposition: A | Discharge status: Home / Self Care | Payer: BC Managed Care – PPO | Source: Ambulatory Visit | Attending: Radiation Oncology | Admitting: Radiation Oncology

## 2020-10-04 ENCOUNTER — Other Ambulatory Visit: Payer: Self-pay

## 2020-10-04 DIAGNOSIS — Z170 Estrogen receptor positive status [ER+]: Secondary | ICD-10-CM | POA: Diagnosis not present

## 2020-10-04 DIAGNOSIS — Z79811 Long term (current) use of aromatase inhibitors: Secondary | ICD-10-CM | POA: Diagnosis not present

## 2020-10-04 DIAGNOSIS — Z79899 Other long term (current) drug therapy: Secondary | ICD-10-CM | POA: Diagnosis not present

## 2020-10-04 DIAGNOSIS — C50411 Malignant neoplasm of upper-outer quadrant of right female breast: Secondary | ICD-10-CM | POA: Diagnosis present

## 2020-10-04 HISTORY — DX: Personal history of irradiation: Z92.3

## 2020-10-04 NOTE — Progress Notes (Signed)
Terri Gay is here today for follow up post radiation to the breast.   Breast Side:right   They completed their radiation on: 09/01/2020   Does the patient complain of any of the following: . Post radiation skin issues: Reports skin sensitivity, otherwise skin has returned to pretreatment color. Reports mild dryness. . Breast Tenderness: denies . Breast Swelling: denies . Lymphadema: denies . Range of Motion limitations: reports has returned to baseline . Fatigue post radiation: Reports occasional mild fatigue. Marland Kitchen Appetite good/fair/poor: good . Sleep - fair, occasionally unable to sleep well  Additional comments if applicable: Denies nausea or vomiting.   There were no vitals filed for this visit.

## 2020-11-29 ENCOUNTER — Ambulatory Visit: Payer: BC Managed Care – PPO | Attending: General Surgery

## 2020-11-29 ENCOUNTER — Other Ambulatory Visit: Payer: Self-pay

## 2020-11-29 DIAGNOSIS — Z483 Aftercare following surgery for neoplasm: Secondary | ICD-10-CM

## 2020-11-29 NOTE — Therapy (Signed)
[address], [place], [address] Phone: [phone]   Fax:  [phone]  Physical Therapy Treatment  Patient Details  Name: Terri Gay MRN: [id number] Date of Birth: [birth year] Referring Provider (PT): Dr. Autumn Messing   Encounter Date: 11/29/2020   PT End of Session - 11/29/20 1030     Visit Number 2   # unchanged due to screen only   PT Start Time 1005    PT Stop Time 1023    PT Time Calculation (min) 18 min    Activity Tolerance Patient tolerated treatment well    Behavior During Therapy [facility] for tasks assessed/performed             Past Medical History:  Diagnosis Date   Allergy    seasonal   Anxiety    Arthritis    Cancer ([place]) 2021   right breast IDC with DCIS   Depression    Diverticulosis 10/07/2010   Colonoscopy   GERD (gastroesophageal reflux disease)    OTC   Hemorrhoids    History of chicken pox    History of radiation therapy 07/21/2020-09/01/2020   Right breast- Dr. Gery Pray   Hyperlipidemia    Hypertension    Knee pain, left 10/24/2013   Osteopenia 04/25/2013   Osteoporosis    Palpitations 04/26/2013   Preventative health care 04/26/2013    Past Surgical History:  Procedure Laterality Date   MASTECTOMY W/ SENTINEL NODE BIOPSY Right 06/09/2020   Procedure: RIGHT MASTECTOMY WITH RIGHT AXILLARY SENTINEL LYMPH NODE BIOPSY;  Surgeon: Jovita Kussmaul, MD;  Location: [place];  Service: General;  Laterality: Right;   NO PAST SURGERIES      There were no vitals filed for this visit.   Subjective Assessment - 11/29/20 1018     Subjective Pt returns for her 3 month L-Dex screen.    Pertinent History Patient was diagnosed on 03/01/2020 with right grade II invasive lobular carcinoma breast cancer. She underwent a right mastectomy and sentinel node biopsy (1/6 positive axillary lymph nodes) on 06/09/2020. It is ER/PR positive and HER2 negative with a Ki67 of 10%.                     L-DEX FLOWSHEETS - 11/29/20 1000       L-DEX LYMPHEDEMA SCREENING   Measurement Type Unilateral    L-DEX MEASUREMENT EXTREMITY Upper Extremity    POSITION  Standing    DOMINANT SIDE Right    At Risk Side Right    BASELINE SCORE (UNILATERAL) -6.1    L-DEX SCORE (UNILATERAL) -3.7    VALUE CHANGE (UNILAT) 2.4                                    PT Long Term Goals - 08/02/20 1204       PT LONG TERM GOAL #1   Title Patient will demonstrate she has regained full shoulder ROM and function post operatively compared ot baselines.    Time 8    Period Weeks    Status Achieved                   Plan - 11/29/20 1031     Clinical Impression Statement Pt returns for her 3 month L-Dex screen. Her change from baseline of 2.4 is WNLs so no further treatment is required at this time except  to cont every 3 month L-Dex screens which pt is agreeble to.    PT Next Visit Plan Continue L-Dex screens for up to 2 years from her SLNB.    Consulted and Agree with Plan of Care Patient             Patient will benefit from skilled therapeutic intervention in order to improve the following deficits and impairments:     Visit Diagnosis: Aftercare following surgery for neoplasm     Problem List Patient Active Problem List   Diagnosis Date Noted   Cancer of right female breast ([place]) 06/09/2020   Genetic testing 05/17/2020   Malignant neoplasm of upper-outer quadrant of right breast in female, estrogen receptor positive ([place]) 03/11/2020   Knee pain, left 10/24/2013   Palpitations 04/26/2013   Preventative health care 04/26/2013   Osteopenia 04/25/2013   Acute esophagitis 09/19/2012   GERD (gastroesophageal reflux disease) 03/03/2012   Essential hypertension 08/23/2011   Rectal bleeding 06/23/2011   Hyperlipidemia 03/02/2011   Depression with anxiety 01/26/2011   Insomnia 01/26/2011   Diverticulosis 01/17/2011   Hemorrhoids  01/17/2011   Osteoarthritis 01/17/2011    Otelia Limes, PTA 11/29/2020, 10:34 AM  [place], [place], [address] Phone: [phone]   Fax:  [phone]  Name: Terri Gay MRN: [id number] Date of Birth: 27-Jul-1950

## 2020-12-11 ENCOUNTER — Encounter: Payer: Self-pay | Admitting: Gastroenterology

## 2021-01-25 DIAGNOSIS — C50411 Malignant neoplasm of upper-outer quadrant of right female breast: Secondary | ICD-10-CM | POA: Diagnosis not present

## 2021-01-25 DIAGNOSIS — Z170 Estrogen receptor positive status [ER+]: Secondary | ICD-10-CM | POA: Diagnosis not present

## 2021-01-28 ENCOUNTER — Other Ambulatory Visit: Payer: Self-pay | Admitting: General Surgery

## 2021-01-28 DIAGNOSIS — C50411 Malignant neoplasm of upper-outer quadrant of right female breast: Secondary | ICD-10-CM

## 2021-01-28 DIAGNOSIS — Z170 Estrogen receptor positive status [ER+]: Secondary | ICD-10-CM

## 2021-02-12 ENCOUNTER — Ambulatory Visit
Admission: RE | Admit: 2021-02-12 | Discharge: 2021-02-12 | Disposition: A | Discharge status: Home / Self Care | Payer: BC Managed Care – PPO | Source: Ambulatory Visit | Attending: General Surgery | Admitting: General Surgery

## 2021-02-12 DIAGNOSIS — R928 Other abnormal and inconclusive findings on diagnostic imaging of breast: Secondary | ICD-10-CM | POA: Diagnosis not present

## 2021-02-12 DIAGNOSIS — Z170 Estrogen receptor positive status [ER+]: Secondary | ICD-10-CM

## 2021-02-12 DIAGNOSIS — C50411 Malignant neoplasm of upper-outer quadrant of right female breast: Secondary | ICD-10-CM

## 2021-02-12 IMAGING — MR MR BREAST BILAT WO/W CM
17 of 19 series · 41 of 48 positions shown · IV contrast (gadavist)
Comparison: Previous exam(s).

CLINICAL DATA: Patient presents for breast MRI post right malignant
mastectomy [DATE].

LABS:  None.
EXAM:
BILATERAL BREAST MRI WITH AND WITHOUT CONTRAST
TECHNIQUE: Multiplanar, multisequence MR images of both breasts were obtained
prior to and following the intravenous administration of 9 ml of
Gadavist.

[Series 2: t2_tirm_tra ipat (a-p) · axial · 3.0mm · 0.70mm/px · 1 of 53 slices shown]
[im 1/53]
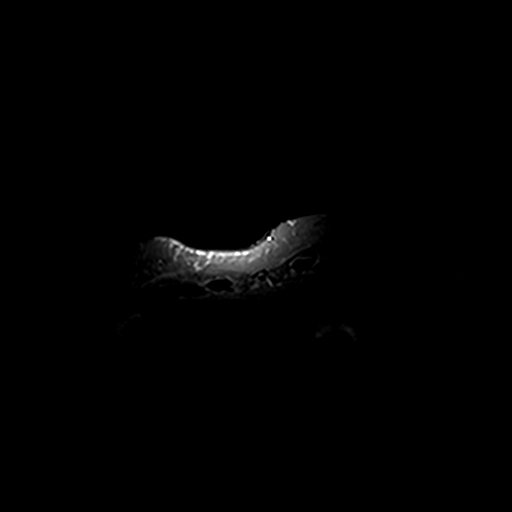

[Series 3: fl3d pre-cm no · axial · non-contrast · 1.2mm · 1.18mm/px · z∈[-77,+94]mm · 2 of 144 slices shown]
[im 1/144]
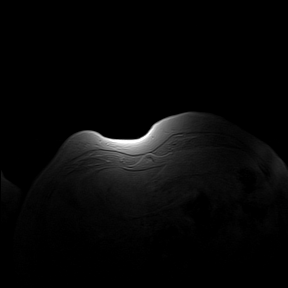
[im 144/144]
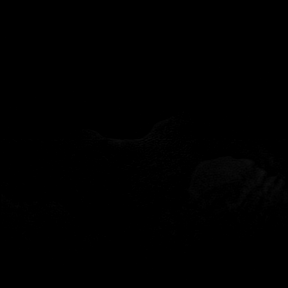

[Series 4: fl3d pre-cm · axial · non-contrast · 1.2mm · 1.06mm/px · z∈[-77,+94]mm · 3 of 144 slices shown (1 of 2)]
[im 1/144]
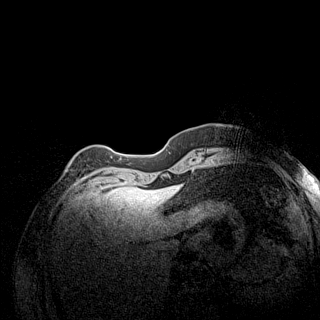
[im 72/144]
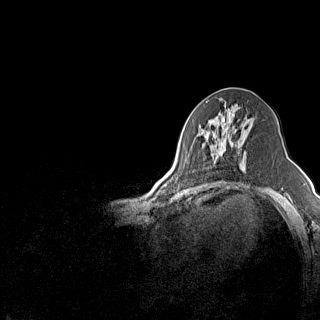
[im 144/144]
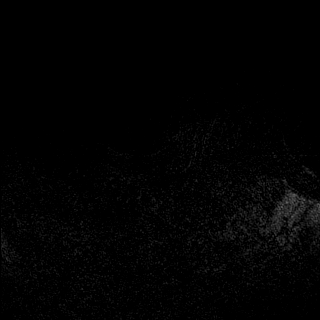

[Series 5: fl3d post-cm 20 · axial · 1.2mm · 1.06mm/px · z∈[-77,+94]mm · 3 of 144 slices shown (1 of 5)]
[im 1/144]
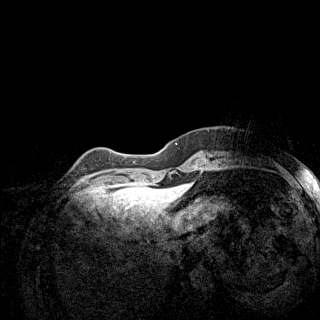
[im 72/144]
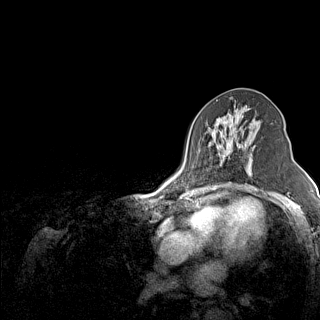
[im 144/144]
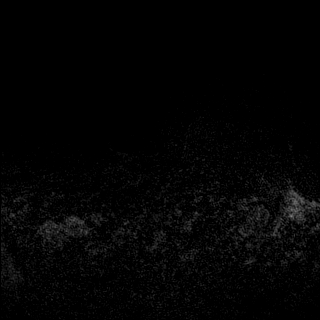

[Series 6: fl3d post-cm 20 · axial · 1.2mm · 1.06mm/px · z∈[-77,+94]mm · 3 of 144 slices shown (2 of 5)]
[im 1/144]
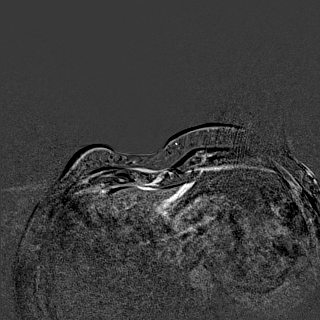
[im 72/144]
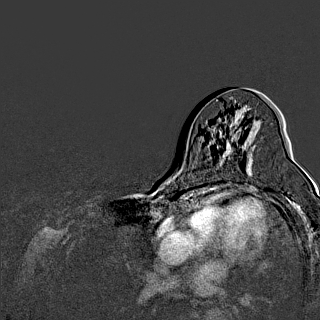
[im 144/144]
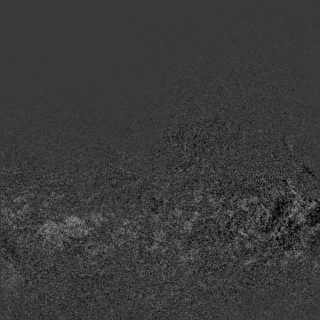

[Series 7: fl3d post-cm 20 · axial · 172.8mm · 1.06mm/px · 1 of 1 slices shown (3 of 5)]
[im 1/1]
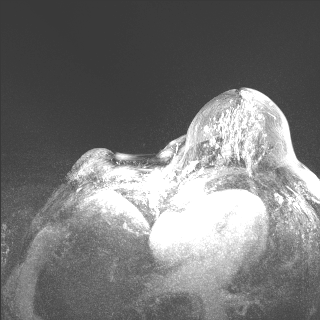

[Series 8: fl3d post-cm 3 · axial · 1.2mm · 1.06mm/px · z∈[-77,+94]mm · 3 of 144 slices shown (1 of 5)]
[im 1/144]
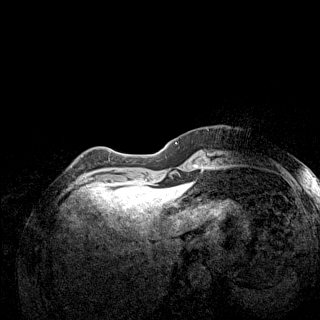
[im 72/144]
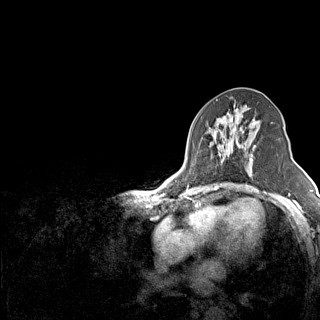
[im 144/144]
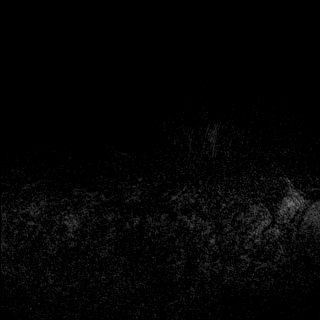

[Series 9: fl3d post-cm 3 · axial · 1.2mm · 1.06mm/px · z∈[-77,+94]mm · 3 of 144 slices shown (2 of 5)]
[im 1/144]
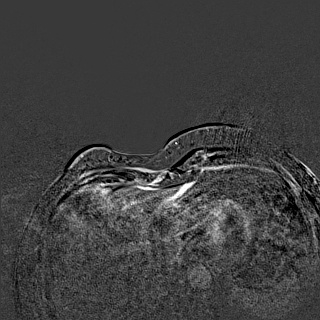
[im 72/144]
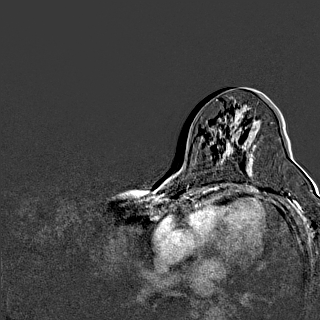
[im 144/144]
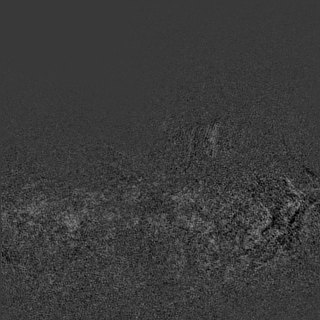

[Series 10: fl3d post-cm 3 · axial · 172.8mm · 1.06mm/px · 1 of 1 slices shown (3 of 5)]
[im 1/1]
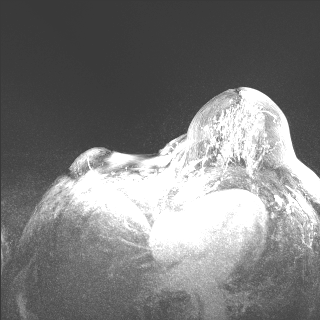

[Series 11: fl3d post-cm 5 · axial · 1.2mm · 1.18mm/px · z∈[-77,+94]mm · 3 of 144 slices shown (1 of 3)]
[im 1/144]
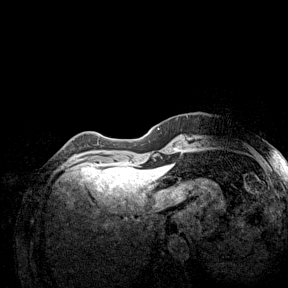
[im 72/144]
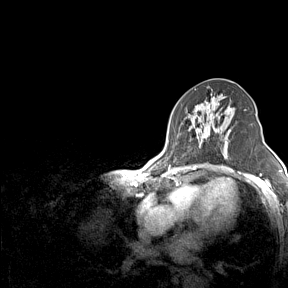
[im 144/144]
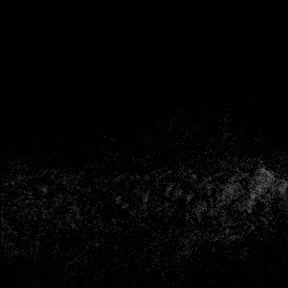

[Series 12: fl3d post-cm 5 · axial · 1.2mm · 1.18mm/px · z∈[-77,+94]mm · 3 of 144 slices shown (2 of 3)]
[im 1/144]
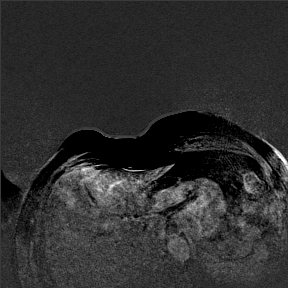
[im 72/144]
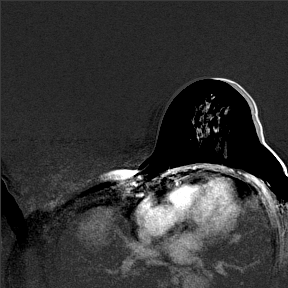
[im 144/144]
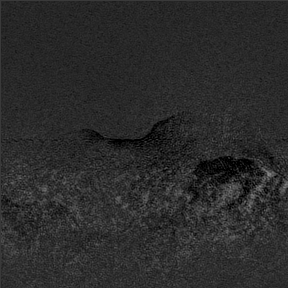

[Series 13: fl3d post-cm 5 · axial · 172.8mm · 1.18mm/px · 1 of 1 slices shown (3 of 3)]
[im 1/1]
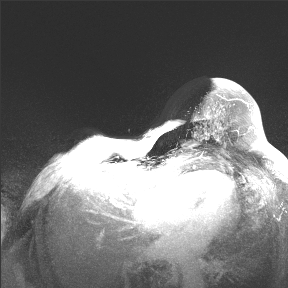

[Series 17: fl3d pre-cm · axial · non-contrast · 1.2mm · 0.89mm/px · z∈[-76,+95]mm · 3 of 144 slices shown (2 of 2)]
[im 1/144]
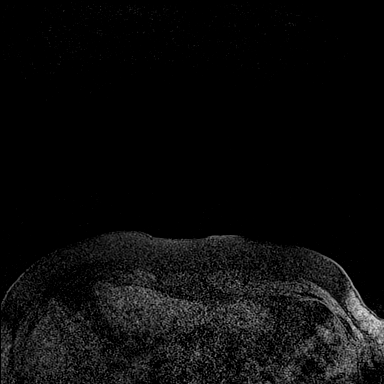
[im 72/144]
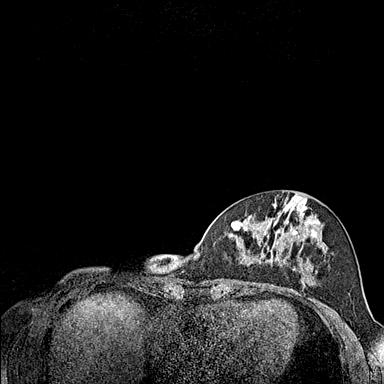
[im 144/144]
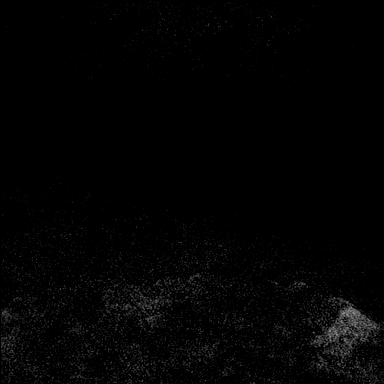

[Series 18: fl3d post-cm 20 · axial · 1.2mm · 0.89mm/px · z∈[-76,+95]mm · 3 of 144 slices shown (4 of 5)]
[im 1/144]
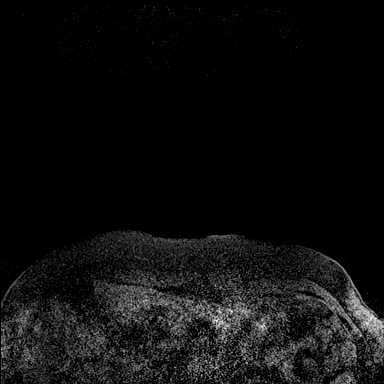
[im 72/144]
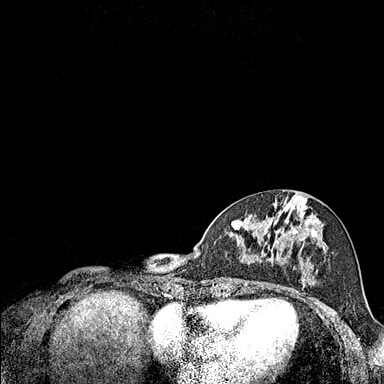
[im 144/144]
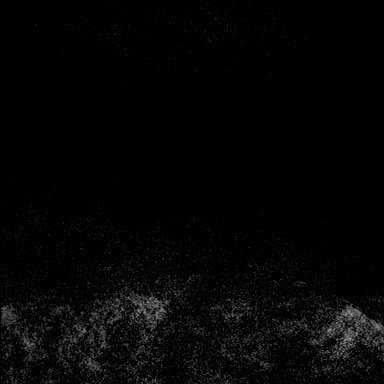

[Series 19: fl3d post-cm 20 · axial · 1.2mm · 0.89mm/px · z∈[-76,+95]mm · 3 of 144 slices shown (5 of 5)]
[im 1/144]
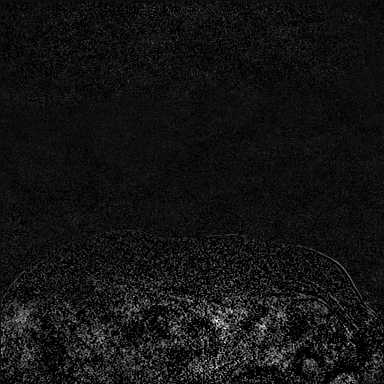
[im 72/144]
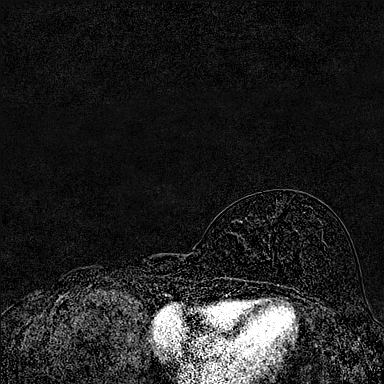
[im 144/144]
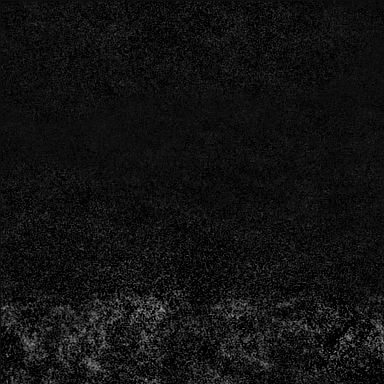

[Series 21: fl3d post-cm 3 · axial · 1.2mm · 0.89mm/px · z∈[-76,+95]mm · 3 of 144 slices shown (4 of 5)]
[im 1/144]
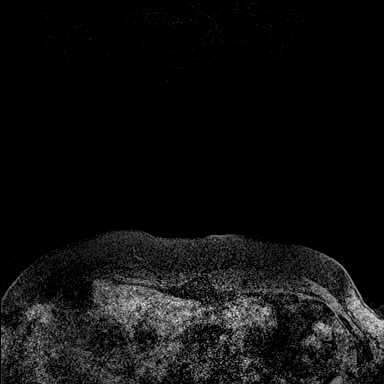
[im 72/144]
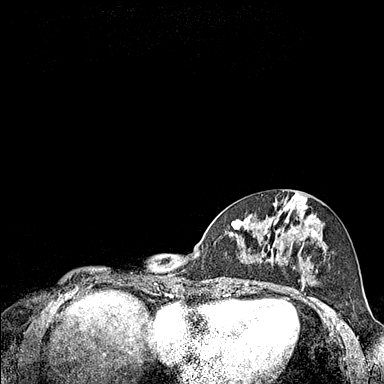
[im 144/144]
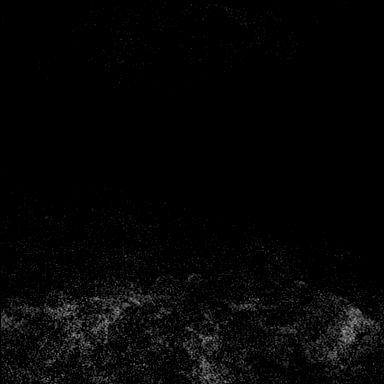

[Series 22: fl3d post-cm 3 · axial · 1.2mm · 0.89mm/px · z∈[-76,+9]mm · 2 of 144 slices shown (5 of 5)]
[im 1/144]
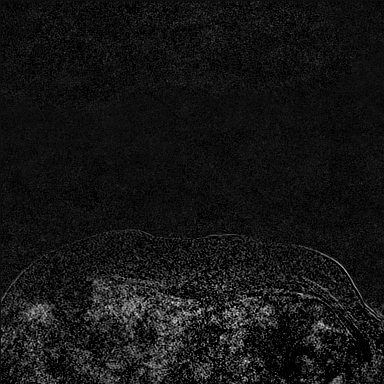
[im 72/144]
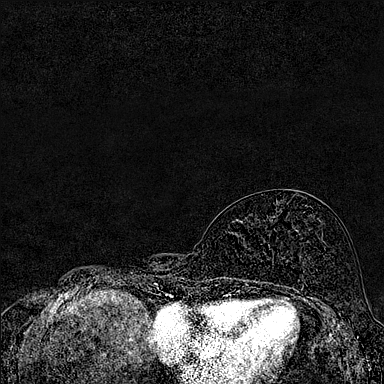

[41 of 48 positions shown; findings below may reference images not displayed]

Patient was brought back as multiple images repeated due to motion
on prior exam.

Three-dimensional MR images were rendered by post-processing of the
original MR data on an independent workstation. The
three-dimensional MR images were interpreted, and findings are
reported in the following complete MRI report for this study. Three
dimensional images were evaluated at the independent interpreting
workstation using the DynaCAD thin client.
FINDINGS: Breast composition: c. Heterogeneous fibroglandular tissue.

Background parenchymal enhancement: Mild

Right breast: Previous right mastectomy. No suspicious masses or
enhancement over the mastectomy site.

Left breast: No suspicious mass or abnormal enhancement.

Lymph nodes: No abnormal appearing lymph nodes.

Ancillary findings:  None.
IMPRESSION: No MRI evidence of malignancy within the left breast. Status post
right mastectomy.

RECOMMENDATION:
Recommend continued annual 3D screening mammographic follow-up of
the left breast and follow-up breast MRI as clinically indicated.

BI-RADS CATEGORY  1: Negative.

## 2021-02-12 MED ORDER — GADOBUTROL 1 MMOL/ML IV SOLN
9.0000 mL | Freq: Once | INTRAVENOUS | Status: AC | PRN
  Administered 2021-02-12: 9 mL via INTRAVENOUS

## 2021-02-15 ENCOUNTER — Ambulatory Visit: Payer: BC Managed Care – PPO

## 2021-02-17 ENCOUNTER — Other Ambulatory Visit: Payer: Self-pay | Admitting: General Surgery

## 2021-02-17 ENCOUNTER — Telehealth: Payer: Self-pay | Admitting: Adult Health

## 2021-02-17 DIAGNOSIS — Z170 Estrogen receptor positive status [ER+]: Secondary | ICD-10-CM

## 2021-02-17 DIAGNOSIS — C50411 Malignant neoplasm of upper-outer quadrant of right female breast: Secondary | ICD-10-CM

## 2021-02-17 NOTE — Telephone Encounter (Signed)
Spoke with patient to schedule awv.  She stated she didn't want to schedule at this times.  Call back in 2023

## 2021-02-18 ENCOUNTER — Ambulatory Visit
Admission: RE | Admit: 2021-02-18 | Discharge: 2021-02-18 | Disposition: A | Discharge status: Home / Self Care | Payer: BC Managed Care – PPO | Source: Ambulatory Visit | Attending: General Surgery | Admitting: General Surgery

## 2021-02-18 DIAGNOSIS — Z170 Estrogen receptor positive status [ER+]: Secondary | ICD-10-CM

## 2021-02-18 DIAGNOSIS — C50411 Malignant neoplasm of upper-outer quadrant of right female breast: Secondary | ICD-10-CM

## 2021-02-28 ENCOUNTER — Other Ambulatory Visit: Payer: Self-pay

## 2021-02-28 ENCOUNTER — Ambulatory Visit: Payer: BC Managed Care – PPO | Attending: General Surgery

## 2021-02-28 VITALS — Wt 152.2 lb

## 2021-02-28 DIAGNOSIS — Z483 Aftercare following surgery for neoplasm: Secondary | ICD-10-CM | POA: Insufficient documentation

## 2021-02-28 NOTE — Therapy (Signed)
[facility] @ [place], [place], [address] Phone: [phone]   Fax:  [phone]  Physical Therapy Treatment  Patient Details  Name: Terri Gay MRN: [id number] Date of Birth: 1951-03-19 Referring Provider (PT): Dr. Autumn Messing   Encounter Date: 02/28/2021   PT End of Session - 02/28/21 0929     Visit Number 2   # unchanged due to screen only   PT Start Time 0926    PT Stop Time 0930    PT Time Calculation (min) 4 min    Activity Tolerance Patient tolerated treatment well    Behavior During Therapy [facility] for tasks assessed/performed             Past Medical History:  Diagnosis Date   Allergy    seasonal   Anxiety    Arthritis    Cancer ([place]) 2021   right breast IDC with DCIS   Depression    Diverticulosis 10/07/2010   Colonoscopy   GERD (gastroesophageal reflux disease)    OTC   Hemorrhoids    History of chicken pox    History of radiation therapy 07/21/2020-09/01/2020   Right breast- Dr. Gery Pray   Hyperlipidemia    Hypertension    Knee pain, left 10/24/2013   Osteopenia 04/25/2013   Osteoporosis    Palpitations 04/26/2013   Preventative health care 04/26/2013    Past Surgical History:  Procedure Laterality Date   MASTECTOMY W/ SENTINEL NODE BIOPSY Right 06/09/2020   Procedure: RIGHT MASTECTOMY WITH RIGHT AXILLARY SENTINEL LYMPH NODE BIOPSY;  Surgeon: Jovita Kussmaul, MD;  Location: [place];  Service: General;  Laterality: Right;   NO PAST SURGERIES      Vitals:   02/28/21 0928  Weight: 152 lb 4 oz (69.1 kg)     Subjective Assessment - 02/28/21 0928     Subjective Pt returns for her 3 month L-Dex screen.    Pertinent History Patient was diagnosed on 03/01/2020 with right grade II invasive lobular carcinoma breast cancer. She underwent a right mastectomy and sentinel node biopsy (1/6 positive axillary lymph nodes) on 06/09/2020. It is ER/PR positive and HER2 negative  with a Ki67 of 10%.                    L-DEX FLOWSHEETS - 02/28/21 0900       L-DEX LYMPHEDEMA SCREENING   Measurement Type Unilateral    L-DEX MEASUREMENT EXTREMITY Upper Extremity    POSITION  Standing    DOMINANT SIDE Right    At Risk Side Right    BASELINE SCORE (UNILATERAL) -6.1                                     PT Long Term Goals - 08/02/20 1204       PT LONG TERM GOAL #1   Title Patient will demonstrate she has regained full shoulder ROM and function post operatively compared ot baselines.    Time 8    Period Weeks    Status Achieved                   Plan - 02/28/21 0933     Clinical Impression Statement Pt returns for hre 3 month L-Dex screen. Her change from baseline of -0.8 is WNLs so no further treatment is required at this time except to cont  every 3 month L-Dex screens which pt is agreeable to.    PT Next Visit Plan Continue L-Dex screens for up to 2 years from her SLNB (06/09/22)    Consulted and Agree with Plan of Care Patient             Patient will benefit from skilled therapeutic intervention in order to improve the following deficits and impairments:     Visit Diagnosis: Aftercare following surgery for neoplasm     Problem List Patient Active Problem List   Diagnosis Date Noted   Cancer of right female breast ([place]) 06/09/2020   Genetic testing 05/17/2020   Malignant neoplasm of upper-outer quadrant of right breast in female, estrogen receptor positive ([place]) 03/11/2020   Knee pain, left 10/24/2013   Palpitations 04/26/2013   Preventative health care 04/26/2013   Osteopenia 04/25/2013   Acute esophagitis 09/19/2012   GERD (gastroesophageal reflux disease) 03/03/2012   Essential hypertension 08/23/2011   Rectal bleeding 06/23/2011   Hyperlipidemia 03/02/2011   Depression with anxiety 01/26/2011   Insomnia 01/26/2011   Diverticulosis 01/17/2011   Hemorrhoids 01/17/2011   Osteoarthritis  01/17/2011    Terri Gay, Terri Gay 02/28/2021, 9:34 AM  [facility] @ [place] [place], [place], [address] Phone: [phone]   Fax:  [phone]  Name: Terri Gay MRN: [id number] Date of Birth: 1950-08-17

## 2021-03-03 DIAGNOSIS — Z6827 Body mass index (BMI) 27.0-27.9, adult: Secondary | ICD-10-CM | POA: Diagnosis not present

## 2021-03-03 DIAGNOSIS — Z13820 Encounter for screening for osteoporosis: Secondary | ICD-10-CM | POA: Diagnosis not present

## 2021-03-03 DIAGNOSIS — Z01419 Encounter for gynecological examination (general) (routine) without abnormal findings: Secondary | ICD-10-CM | POA: Diagnosis not present

## 2021-03-04 ENCOUNTER — Other Ambulatory Visit: Payer: Self-pay

## 2021-03-04 ENCOUNTER — Ambulatory Visit
Admission: RE | Admit: 2021-03-04 | Discharge: 2021-03-04 | Disposition: A | Discharge status: Home / Self Care | Payer: BC Managed Care – PPO | Source: Ambulatory Visit | Attending: Adult Health | Admitting: Adult Health

## 2021-03-04 DIAGNOSIS — C50411 Malignant neoplasm of upper-outer quadrant of right female breast: Secondary | ICD-10-CM

## 2021-03-04 DIAGNOSIS — Z1231 Encounter for screening mammogram for malignant neoplasm of breast: Secondary | ICD-10-CM | POA: Diagnosis not present

## 2021-03-04 HISTORY — DX: Malignant neoplasm of unspecified site of unspecified female breast: C50.919

## 2021-03-04 IMAGING — MG DIGITAL SCREENING UNILAT LEFT W/ TOMO W/ CAD
4 series · 4 of 12 positions shown · non-contrast
Comparison: Previous exam(s).

CLINICAL DATA: Screening.

EXAM:
DIGITAL SCREENING UNILATERAL LEFT MAMMOGRAM WITH CAD AND
TOMOSYNTHESIS
TECHNIQUE: Left screening digital craniocaudal and mediolateral oblique
mammograms were obtained. Left screening digital breast
tomosynthesis was performed. The images were evaluated with
computer-aided detection.

[L CC synth-2D]
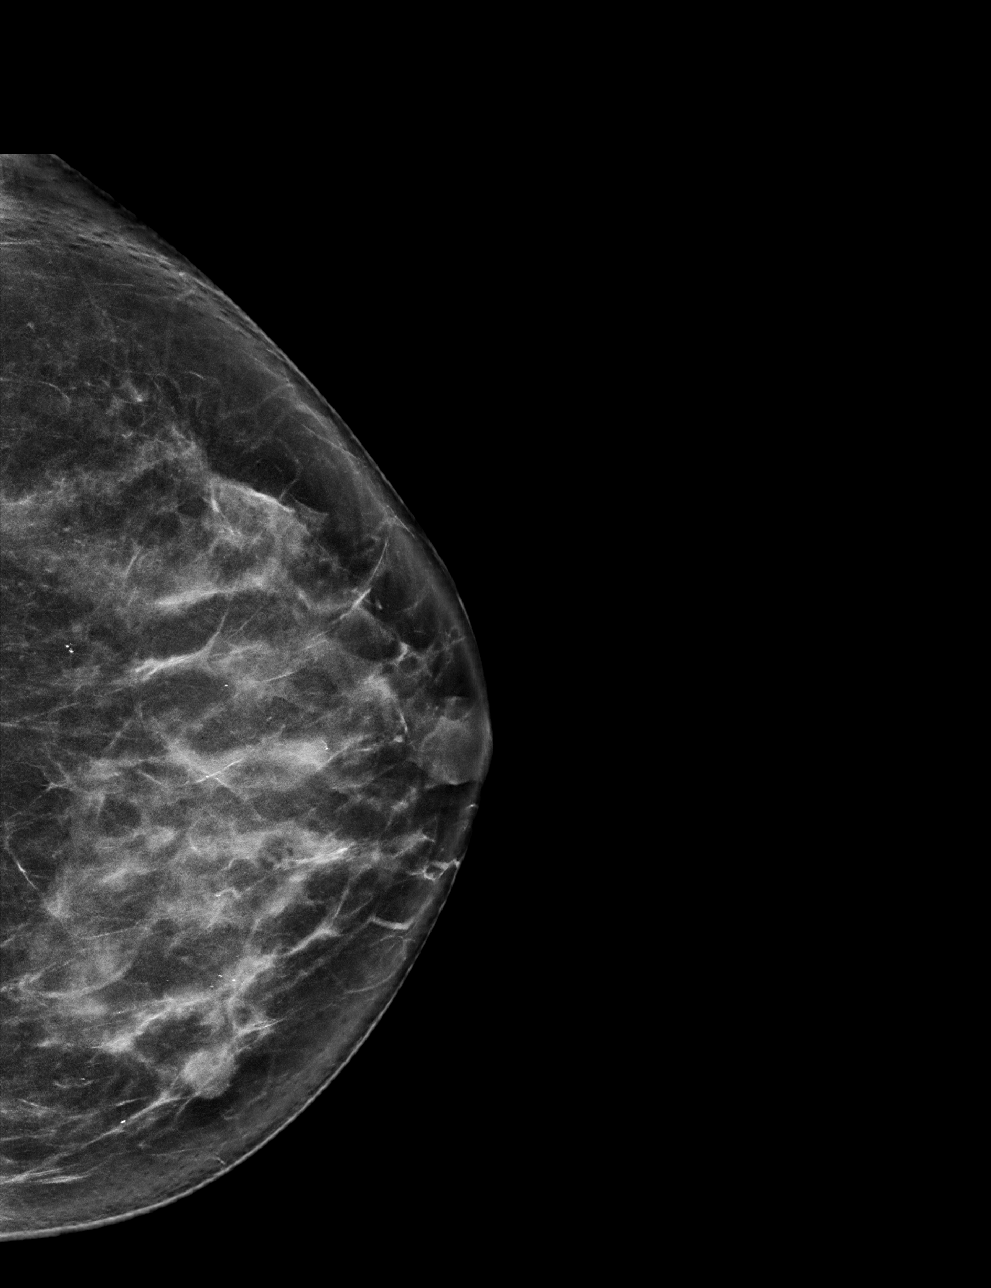

[L MLO synth-2D]
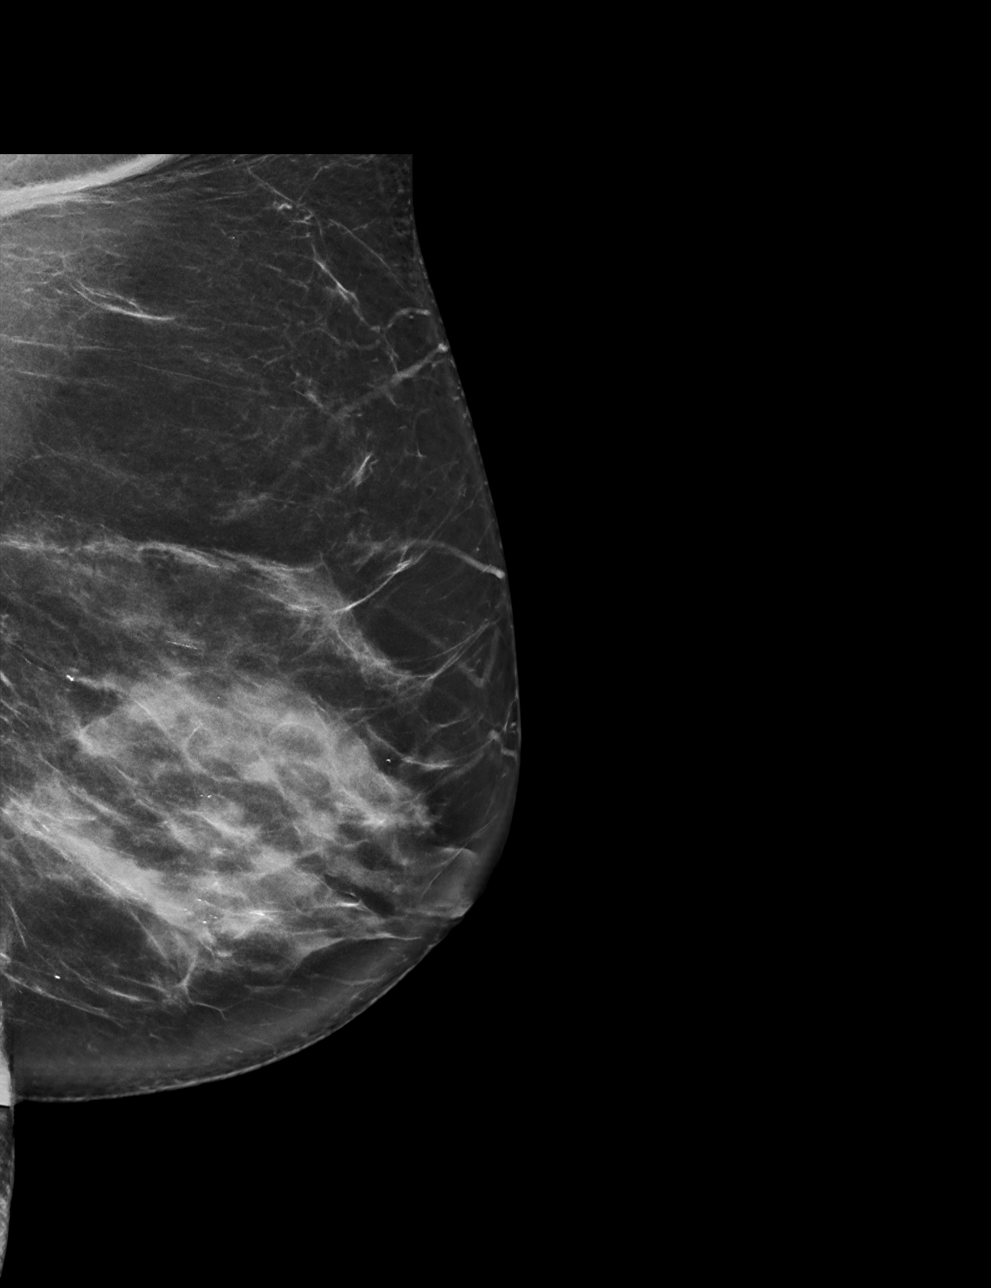

[L CC tomo · tomo slice 35/69.0]
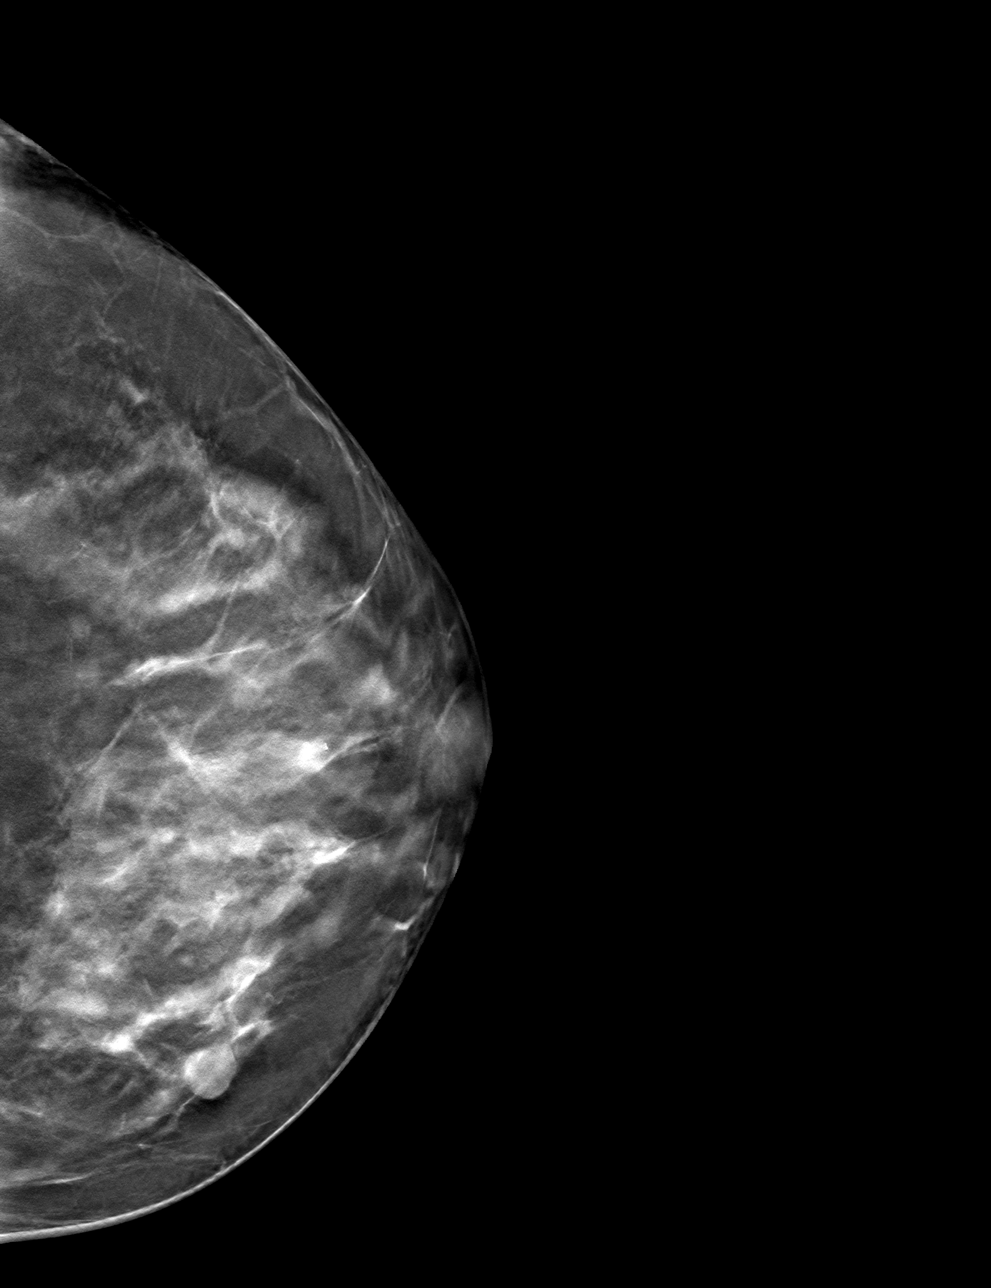

[L MLO tomo · tomo slice 39/77.0]
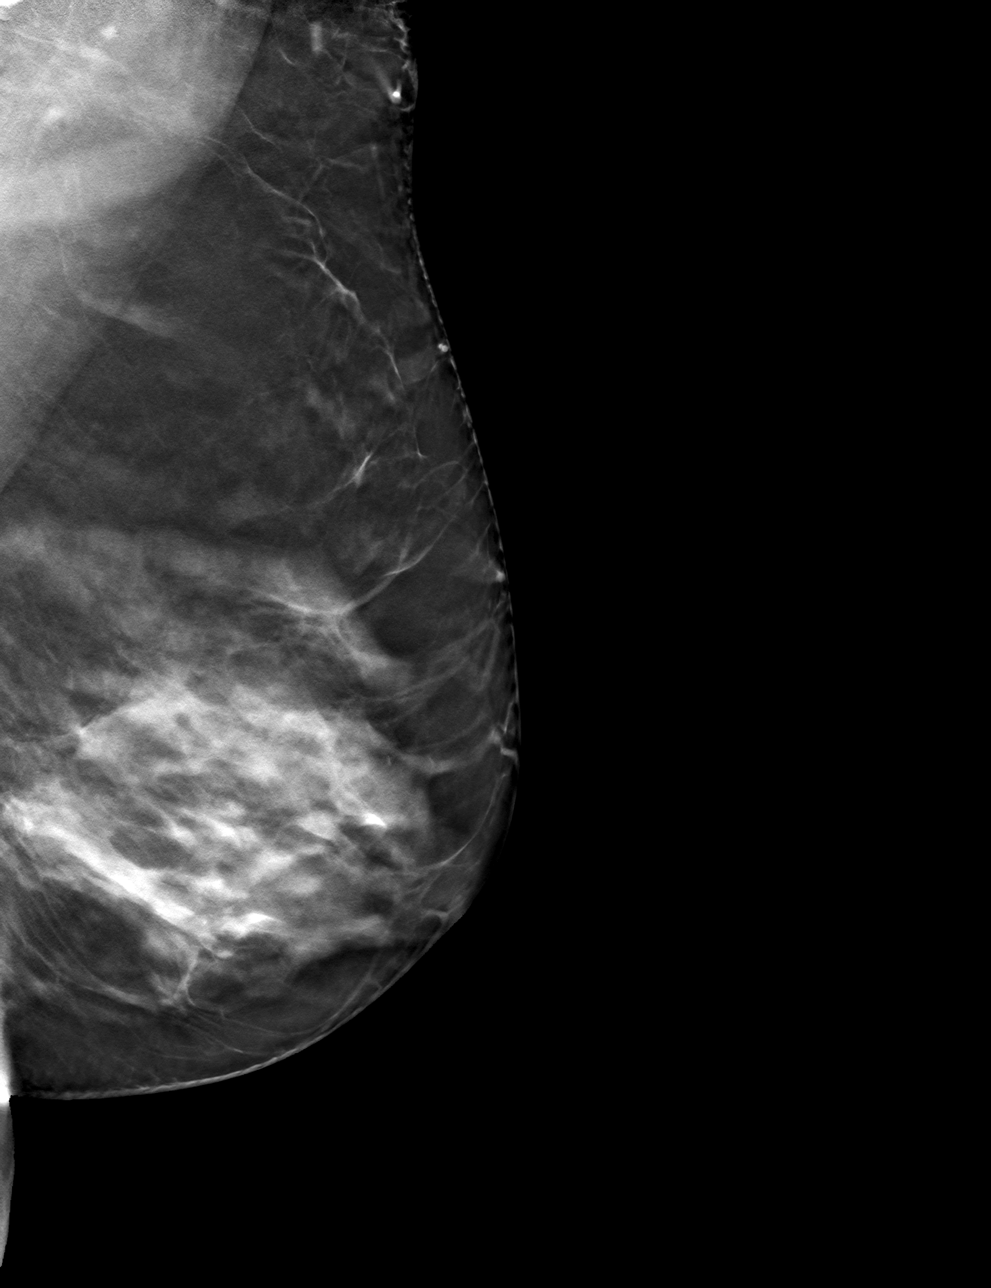

[4 of 12 positions shown; findings below may reference images not displayed]

ACR Breast Density Category c: The breast tissue is heterogeneously
dense, which may obscure small masses.
FINDINGS: The patient has had a right mastectomy. There are no findings
suspicious for malignancy on the left.
IMPRESSION: No mammographic evidence of malignancy. A result letter of this
screening mammogram will be mailed directly to the patient.

RECOMMENDATION:
Screening mammogram in one year.  (Code:[AK])

BI-RADS CATEGORY  1: Negative.

## 2021-03-25 ENCOUNTER — Other Ambulatory Visit: Payer: Self-pay | Admitting: *Deleted

## 2021-03-25 DIAGNOSIS — C50411 Malignant neoplasm of upper-outer quadrant of right female breast: Secondary | ICD-10-CM

## 2021-03-25 DIAGNOSIS — Z170 Estrogen receptor positive status [ER+]: Secondary | ICD-10-CM

## 2021-03-27 NOTE — Progress Notes (Addendum)
[facility]  [phone] [phone]     ID: Terri Gay DOB: 07/15/1950  MR#: [id number]  CSN#:[id number]  Patient Care Team: Nafziger, Cory, NP as PCP - General (Family Medicine) Grewal, Michelle, MD (Obstetrics and Gynecology) Stuart, Dawn C, RN as Oncology Nurse Navigator Martini, Keisha N, RN as Oncology Nurse Navigator Toth, Paul III, MD as Consulting Physician (General Surgery) Magrinat, Gustav C, MD as Consulting Physician (Oncology) Kinard, James, MD as Consulting Physician (Radiation Oncology) Gustav C Magrinat, MD OTHER MD:  CHIEF COMPLAINT: Estrogen receptor positive breast cancer  CURRENT TREATMENT: Anastrozole   INTERVAL HISTORY: Terri Gay returns today for follow up of her estrogen receptor positive breast cancer.   Bilateral breast MRI on 02/12/2021 showed breast density category C.  The right breast is status postmastectomy with no suspicious enhancement.  The left breast Gay benign and there were no abnormal appearing lymph nodes.  On 03/04/2021 she underwent left screening mammography at the breast center, showing breast density category C, no evidence of malignancy.  Recall she started anastrozole neoadjuvantly in November 2021.  She tolerates this well, feeling warm but not really having lots of hot flashes.  She had a bone density through her gynecologist 03/03/2021 which showed osteopenia but "she Gay happy with the results".  REVIEW OF SYSTEMS: Terri Gay having significant right hip pain.  She blames this on Lipitor which she stopped.  She started taking apple cider vinegar.  The right hip pain is now better so she is planning to start a walking program.  Right now however she is not really exercising.   COVID 19 VACCINATION STATUS: She has not received the COVID-19 vaccine she says for religious reasons.  "There are things in the vaccine that I do not agree with".  She has had COVID x2, most recently August 2022   HISTORY OF  CURRENT ILLNESS: From the original intake note:  [name] herself palpated an upper-inner right breast lump. She underwent bilateral diagnostic mammography with tomography and bilateral breast ultrasonography at [facility] on 03/01/2020 showing: breast density category D; 1.6 cm palpable right breast mass at 12 o'clock; benign simply cysts in left breast at 9 o'clock, 1 cm and 1.1 cm; benign oval cysts in right breast at 11 o'clock.  At the breast conference this morning it Gay reported that the axilla Gay negative by ultrasound  Accordingly on 03/09/2020 she proceeded to biopsy of the right breast area in question. The pathology from this procedure ([id number]) showed: invasive and in situ mammary carcinoma, e-cadherin negative, grade 2. Prognostic indicators significant for: estrogen receptor, 95% positive and progesterone receptor, 20% positive, both with strong staining intensity. Proliferation marker Ki67 at 10%. HER2 negative by immunohistochemistry (1+).  The patient's subsequent history is as detailed below.   PAST MEDICAL HISTORY: Past Medical History:  Diagnosis Date   Allergy    seasonal   Anxiety    Arthritis    Breast cancer (HCC)    Cancer (HCC) 2021   right breast IDC with DCIS   Depression    Diverticulosis 10/07/2010   Colonoscopy   GERD (gastroesophageal reflux disease)    OTC   Hemorrhoids    History of chicken pox    History of radiation therapy 07/21/2020-09/01/2020   Right breast- Dr. James Kinard   Hyperlipidemia    Hypertension    Knee pain, left 10/24/2013   Osteopenia 04/25/2013   Osteoporosis    Palpitations 04/26/2013   Preventative health care 04/26/2013      PAST SURGICAL HISTORY: Past Surgical History:  Procedure Laterality Date   MASTECTOMY     MASTECTOMY W/ SENTINEL NODE BIOPSY Right 06/09/2020   Procedure: RIGHT MASTECTOMY WITH RIGHT AXILLARY SENTINEL LYMPH NODE BIOPSY;  Surgeon: Toth, Paul III, MD;  Location: [facility];  Service:  General;  Laterality: Right;   NO PAST SURGERIES      FAMILY HISTORY: Family History  Problem Relation Age of Onset   Lung cancer Father        d. 66   Lung cancer Paternal Grandfather        smoker   Diabetes Mother    Arthritis Mother    Hyperlipidemia Mother    Hypertension Mother    Heart disease Mother        CHF   Diabetes Maternal Grandmother    Heart disease Maternal Grandmother    Arthritis Maternal Grandmother    Hyperlipidemia Maternal Grandmother    Hypertension Maternal Grandmother    Hyperlipidemia Sister    Arthritis Paternal Grandmother    Heart disease Daughter        arrythmia, Internal Cardiac Defibrillator   Lupus Son    Drug abuse Son    Colon cancer Neg Hx    Esophageal cancer Neg Hx    Rectal cancer Neg Hx    Stomach cancer Neg Hx    Her father died at age 65 from lung cancer. He had a history of tobacco use, as well as asbestos exposure while in the [employer]. Her mother died at age 87 from dementia. Terri Gay has one brother and one sister. In addition to her father, she reports lung cancer in her paternal grandfather, who also used tobacco, around age 82.   GYNECOLOGIC HISTORY:  No LMP recorded. Patient is postmenopausal. Menarche: [age] years old Age at first live birth: 70 years old GX P 2 LMP age 54 Contraceptive: used for about 30 years HRT: used for 6 years, stopped following cancer diagnosis  Hysterectomy? no BSO? 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 Husband Tommy works in sales for [employer]. Daughter Amanda Perdue, age 40, works in customer service in [place]. Son Brandon Tilley, age 44, lives in [place], [place].  The patient is not sure what his current occupation is.  [name] attends [employer], as well as [employer] on Tuesdays.    ADVANCED DIRECTIVES: In the absence of any documentation to the contrary, the patient's spouse is their  HCPOA.    HEALTH MAINTENANCE: Social History   Tobacco Use   Smoking status: Never   Smokeless tobacco: Never  Substance Use Topics   Alcohol use: Yes    Alcohol/week: 1.0 standard drink    Types: 1 Glasses of wine per week    Comment: occasionally   Drug use: No     Colonoscopy: 09/2010, Dr. Brody  PAP: 02/2020  Bone density: 01/2019, osteopenia   Allergies  Allergen Reactions   Amoxicillin Diarrhea    Current Outpatient Medications  Medication Sig Dispense Refill   acetaminophen (TYLENOL) 500 MG tablet Take 500 mg by mouth every 6 (six) hours as needed.     anastrozole (ARIMIDEX) 1 MG tablet Take 1 tablet (1 mg total) by mouth daily. 90 tablet 6   Bioflavonoid Products (VITAMIN C PLUS) 1000 MG TABS Vitamin C     bisoprolol (ZEBETA) 10 MG tablet Take 1 tablet (10 mg total) by mouth   daily. 90 tablet 3   BLACK ELDERBERRY,BERRY-FLOWER, PO Take by mouth.     Cetirizine HCl (ZYRTEC ALLERGY) 10 MG CAPS      Cholecalciferol (VITAMIN D-3) 5000 UNITS TABS Take 5,000 Units by mouth daily.     clonazePAM (KLONOPIN) 1 MG tablet Take 1 mg by mouth every 8 (eight) hours as needed. for anxiety     Coenzyme Q10 (COQ10) 100 MG CAPS Take by mouth.     escitalopram (LEXAPRO) 10 MG tablet Take 10 mg by mouth daily.     hydrochlorothiazide (MICROZIDE) 12.5 MG capsule Take 1 capsule (12.5 mg total) by mouth daily. 90 capsule 3   magnesium gluconate (MAGONATE) 500 MG tablet Take 500 mg by mouth daily.     temazepam (RESTORIL) 30 MG capsule Take 30 mg by mouth at bedtime as needed.     Zinc 50 MG CAPS zinc     No current facility-administered medications for this visit.   Facility-Administered Medications Ordered in Other Visits  Medication Dose Route Frequency Provider Last Rate Last Admin   Sonafine emulsion 1 application  1 application Topical Once Kinard, James, MD        OBJECTIVE: White woman who appears stated age  Vitals:   03/28/21 0953  BP: (!) 149/80  Pulse: 73  Resp: 16   Temp: 97.7 F (36.5 C)  SpO2: 97%      Body mass index is 27.71 kg/m.   Wt Readings from Last 3 Encounters:  03/28/21 151 lb 8 oz (68.7 kg)  02/28/21 152 lb 4 oz (69.1 kg)  09/27/20 147 lb 4.8 oz (66.8 kg)     ECOG FS:1 - Symptomatic but completely ambulatory  Sclerae unicteric, EOMs intact Wearing a mask No cervical or supraclavicular adenopathy Lungs no rales or rhonchi Heart regular rate and rhythm Abd soft, nontender, positive bowel sounds MSK no focal spinal tenderness, no upper extremity lymphedema Neuro: nonfocal, well oriented, appropriate affect Breasts: The right breast is status postmastectomy and radiation.  There is no evidence of local recurrence.  The left breast is benign.  Both axillae are benign.   LAB RESULTS:  CMP     Component Value Date/Time   NA 142 03/28/2021 0920   K 3.9 03/28/2021 0920   CL 106 03/28/2021 0920   CO2 27 03/28/2021 0920   GLUCOSE 101 (H) 03/28/2021 0920   BUN 12 03/28/2021 0920   CREATININE 0.80 03/28/2021 0920   CREATININE 0.71 04/25/2013 1044   CALCIUM 9.8 03/28/2021 0920   PROT 7.2 03/28/2021 0920   ALBUMIN 4.0 03/28/2021 0920   AST 18 03/28/2021 0920   ALT 14 03/28/2021 0920   ALKPHOS 75 03/28/2021 0920   BILITOT 0.4 03/28/2021 0920   GFRNONAA >60 03/28/2021 0920   GFRAA >90 09/13/2012 1057    No results found for: TOTALPROTELP, ALBUMINELP, A1GS, A2GS, BETS, BETA2SER, GAMS, MSPIKE, SPEI  Lab Results  Component Value Date   WBC 5.7 03/28/2021   NEUTROABS 3.6 03/28/2021   HGB 11.6 (L) 03/28/2021   HCT 35.8 (L) 03/28/2021   MCV 87.3 03/28/2021   PLT 201 03/28/2021    No results found for: LABCA2  No components found for: LABCAN125  No results for input(s): INR in the last 168 hours.  No results found for: LABCA2  No results found for: CAN199  No results found for: CAN125  No results found for: CAN153  No results found for: CA2729  No components found for: HGQUANT  No results found for: CEA1 /   No  results found for: CEA1   No results found for: AFPTUMOR  No results found for: CHROMOGRNA  No results found for: KPAFRELGTCHN, LAMBDASER, KAPLAMBRATIO (kappa/lambda light chains)  No results found for: HGBA, HGBA2QUANT, HGBFQUANT, HGBSQUAN (Hemoglobinopathy evaluation)   No results found for: LDH  No results found for: IRON, TIBC, IRONPCTSAT (Iron and TIBC)  No results found for: FERRITIN  Urinalysis    Component Value Date/Time   COLORURINE YELLOW 09/13/2012 [place] 09/13/2012 1207   LABSPEC 1.006 09/13/2012 1207   PHURINE 6.5 09/13/2012 [place] 09/13/2012 [place] 09/13/2012 1207   BILIRUBINUR neg 04/26/2016 0854   KETONESUR NEGATIVE 09/13/2012 1207   PROTEINUR neg 04/26/2016 0854   PROTEINUR NEGATIVE 09/13/2012 1207   UROBILINOGEN 0.2 04/26/2016 0854   UROBILINOGEN 0.2 09/13/2012 1207   NITRITE neg 04/26/2016 0854   NITRITE NEGATIVE 09/13/2012 1207   LEUKOCYTESUR Trace (A) 04/26/2016 0854    STUDIES: MM 3D SCREEN BREAST UNI LEFT  Result Date: 03/07/2021 CLINICAL DATA:  Screening. EXAM: DIGITAL SCREENING UNILATERAL LEFT MAMMOGRAM WITH CAD AND TOMOSYNTHESIS TECHNIQUE: Left screening digital craniocaudal and mediolateral oblique mammograms were obtained. Left screening digital breast tomosynthesis Gay performed. The images were evaluated with computer-aided detection. COMPARISON:  Previous exam(s). ACR Breast Density Category c: The breast tissue is heterogeneously dense, which may obscure small masses. FINDINGS: The patient has had a right mastectomy. There are no findings suspicious for malignancy on the left. IMPRESSION: No mammographic evidence of malignancy. A result letter of this screening mammogram will be mailed directly to the patient. RECOMMENDATION: Screening mammogram in one year.  (Code:SM-L-59M) BI-RADS CATEGORY  1: Negative. Electronically Signed   By: Kristopher Oppenheim M.D.   On: 03/07/2021 08:47     ELIGIBLE  FOR AVAILABLE RESEARCH PROTOCOL: AET  ASSESSMENT: 70 y.o. [place] woman status post right breast upper outer quadrant biopsy 03/09/2020 for a clinical T1c N0, stage IA invasive lobular carcinoma, E-cadherin negative, estrogen and progesterone receptor positive, HER-2 not amplified, with an MIB-1 of 10%.  (1) status post right mastectomy and sentinel lymph node sampling 06/09/2020 for a pT3 pN0(i+), stage IIA invasive lobular carcinoma, grade 2, with negative margins  (a) a total of 7 left axilla lymph nodes removed, 1 with isolated tumor cells  (2) Oncotype score of 10 predicts a risk of recurrence outside the breast in the next 9 years of 3% if the patient takes an antiestrogen for 5 years.  It also predicts no benefit from chemotherapy.  (3) adjuvant radiation completed 09/01/2020  (4) genetics testing 05/16/2020 through the Multi-Gene Panel offered by [facility] found no deleterious mutations in AIP, ALK, APC, ATM, AXIN2,BAP1,  BARD1, BLM, BMPR1A, BRCA1, BRCA2, BRIP1, CASR, CDC73, CDH1, CDK4, CDKN1B, CDKN1C, CDKN2A (p14ARF), CDKN2A (p16INK4a), CEBPA, CHEK2, CTNNA1, DICER1, DIS3L2, EGFR (c.2369C>T, p.Thr790Met variant only), EPCAM (Deletion/duplication testing only), FH, FLCN, GATA2, GPC3, GREM1 (Promoter region deletion/duplication testing only), HOXB13 (c.251G>A, p.Gly84Glu), HRAS, KIT, MAX, MEN1, MET, MITF (c.952G>A, p.Glu318Lys variant only), MLH1, MSH2, MSH3, MSH6, MUTYH, NBN, NF1, NF2, NTHL1, PALB2, PDGFRA, PHOX2B, PMS2, POLD1, POLE, POT1, PRKAR1A, PTCH1, PTEN, RAD50, RAD51C, RAD51D, RB1, RECQL4, RET, RUNX1, SDHAF2, SDHA (sequence changes only), SDHB, SDHC, SDHD, SMAD4, SMARCA4, SMARCB1, SMARCE1, STK11, SUFU, TERC, TERT, TMEM127, TP53, TSC1, TSC2, VHL, WRN and WT1.    (a) variants of uncertain significance were noted in EGFR c.3629C>T (p.Ala1210Val) and NTHL1 c.755C>G (p.Thr252Ser)    (5) anastrozole started November 2021 and continued right through local treatment  (a) bone  density  03/03/2021 at Dr. Grewal's office showed a T score of -1.5   PLAN: Irvin is coming up on 2 years from definitive diagnosis for her breast cancer with no evidence of disease recurrence.  This is favorable.  She is tolerating anastrozole well and the plan will be to continue that a minimum of 5 years.  She tells me she had a bone density last month and that it showed mild osteopenia.  We will try to get those results  She is interested in obtaining a breast MRI regularly because of her breast density.  We discussed this at length.  I went ahead and wrote her an MRI of the breast in April but asked her to make sure to request how much she will be having to pay out-of-pocket for that test.  If there are any difficulties she will let us know  Otherwise she will see us again in 1 year.  Total encounter time 25 minutes.*   Gustav C. Magrinat, MD 03/28/2021 11:19 AM Medical Oncology [facility] H[facility] [address] [place], [place] [address] Tel. [phone]    Fax. [phone]   This document serves as a record of services personally performed by Gustav Magrinat, MD. It Gay created on his behalf by Katie Daubenspeck, a trained medical scribe. The creation of this record is based on the scribe's personal observations and the provider's statements to them.   I, Gustav Magrinat MD, have reviewed the above documentation for accuracy and completeness, and I agree with the above.   *Total Encounter Time as defined by the Centers for Medicare and Medicaid Services includes, in addition to the face-to-face time of a patient visit (documented in the note above) non-face-to-face time: obtaining and reviewing outside history, ordering and reviewing medications, tests or procedures, care coordination (communications with other health care professionals or caregivers) and documentation in the medical record. 

## 2021-03-28 ENCOUNTER — Inpatient Hospital Stay: Payer: BC Managed Care – PPO

## 2021-03-28 ENCOUNTER — Other Ambulatory Visit: Payer: Self-pay

## 2021-03-28 ENCOUNTER — Inpatient Hospital Stay: Payer: BC Managed Care – PPO | Attending: Oncology | Admitting: Oncology

## 2021-03-28 VITALS — BP 149/80 | HR 73 | Temp 97.7°F | Resp 16 | Ht 62.0 in | Wt 151.5 lb

## 2021-03-28 DIAGNOSIS — C50911 Malignant neoplasm of unspecified site of right female breast: Secondary | ICD-10-CM | POA: Diagnosis not present

## 2021-03-28 DIAGNOSIS — Z9011 Acquired absence of right breast and nipple: Secondary | ICD-10-CM | POA: Insufficient documentation

## 2021-03-28 DIAGNOSIS — Z923 Personal history of irradiation: Secondary | ICD-10-CM | POA: Diagnosis not present

## 2021-03-28 DIAGNOSIS — Z170 Estrogen receptor positive status [ER+]: Secondary | ICD-10-CM | POA: Insufficient documentation

## 2021-03-28 DIAGNOSIS — C50411 Malignant neoplasm of upper-outer quadrant of right female breast: Secondary | ICD-10-CM | POA: Diagnosis present

## 2021-03-28 DIAGNOSIS — Z79811 Long term (current) use of aromatase inhibitors: Secondary | ICD-10-CM | POA: Insufficient documentation

## 2021-03-28 LAB — CMP (CANCER CENTER ONLY)
ALT: 14 U/L (ref 0–44)
AST: 18 U/L (ref 15–41)
Albumin: 4 g/dL (ref 3.5–5.0)
Alkaline Phosphatase: 75 U/L (ref 38–126)
Anion gap: 9 (ref 5–15)
BUN: 12 mg/dL (ref 8–23)
CO2: 27 mmol/L (ref 22–32)
Calcium: 9.8 mg/dL (ref 8.9–10.3)
Chloride: 106 mmol/L (ref 98–111)
Creatinine: 0.8 mg/dL (ref 0.44–1.00)
GFR, Estimated: >60 mL/min
Glucose, Bld: 101 mg/dL — ABNORMAL HIGH (ref 70–99)
Potassium: 3.9 mmol/L (ref 3.5–5.1)
Sodium: 142 mmol/L (ref 135–145)
Total Bilirubin: 0.4 mg/dL (ref 0.3–1.2)
Total Protein: 7.2 g/dL (ref 6.5–8.1)

## 2021-03-28 LAB — CBC WITH DIFFERENTIAL (CANCER CENTER ONLY)
Abs Immature Granulocytes: 0.02 10*3/uL (ref 0.00–0.07)
Basophils Absolute: 0.1 10*3/uL (ref 0.0–0.1)
Basophils Relative: 1 %
Eosinophils Absolute: 0.7 10*3/uL — ABNORMAL HIGH (ref 0.0–0.5)
Eosinophils Relative: 11 %
HCT: 35.8 % — ABNORMAL LOW (ref 36.0–46.0)
Hemoglobin: 11.6 g/dL — ABNORMAL LOW (ref 12.0–15.0)
Immature Granulocytes: 0 %
Lymphocytes Relative: 16 %
Lymphs Abs: 0.9 10*3/uL (ref 0.7–4.0)
MCH: 28.3 pg (ref 26.0–34.0)
MCHC: 32.4 g/dL (ref 30.0–36.0)
MCV: 87.3 fL (ref 80.0–100.0)
Monocytes Absolute: 0.5 10*3/uL (ref 0.1–1.0)
Monocytes Relative: 9 %
Neutro Abs: 3.6 10*3/uL (ref 1.7–7.7)
Neutrophils Relative %: 63 %
Platelet Count: 201 10*3/uL (ref 150–400)
RBC: 4.1 MIL/uL (ref 3.87–5.11)
RDW: 13.3 % (ref 11.5–15.5)
WBC Count: 5.7 10*3/uL (ref 4.0–10.5)
nRBC: 0 % (ref 0.0–0.2)

## 2021-03-28 MED ORDER — ANASTROZOLE 1 MG PO TABS: 1.0000 mg | ORAL_TABLET | Freq: Every day | ORAL | 6 refills | Status: DC

## 2021-04-21 ENCOUNTER — Other Ambulatory Visit: Payer: Self-pay | Admitting: Oncology

## 2021-06-06 ENCOUNTER — Other Ambulatory Visit: Payer: Self-pay | Admitting: Oncology

## 2021-06-06 ENCOUNTER — Ambulatory Visit (INDEPENDENT_AMBULATORY_CARE_PROVIDER_SITE_OTHER): Payer: Medicare Other

## 2021-06-06 VITALS — Ht 61.0 in | Wt 151.0 lb

## 2021-06-06 DIAGNOSIS — Z0000 Encounter for general adult medical examination without abnormal findings: Secondary | ICD-10-CM | POA: Diagnosis not present

## 2021-06-06 NOTE — Patient Instructions (Addendum)
[name] , Thank you for taking time to come for your Medicare Wellness Visit. I appreciate your ongoing commitment to your health goals. Please review the following plan we discussed and let me know if I can assist you in the future.   These are the goals we discussed:  Goals      Increase physical activity     Patient joined [employer].        This is a list of the screening recommended for you and due dates:  Health Maintenance  Topic Date Due   COVID-19 Vaccine (1) 06/22/2021*   Flu Shot  08/19/2021*   Zoster (Shingles) Vaccine (1 of 2) 09/04/2021*   Pneumonia Vaccine (2 - PPSV23 if available, else PCV20) 06/06/2022*   Colon Cancer Screening  06/06/2022*   Tetanus Vaccine  06/06/2022*   Mammogram  03/05/2023   DEXA scan (bone density measurement)  Completed   Hepatitis C Screening: USPSTF Recommendation to screen - Ages 49-79 yo.  Completed   HPV Vaccine  Aged Out  *Topic was postponed. The date shown is not the original due date.   Advanced directives: No  Conditions/risks identified: None  Next appointment: Follow up in one year for your annual wellness visit    Preventive Care 69 Years and Older, Female Preventive care refers to lifestyle choices and visits with your health care provider that can promote health and wellness. What does preventive care include? A yearly physical exam. This is also called an annual well check. Dental exams once or twice a year. Routine eye exams. Ask your health care provider how often you should have your eyes checked. Personal lifestyle choices, including: Daily care of your teeth and gums. Regular physical activity. Eating a healthy diet. Avoiding tobacco and drug use. Limiting alcohol use. Practicing safe sex. Taking low-dose aspirin every day. Taking vitamin and mineral supplements as recommended by your health care provider. What happens during an annual well check? The services and screenings done by your health care provider  during your annual well check will depend on your age, overall health, lifestyle risk factors, and family history of disease. Counseling  Your health care provider may ask you questions about your: Alcohol use. Tobacco use. Drug use. Emotional well-being. Home and relationship well-being. Sexual activity. Eating habits. History of falls. Memory and ability to understand (cognition). Work and work Statistician. Reproductive health. Screening  You may have the following tests or measurements: Height, weight, and BMI. Blood pressure. Lipid and cholesterol levels. These may be checked every 5 years, or more frequently if you are over 92 years old. Skin check. Lung cancer screening. You may have this screening every year starting at age 38 if you have a 30-pack-year history of smoking and currently smoke or have quit within the past 15 years. Fecal occult blood test (FOBT) of the stool. You may have this test every year starting at age 93. Flexible sigmoidoscopy or colonoscopy. You may have a sigmoidoscopy every 5 years or a colonoscopy every 10 years starting at age 70. Hepatitis C blood test. Hepatitis B blood test. Sexually transmitted disease (STD) testing. Diabetes screening. This is done by checking your blood sugar (glucose) after you have not eaten for a while (fasting). You may have this done every 1-3 years. Bone density scan. This is done to screen for osteoporosis. You may have this done starting at age 35. Mammogram. This may be done every 1-2 years. Talk to your health care provider about how often you should  have regular mammograms. Talk with your health care provider about your test results, treatment options, and if necessary, the need for more tests. Vaccines  Your health care provider may recommend certain vaccines, such as: Influenza vaccine. This is recommended every year. Tetanus, diphtheria, and acellular pertussis (Tdap, Td) vaccine. You may need a Td booster every  10 years. Zoster vaccine. You may need this after age 36. Pneumococcal 13-valent conjugate (PCV13) vaccine. One dose is recommended after age 61. Pneumococcal polysaccharide (PPSV23) vaccine. One dose is recommended after age 15. Talk to your health care provider about which screenings and vaccines you need and how often you need them. This information is not intended to replace advice given to you by your health care provider. Make sure you discuss any questions you have with your health care provider. Document Released: 06/04/2015 Document Revised: 01/26/2016 Document Reviewed: 03/09/2015 Elsevier Interactive Patient Education  2017 [employer] Prevention in the Home Falls can cause injuries. They can happen to people of all ages. There are many things you can do to make your home safe and to help prevent falls. What can I do on the outside of my home? Regularly fix the edges of walkways and driveways and fix any cracks. Remove anything that might make you trip as you walk through a door, such as a raised step or threshold. Trim any bushes or trees on the path to your home. Use bright outdoor lighting. Clear any walking paths of anything that might make someone trip, such as rocks or tools. Regularly check to see if handrails are loose or broken. Make sure that both sides of any steps have handrails. Any raised decks and porches should have guardrails on the edges. Have any leaves, snow, or ice cleared regularly. Use sand or salt on walking paths during winter. Clean up any spills in your garage right away. This includes oil or grease spills. What can I do in the bathroom? Use night lights. Install grab bars by the toilet and in the tub and shower. Do not use towel bars as grab bars. Use non-skid mats or decals in the tub or shower. If you need to sit down in the shower, use a plastic, non-slip stool. Keep the floor dry. Clean up any water that spills on the floor as soon as it  happens. Remove soap buildup in the tub or shower regularly. Attach bath mats securely with double-sided non-slip rug tape. Do not have throw rugs and other things on the floor that can make you trip. What can I do in the bedroom? Use night lights. Make sure that you have a light by your bed that is easy to reach. Do not use any sheets or blankets that are too big for your bed. They should not hang down onto the floor. Have a firm chair that has side arms. You can use this for support while you get dressed. Do not have throw rugs and other things on the floor that can make you trip. What can I do in the kitchen? Clean up any spills right away. Avoid walking on wet floors. Keep items that you use a lot in easy-to-reach places. If you need to reach something above you, use a strong step stool that has a grab bar. Keep electrical cords out of the way. Do not use floor polish or wax that makes floors slippery. If you must use wax, use non-skid floor wax. Do not have throw rugs and other things on the floor  that can make you trip. What can I do with my stairs? Do not leave any items on the stairs. Make sure that there are handrails on both sides of the stairs and use them. Fix handrails that are broken or loose. Make sure that handrails are as long as the stairways. Check any carpeting to make sure that it is firmly attached to the stairs. Fix any carpet that is loose or worn. Avoid having throw rugs at the top or bottom of the stairs. If you do have throw rugs, attach them to the floor with carpet tape. Make sure that you have a light switch at the top of the stairs and the bottom of the stairs. If you do not have them, ask someone to add them for you. What else can I do to help prevent falls? Wear shoes that: Do not have high heels. Have rubber bottoms. Are comfortable and fit you well. Are closed at the toe. Do not wear sandals. If you use a stepladder: Make sure that it is fully opened.  Do not climb a closed stepladder. Make sure that both sides of the stepladder are locked into place. Ask someone to hold it for you, if possible. Clearly mark and make sure that you can see: Any grab bars or handrails. First and last steps. Where the edge of each step is. Use tools that help you move around (mobility aids) if they are needed. These include: Canes. Walkers. Scooters. Crutches. Turn on the lights when you go into a dark area. Replace any light bulbs as soon as they burn out. Set up your furniture so you have a clear path. Avoid moving your furniture around. If any of your floors are uneven, fix them. If there are any pets around you, be aware of where they are. Review your medicines with your doctor. Some medicines can make you feel dizzy. This can increase your chance of falling. Ask your doctor what other things that you can do to help prevent falls. This information is not intended to replace advice given to you by your health care provider. Make sure you discuss any questions you have with your health care provider. Document Released: 03/04/2009 Document Revised: 10/14/2015 Document Reviewed: 06/12/2014 Elsevier Interactive Patient Education  2017 [employer].

## 2021-06-06 NOTE — Progress Notes (Signed)
Subjective:   Terri Gay is a 71 y.o. female who presents for Medicare Annual (Subsequent) preventive examination.  Review of Systems    No ROS Cardiac Risk Factors include: advanced age (>41men, >52 women);hypertension    Objective:    Today's Vitals   06/06/21 0951  Weight: 151 lb (68.5 kg)  Height: 5\' 1"  (1.549 m)   Body mass index is 28.53 kg/m.  Advanced Directives 06/06/2021 07/12/2020 06/09/2020 06/01/2020  Does Patient Have a Medical Advance Directive? No No No No  Would patient like information on creating a medical advance directive? No - Patient declined No - Patient declined No - Patient declined No - Patient declined    Current Medications (verified) Outpatient Encounter Medications as of 06/06/2021  Medication Sig   acetaminophen (TYLENOL) 500 MG tablet Take 500 mg by mouth every 6 (six) hours as needed.   anastrozole (ARIMIDEX) 1 MG tablet Take 1 tablet by mouth once daily   Bioflavonoid Products (VITAMIN C PLUS) 1000 MG TABS Vitamin C   bisoprolol (ZEBETA) 10 MG tablet Take 1 tablet (10 mg total) by mouth daily.   BLACK ELDERBERRY,BERRY-FLOWER, PO Take by mouth.   Cetirizine HCl (ZYRTEC ALLERGY) 10 MG CAPS    Cholecalciferol (VITAMIN D-3) 5000 UNITS TABS Take 5,000 Units by mouth daily.   clonazePAM (KLONOPIN) 1 MG tablet Take 1 mg by mouth every 8 (eight) hours as needed. for anxiety   Coenzyme Q10 (COQ10) 100 MG CAPS Take by mouth.   escitalopram (LEXAPRO) 10 MG tablet Take 10 mg by mouth daily.   hydrochlorothiazide (MICROZIDE) 12.5 MG capsule Take 1 capsule (12.5 mg total) by mouth daily.   magnesium gluconate (MAGONATE) 500 MG tablet Take 500 mg by mouth daily.   temazepam (RESTORIL) 30 MG capsule Take 30 mg by mouth at bedtime as needed.   Zinc 50 MG CAPS zinc   Facility-Administered Encounter Medications as of 06/06/2021  Medication   Sonafine emulsion 1 application    Allergies (verified) Amoxicillin   History: Past Medical History:  Diagnosis  Date   Allergy    seasonal   Anxiety    Arthritis    Breast cancer ([place])    Cancer ([place]) 2021   right breast IDC with DCIS   Depression    Diverticulosis 10/07/2010   Colonoscopy   GERD (gastroesophageal reflux disease)    OTC   Hemorrhoids    History of chicken pox    History of radiation therapy 07/21/2020-09/01/2020   Right breast- Dr. Gery Pray   Hyperlipidemia    Hypertension    Knee pain, left 10/24/2013   Osteopenia 04/25/2013   Osteoporosis    Palpitations 04/26/2013   Preventative health care 04/26/2013   Past Surgical History:  Procedure Laterality Date   MASTECTOMY     MASTECTOMY W/ SENTINEL NODE BIOPSY Right 06/09/2020   Procedure: RIGHT MASTECTOMY WITH RIGHT AXILLARY SENTINEL LYMPH NODE BIOPSY;  Surgeon: Jovita Kussmaul, MD;  Location: [facility];  Service: General;  Laterality: Right;   NO PAST SURGERIES     Family History  Problem Relation Age of Onset   Lung cancer Father        d. 14   Lung cancer Paternal Grandfather        smoker   Diabetes Mother    Arthritis Mother    Hyperlipidemia Mother    Hypertension Mother    Heart disease Mother        CHF   Diabetes Maternal Grandmother  Heart disease Maternal Grandmother    Arthritis Maternal Grandmother    Hyperlipidemia Maternal Grandmother    Hypertension Maternal Grandmother    Hyperlipidemia Sister    Arthritis Paternal Grandmother    Heart disease Daughter        arrythmia, Internal Cardiac Defibrillator   Lupus Son    Drug abuse Son    Colon cancer Neg Hx    Esophageal cancer Neg Hx    Rectal cancer Neg Hx    Stomach cancer Neg Hx    Social History   Socioeconomic History   Marital status: Married    Spouse name: Not on file   Number of children: 2   Years of education: Not on file   Highest education level: Not on file  Occupational History   Occupation: Customer Service    Employer: [employer]    Comment: retired  Tobacco Use   Smoking status: Never    Smokeless tobacco: Never  Substance and Sexual Activity   Alcohol use: Yes    Alcohol/week: 1.0 standard drink    Types: 1 Glasses of wine per week    Comment: occasionally   Drug use: No   Sexual activity: Yes    Birth control/protection: Post-menopausal  Other Topics Concern   Not on file  Social History Narrative   Retired from [employer]    Married for 27 years    Have one son and one daughter ( live in [place])       She likes to dance, read, and play with her dogs.          Caffeine Use: 1 cup coffee and 1 cup tea      Social Determinants of Health   Financial Resource Strain: Low Risk    Difficulty of Paying Living Expenses: Not hard at all  Food Insecurity: No Food Insecurity   Worried About Charity fundraiser in the Last Year: Never true   Ran Out of Food in the Last Year: Never true  Transportation Needs: No Transportation Needs   Lack of Transportation (Medical): No   Lack of Transportation (Non-Medical): No  Physical Activity: Inactive   Days of Exercise per Week: 0 days   Minutes of Exercise per Session: 0 min  Stress: No Stress Concern Present   Feeling of Stress : Not at all  Social Connections: Socially Integrated   Frequency of Communication with Friends and Family: More than three times a week   Frequency of Social Gatherings with Friends and Family: More than three times a week   Attends Religious Services: More than 4 times per year   Active Member of [employer] or Organizations: Yes   Attends Music therapist: More than 4 times per year   Marital Status: Married    Tobacco Counseling Counseling given: Not Answered   Clinical Intake: How often do you need to have someone help you when you read instructions, pamphlets, or other written materials from your doctor or pharmacy?: 1 - Never  Diabetic? No  Activities of Daily Living In your present state of health, do you have any difficulty performing the following activities:  06/06/2021 06/09/2020  Hearing? N N  Vision? N Y  Difficulty concentrating or making decisions? N N  Walking or climbing stairs? N N  Dressing or bathing? N N  Doing errands, shopping? N -  Preparing Food and eating ? N -  Using the Toilet? N -  In the past six months, have  you accidently leaked urine? N -  Do you have problems with loss of bowel control? N -  Managing your Medications? N -  Managing your Finances? N -  Housekeeping or managing your Housekeeping? N -  Some recent data might be hidden    Patient Care Team: Dorothyann Peng, NP as PCP - General (Family Medicine) Dian Queen, MD (Obstetrics and Gynecology) Mauro Kaufmann, RN as Oncology Nurse Navigator Rockwell Germany, RN as Oncology Nurse Navigator Jovita Kussmaul, MD as Consulting Physician (General Surgery) Magrinat, Virgie Dad, MD as Consulting Physician (Oncology) Gery Pray, MD as Consulting Physician (Radiation Oncology)  Indicate any recent Medical Services you may have received from other than [facility] providers in the past year (date may be approximate).     Assessment:   This is a routine wellness examination for [facility].  Hearing/Vision screen Hearing Screening - Comments:: No difficulty hearing Vision Screening - Comments:: Wears glasses. Followed by Dr Jabier Mutton  Dietary issues and exercise activities discussed: Current Exercise Habits: The patient does not participate in regular exercise at present   Goals Addressed             This Visit's Progress    Increase physical activity       Patient joined [employer].       Depression Screen PHQ 2/9 Scores 06/06/2021  PHQ - 2 Score 0    Fall Risk Fall Risk  06/06/2021  Falls in the past year? 0  Number falls in past yr: 0  Injury with Fall? 0    FALL RISK PREVENTION PERTAINING TO THE HOME:  Any stairs in or around the home? Yes  If so, are there any without handrails? No  Home free of loose throw rugs in walkways, pet beds, electrical cords, etc?  Yes  Adequate lighting in your home to reduce risk of falls? Yes   ASSISTIVE DEVICES UTILIZED TO PREVENT FALLS:  Life alert? No  Use of a cane, walker or w/c? No  Grab bars in the bathroom? No  Shower chair or bench in shower? No  Elevated toilet seat or a handicapped toilet? No   TIMED UP AND GO:  Was the test performed? No . Audio Visit  Cognitive Function:     Immunizations Immunization History  Administered Date(s) Administered   Influenza Split 02/19/2010, 03/02/2011, 02/29/2012   Influenza Whole 03/12/2013   Influenza, High Dose Seasonal PF 03/16/2018, 03/01/2019   Influenza,inj,Quad PF,6+ Mos 04/20/2015, 01/31/2016, 02/24/2017   Pneumococcal Conjugate-13 06/20/2018   Td 12/20/2008, 01/08/2009   Zoster, Live 10/24/2013    TDAP status: Due, Education has been provided regarding the importance of this vaccine. Advised may receive this vaccine at local pharmacy or Health Dept. Aware to provide a copy of the vaccination record if obtained from local pharmacy or Health Dept. Verbalized acceptance and understanding.  Flu Vaccine status: Due, Education has been provided regarding the importance of this vaccine. Advised may receive this vaccine at local pharmacy or Health Dept. Aware to provide a copy of the vaccination record if obtained from local pharmacy or Health Dept. Verbalized acceptance and understanding.  Pneumococcal vaccine status: Due, Education has been provided regarding the importance of this vaccine. Advised may receive this vaccine at local pharmacy or Health Dept. Aware to provide a copy of the vaccination record if obtained from local pharmacy or Health Dept. Verbalized acceptance and understanding.  Covid-19 vaccine status: Declined, Education has been provided regarding the importance of this vaccine but patient still declined.  Advised may receive this vaccine at local pharmacy or Health Dept.or vaccine clinic. Aware to provide a copy of the vaccination record  if obtained from local pharmacy or Health Dept. Verbalized acceptance and understanding.  Qualifies for Shingles Vaccine? Yes   Zostavax completed No   Shingrix Completed?: No.    Education has been provided regarding the importance of this vaccine. Patient has been advised to call insurance company to determine out of pocket expense if they have not yet received this vaccine. Advised may also receive vaccine at local pharmacy or Health Dept. Verbalized acceptance and understanding.  Screening Tests Health Maintenance  Topic Date Due   COVID-19 Vaccine (1) 06/22/2021 (Originally 12/29/1950)   INFLUENZA VACCINE  08/19/2021 (Originally 12/20/2020)   Zoster Vaccines- Shingrix (1 of 2) 09/04/2021 (Originally 07/02/1969)   Pneumonia Vaccine 71+ Years old (2 - PPSV23 if available, else PCV20) 06/06/2022 (Originally 06/21/2019)   COLONOSCOPY (Pts 45-63yrs Insurance coverage will need to be confirmed)  06/06/2022 (Originally 10/06/2020)   TETANUS/TDAP  06/06/2022 (Originally 01/09/2019)   MAMMOGRAM  03/05/2023   DEXA SCAN  Completed   Hepatitis C Screening  Completed   HPV VACCINES  Aged Out    Health Maintenance  There are no preventive care reminders to display for this patient.   Additional Screening:   Vision Screening: Recommended annual ophthalmology exams for early detection of glaucoma and other disorders of the eye. Is the patient up to date with their annual eye exam?  Yes  Who is the provider or what is the name of the office in which the patient attends annual eye exams? Followed by Dr Jabier Mutton  Dental Screening: Recommended annual dental exams for proper oral hygiene  Community Resource Referral / Chronic Care Management:  CRR required this visit?  No   CCM required this visit?  No      Plan:     I have personally reviewed and noted the following in the patients chart:   Medical and social history Use of alcohol, tobacco or illicit drugs  Current medications and  supplements including opioid prescriptions.  Functional ability and status Nutritional status Physical activity Advanced directives List of other physicians Hospitalizations, surgeries, and ER visits in previous 12 months Vitals Screenings to include cognitive, depression, and falls Referrals and appointments  In addition, I have reviewed and discussed with patient certain preventive protocols, quality metrics, and best practice recommendations. A written personalized care plan for preventive services as well as general preventive health recommendations were provided to patient.     Criselda Peaches, LPN   6/76/7209

## 2021-06-10 DIAGNOSIS — C50911 Malignant neoplasm of unspecified site of right female breast: Secondary | ICD-10-CM | POA: Diagnosis not present

## 2021-06-13 ENCOUNTER — Ambulatory Visit: Payer: Medicare Other | Attending: General Surgery

## 2021-06-13 ENCOUNTER — Other Ambulatory Visit: Payer: Self-pay

## 2021-06-13 VITALS — Wt 152.2 lb

## 2021-06-13 DIAGNOSIS — Z483 Aftercare following surgery for neoplasm: Secondary | ICD-10-CM | POA: Insufficient documentation

## 2021-06-13 NOTE — Therapy (Signed)
[facility] @ [facility] [place], [place], [address] Phone: [phone]   Fax:  [phone]  Physical Therapy Treatment  Patient Details  Name: Terri Gay MRN: [id number] Date of Birth: Oct 03, 1950 Referring Provider (PT): Dr. Autumn Messing   Encounter Date: 06/13/2021   PT End of Session - 06/13/21 0916     Visit Number 2   # unchanged due to screen only   PT Start Time 9407    PT Stop Time 0918    PT Time Calculation (min) 4 min    Activity Tolerance Patient tolerated treatment well    Behavior During Therapy [facility] for tasks assessed/performed             Past Medical History:  Diagnosis Date   Allergy    seasonal   Anxiety    Arthritis    Breast cancer ([place])    Cancer ([place]) 2021   right breast IDC with DCIS   Depression    Diverticulosis 10/07/2010   Colonoscopy   GERD (gastroesophageal reflux disease)    OTC   Hemorrhoids    History of chicken pox    History of radiation therapy 07/21/2020-09/01/2020   Right breast- Dr. Gery Pray   Hyperlipidemia    Hypertension    Knee pain, left 10/24/2013   Osteopenia 04/25/2013   Osteoporosis    Palpitations 04/26/2013   Preventative health care 04/26/2013    Past Surgical History:  Procedure Laterality Date   MASTECTOMY     MASTECTOMY W/ SENTINEL NODE BIOPSY Right 06/09/2020   Procedure: RIGHT MASTECTOMY WITH RIGHT AXILLARY SENTINEL LYMPH NODE BIOPSY;  Surgeon: Jovita Kussmaul, MD;  Location: [facility];  Service: General;  Laterality: Right;   NO PAST SURGERIES      Vitals:   06/13/21 0915  Weight: 152 lb 4 oz (69.1 kg)     Subjective Assessment - 06/13/21 0915     Subjective Pt returns for her 3 month L-Dex screen.    Pertinent History Patient was diagnosed on 03/01/2020 with right grade II invasive lobular carcinoma breast cancer. She underwent a right mastectomy and sentinel node biopsy (1/6 positive axillary lymph nodes) on 06/09/2020.  It is ER/PR positive and HER2 negative with a Ki67 of 10%.                    L-DEX FLOWSHEETS - 06/13/21 0900       L-DEX LYMPHEDEMA SCREENING   Measurement Type Unilateral    L-DEX MEASUREMENT EXTREMITY Upper Extremity    POSITION  Standing    DOMINANT SIDE Right    At Risk Side Right    BASELINE SCORE (UNILATERAL) -6.1    L-DEX SCORE (UNILATERAL) -4.9    VALUE CHANGE (UNILAT) 1.2                                     PT Long Term Goals - 08/02/20 1204       PT LONG TERM GOAL #1   Title Patient will demonstrate she has regained full shoulder ROM and function post operatively compared ot baselines.    Time 8    Period Weeks    Status Achieved                   Plan - 06/13/21 6808     Clinical Impression Statement Pt returns for her  3 month L-Dex screen. Her change from baseline of 1.2 is WNLs so no further treatment is required at this time except to cont every 3 month L-Dex screens which pt is agreeable to.    PT Treatment/Interventions Dry needling    PT Next Visit Plan Continue L-Dex screens for up to 2 years from her SLNB (06/09/22)    Consulted and Agree with Plan of Care Patient             Patient will benefit from skilled therapeutic intervention in order to improve the following deficits and impairments:     Visit Diagnosis: Aftercare following surgery for neoplasm     Problem List Patient Active Problem List   Diagnosis Date Noted   Cancer of right female breast ([place]) 06/09/2020   Genetic testing 05/17/2020   Malignant neoplasm of upper-outer quadrant of right breast in female, estrogen receptor positive ([place]) 03/11/2020   Knee pain, left 10/24/2013   Palpitations 04/26/2013   Preventative health care 04/26/2013   Osteopenia 04/25/2013   Acute esophagitis 09/19/2012   GERD (gastroesophageal reflux disease) 03/03/2012   Essential hypertension 08/23/2011   Rectal bleeding 06/23/2011   Hyperlipidemia  03/02/2011   Depression with anxiety 01/26/2011   Insomnia 01/26/2011   Diverticulosis 01/17/2011   Hemorrhoids 01/17/2011   Osteoarthritis 01/17/2011    Otelia Limes, PTA 06/13/2021, 9:20 AM  [facility] @ [facility] [place], [place], [address] Phone: [phone]   Fax:  [phone]  Name: Terri Gay MRN: [id number] Date of Birth: 04-03-1951

## 2021-06-22 DIAGNOSIS — C50911 Malignant neoplasm of unspecified site of right female breast: Secondary | ICD-10-CM | POA: Diagnosis not present

## 2021-06-30 DIAGNOSIS — C50911 Malignant neoplasm of unspecified site of right female breast: Secondary | ICD-10-CM | POA: Diagnosis not present

## 2021-07-26 DIAGNOSIS — C50411 Malignant neoplasm of upper-outer quadrant of right female breast: Secondary | ICD-10-CM | POA: Diagnosis not present

## 2021-07-26 DIAGNOSIS — Z170 Estrogen receptor positive status [ER+]: Secondary | ICD-10-CM | POA: Diagnosis not present

## 2021-08-18 DIAGNOSIS — M1712 Unilateral primary osteoarthritis, left knee: Secondary | ICD-10-CM | POA: Diagnosis not present

## 2021-09-12 ENCOUNTER — Ambulatory Visit: Payer: Medicare Other | Attending: General Surgery

## 2021-09-12 VITALS — Wt 152.0 lb

## 2021-09-12 DIAGNOSIS — Z483 Aftercare following surgery for neoplasm: Secondary | ICD-10-CM | POA: Insufficient documentation

## 2021-09-12 NOTE — Therapy (Signed)
?  OUTPATIENT PHYSICAL THERAPY SOZO SCREENING NOTE ? ? ?Patient Name: Terri Gay ?MRN: [id number] ?DOB:[birth year], 71 y.o., female ?Today's Date: 09/12/2021 ? ?PCP: Dorothyann Peng, NP ?REFERRING PROVIDER: Jovita Kussmaul, MD ? ? PT End of Session - 09/12/21 0939   ? ? Visit Number 2   # unchanged due to screen only  ? PT Start Time 0930   ? PT Stop Time 0940   ? PT Time Calculation (min) 10 min   ? Activity Tolerance Patient tolerated treatment well   ? Behavior During Therapy [facility] for tasks assessed/performed   ? ?  ?  ? ?  ? ? ?Past Medical History:  ?Diagnosis Date  ? Allergy   ? seasonal  ? Anxiety   ? Arthritis   ? Breast cancer ([place])   ? Cancer [facility]) 2021  ? right breast IDC with DCIS  ? Depression   ? Diverticulosis 10/07/2010  ? Colonoscopy  ? GERD (gastroesophageal reflux disease)   ? OTC  ? Hemorrhoids   ? History of chicken pox   ? History of radiation therapy 07/21/2020-09/01/2020  ? Right breast- Dr. Gery Pray  ? Hyperlipidemia   ? Hypertension   ? Knee pain, left 10/24/2013  ? Osteopenia 04/25/2013  ? Osteoporosis   ? Palpitations 04/26/2013  ? Preventative health care 04/26/2013  ? ?Past Surgical History:  ?Procedure Laterality Date  ? MASTECTOMY    ? MASTECTOMY W/ SENTINEL NODE BIOPSY Right 06/09/2020  ? Procedure: RIGHT MASTECTOMY WITH RIGHT AXILLARY SENTINEL LYMPH NODE BIOPSY;  Surgeon: Jovita Kussmaul, MD;  Location: [facility];  Service: General;  Laterality: Right;  ? NO PAST SURGERIES    ? ?Patient Active Problem List  ? Diagnosis Date Noted  ? Cancer of right female breast ([place]) 06/09/2020  ? Genetic testing 05/17/2020  ? Malignant neoplasm of upper-outer quadrant of right breast in female, estrogen receptor positive ([place]) 03/11/2020  ? Knee pain, left 10/24/2013  ? Palpitations 04/26/2013  ? Preventative health care 04/26/2013  ? Osteopenia 04/25/2013  ? Acute esophagitis 09/19/2012  ? GERD (gastroesophageal reflux disease) 03/03/2012  ? Essential hypertension 08/23/2011  ? Rectal  bleeding 06/23/2011  ? Hyperlipidemia 03/02/2011  ? Depression with anxiety 01/26/2011  ? Insomnia 01/26/2011  ? Diverticulosis 01/17/2011  ? Hemorrhoids 01/17/2011  ? Osteoarthritis 01/17/2011  ? ? ?REFERRING DIAG: right breast cancer at risk for lymphedema ? ?THERAPY DIAG:  ?Aftercare following surgery for neoplasm ? ?PERTINENT HISTORY: Patient was diagnosed on 03/01/2020 with right grade II invasive lobular carcinoma breast cancer. She underwent a right mastectomy and sentinel node biopsy (1/6 positive axillary lymph nodes) on 06/09/2020. It is ER/PR positive and HER2 negative with a Ki67 of 10%.  ? ?PRECAUTIONS: right UE Lymphedema risk, None ? ?SUBJECTIVE: Pt returns for her 3 month L-Dex screen.  ? ?PAIN:  ?Are you having pain? No ? ?SOZO SCREENING: ?Patient was assessed today using the SOZO machine to determine the lymphedema index score. This was compared to her baseline score. It was determined that she is within the recommended range when compared to her baseline and no further action is needed at this time. She will continue SOZO screenings. These are done every 3 months for 2 years post operatively followed by every 6 months for 2 years, and then annually. ? ? ? ? ?Otelia Limes, PTA ?09/12/2021, 9:43 AM ? ?  ? ?

## 2021-09-29 ENCOUNTER — Encounter: Payer: Self-pay | Admitting: Adult Health

## 2021-09-29 ENCOUNTER — Ambulatory Visit (INDEPENDENT_AMBULATORY_CARE_PROVIDER_SITE_OTHER): Payer: Medicare Other | Admitting: Adult Health

## 2021-09-29 VITALS — BP 112/82 | HR 54 | Temp 98.6°F | Ht 61.5 in | Wt 150.0 lb

## 2021-09-29 DIAGNOSIS — Z0000 Encounter for general adult medical examination without abnormal findings: Secondary | ICD-10-CM

## 2021-09-29 DIAGNOSIS — Z170 Estrogen receptor positive status [ER+]: Secondary | ICD-10-CM | POA: Diagnosis not present

## 2021-09-29 DIAGNOSIS — G4701 Insomnia due to medical condition: Secondary | ICD-10-CM | POA: Diagnosis not present

## 2021-09-29 DIAGNOSIS — C50911 Malignant neoplasm of unspecified site of right female breast: Secondary | ICD-10-CM

## 2021-09-29 DIAGNOSIS — F418 Other specified anxiety disorders: Secondary | ICD-10-CM | POA: Diagnosis not present

## 2021-09-29 DIAGNOSIS — E785 Hyperlipidemia, unspecified: Secondary | ICD-10-CM | POA: Diagnosis not present

## 2021-09-29 DIAGNOSIS — I10 Essential (primary) hypertension: Secondary | ICD-10-CM

## 2021-09-29 LAB — COMPREHENSIVE METABOLIC PANEL WITH GFR
ALT: 14 U/L (ref 0–35)
AST: 18 U/L (ref 0–37)
Albumin: 4.5 g/dL (ref 3.5–5.2)
Alkaline Phosphatase: 62 U/L (ref 39–117)
BUN: 17 mg/dL (ref 6–23)
CO2: 30 meq/L (ref 19–32)
Calcium: 10 mg/dL (ref 8.4–10.5)
Chloride: 104 meq/L (ref 96–112)
Creatinine, Ser: 0.8 mg/dL (ref 0.40–1.20)
GFR: 75.27 mL/min
Glucose, Bld: 90 mg/dL (ref 70–99)
Potassium: 4.2 meq/L (ref 3.5–5.1)
Sodium: 140 meq/L (ref 135–145)
Total Bilirubin: 0.4 mg/dL (ref 0.2–1.2)
Total Protein: 7.2 g/dL (ref 6.0–8.3)

## 2021-09-29 LAB — CBC WITH DIFFERENTIAL/PLATELET
Basophils Absolute: 0.1 10*3/uL (ref 0.0–0.1)
Basophils Relative: 1.3 % (ref 0.0–3.0)
Eosinophils Absolute: 0.5 10*3/uL (ref 0.0–0.7)
Eosinophils Relative: 7.2 % — ABNORMAL HIGH (ref 0.0–5.0)
HCT: 36.2 % (ref 36.0–46.0)
Hemoglobin: 11.9 g/dL — ABNORMAL LOW (ref 12.0–15.0)
Lymphocytes Relative: 22 % (ref 12.0–46.0)
Lymphs Abs: 1.4 10*3/uL (ref 0.7–4.0)
MCHC: 32.9 g/dL (ref 30.0–36.0)
MCV: 87 fl (ref 78.0–100.0)
Monocytes Absolute: 0.5 10*3/uL (ref 0.1–1.0)
Monocytes Relative: 7.1 % (ref 3.0–12.0)
Neutro Abs: 4.1 10*3/uL (ref 1.4–7.7)
Neutrophils Relative %: 62.4 % (ref 43.0–77.0)
Platelets: 184 10*3/uL (ref 150.0–400.0)
RBC: 4.16 Mil/uL (ref 3.87–5.11)
RDW: 13.8 % (ref 11.5–15.5)
WBC: 6.6 10*3/uL (ref 4.0–10.5)

## 2021-09-29 LAB — LIPID PANEL
Cholesterol: 155 mg/dL (ref 0–200)
HDL: 52.5 mg/dL
LDL Cholesterol: 86 mg/dL (ref 0–99)
NonHDL: 102.37
Total CHOL/HDL Ratio: 3
Triglycerides: 83 mg/dL (ref 0.0–149.0)
VLDL: 16.6 mg/dL (ref 0.0–40.0)

## 2021-09-29 LAB — TSH: TSH: 1.16 u[IU]/mL (ref 0.35–5.50)

## 2021-09-29 MED ORDER — BISOPROLOL FUMARATE 10 MG PO TABS: 10.0000 mg | ORAL_TABLET | Freq: Every day | ORAL | 3 refills | Status: DC

## 2021-09-29 MED ORDER — HYDROCHLOROTHIAZIDE 12.5 MG PO CAPS: 12.5000 mg | ORAL_CAPSULE | Freq: Every day | ORAL | 3 refills | Status: DC

## 2021-09-29 NOTE — Progress Notes (Signed)
? ?Subjective:  ? ? Patient ID: Terri Gay, female    DOB: 28-Nov-1950, 71 y.o.   MRN: [id number] ? ?HPI ?Patient presents for yearly preventative medicine examination. He is a pleasant 71 year old female who  has a past medical history of Allergy, Anxiety, Arthritis, Breast cancer ([place]), Cancer ([employer]) (2021), Depression, Diverticulosis (10/07/2010), GERD (gastroesophageal reflux disease), Hemorrhoids, History of chicken pox, History of radiation therapy (07/21/2020-09/01/2020), Hyperlipidemia, Hypertension, Knee pain, left (10/24/2013), Osteopenia (04/25/2013), Osteoporosis, Palpitations (04/26/2013), and Preventative health care (04/26/2013). ? ?Hypertension-controlled well with Zebeta 10 mg daily and a HCTZ 12.5 mg daily.  He denies dizziness, lightheadedness, chest pain, headaches, shortness of breath, or syncopal episodes ?BP Readings from Last 3 Encounters:  ?09/29/21 112/82  ?03/28/21 (!) 149/80  ?09/27/20 (!) 145/83  ? ?Anxiety/depression/insomnia-takes Klonopin 0.5 mg, 1 tablet in the morning and 2 tablets at bedtime as needed.  She is also prescribed Lexapro 10 mg daily and restoral 30 mg by oncology ? ?Hyperlipidemia-takes Lipitor -10 mg every day.  She denies myalgia or fatigue. She took a break from that lipitor for about 6 months due to joint pain. Restarted about 2 weeks ago.  ?Lab Results  ?Component Value Date  ? CHOL 155 09/14/2020  ? HDL 52.30 09/14/2020  ? Joffre 89 09/14/2020  ? TRIG 66.0 09/14/2020  ? CHOLHDL 3 09/14/2020  ? ?H/o breast cancer -diagnosed in October 2021.  She underwent a right mastectomy with right axillary sentinel lymph node biopsy in January 2022.  Completed 30 rounds of radiation therapy.  Currently taking Arimidex 1 mg daily. ? ? ?All immunizations and health maintenance protocols were reviewed with the patient and needed orders were placed. ? ?Appropriate screening laboratory values were ordered for the patient including screening of hyperlipidemia, renal function and hepatic  function. ?If indicated by BPH, a PSA was ordered. ? ?Medication reconciliation,  past medical history, social history, problem list and allergies were reviewed in detail with the patient ? ?Goals were established with regard to weight loss, exercise, and  diet in compliance with medications. She is going to the gym 3-4 times a week and is eating healthy  ?Wt Readings from Last 3 Encounters:  ?09/29/21 150 lb (68 kg)  ?09/12/21 152 lb (68.9 kg)  ?06/13/21 152 lb 4 oz (69.1 kg)  ? ?She is up-to-date on routine colon cancer screening ? ?Review of Systems  ?Constitutional: Negative.   ?HENT: Negative.    ?Eyes: Negative.   ?Respiratory: Negative.    ?Cardiovascular: Negative.   ?Gastrointestinal: Negative.   ?Endocrine: Negative.   ?Genitourinary: Negative.   ?Musculoskeletal: Negative.   ?Skin: Negative.   ?Allergic/Immunologic: Negative.   ?Neurological: Negative.   ?Hematological: Negative.   ?Psychiatric/Behavioral: Negative.    ? ?Past Medical History:  ?Diagnosis Date  ? Allergy   ? seasonal  ? Anxiety   ? Arthritis   ? Breast cancer ([place])   ? Cancer [facility]) 2021  ? right breast IDC with DCIS  ? Depression   ? Diverticulosis 10/07/2010  ? Colonoscopy  ? GERD (gastroesophageal reflux disease)   ? OTC  ? Hemorrhoids   ? History of chicken pox   ? History of radiation therapy 07/21/2020-09/01/2020  ? Right breast- Dr. Gery Pray  ? Hyperlipidemia   ? Hypertension   ? Knee pain, left 10/24/2013  ? Osteopenia 04/25/2013  ? Osteoporosis   ? Palpitations 04/26/2013  ? Preventative health care 04/26/2013  ? ? ?Social History  ? ?Socioeconomic History  ? Marital status:  Married  ?  Spouse name: Not on file  ? Number of children: 2  ? Years of education: Not on file  ? Highest education level: Not on file  ?Occupational History  ? Occupation: Therapist, art  ?  Employer: [employer]  ?  Comment: retired  ?Tobacco Use  ? Smoking status: Never  ? Smokeless tobacco: Never  ?Substance and Sexual Activity  ? Alcohol use:  Yes  ?  Alcohol/week: 1.0 standard drink  ?  Types: 1 Glasses of wine per week  ?  Comment: occasionally  ? Drug use: No  ? Sexual activity: Yes  ?  Birth control/protection: Post-menopausal  ?Other Topics Concern  ? Not on file  ?Social History Narrative  ? Retired from [employer]   ? Married for 27 years   ? Have one son and one daughter ( live in [place])   ?   ? She likes to dance, read, and play with her dogs.   ?   ?   ? Caffeine Use: 1 cup coffee and 1 cup tea  ?   ? ?Social Determinants of Health  ? ?Financial Resource Strain: Low Risk   ? Difficulty of Paying Living Expenses: Not hard at all  ?Food Insecurity: No Food Insecurity  ? Worried About Charity fundraiser in the Last Year: Never true  ? Ran Out of Food in the Last Year: Never true  ?Transportation Needs: No Transportation Needs  ? Lack of Transportation (Medical): No  ? Lack of Transportation (Non-Medical): No  ?Physical Activity: Inactive  ? Days of Exercise per Week: 0 days  ? Minutes of Exercise per Session: 0 min  ?Stress: No Stress Concern Present  ? Feeling of Stress : Not at all  ?Social Connections: Socially Integrated  ? Frequency of Communication with Friends and Family: More than three times a week  ? Frequency of Social Gatherings with Friends and Family: More than three times a week  ? Attends Religious Services: More than 4 times per year  ? Active Member of Clubs or Organizations: Yes  ? Attends Archivist Meetings: More than 4 times per year  ? Marital Status: Married  ?Intimate Partner Violence: Not At Risk  ? Fear of Current or Ex-Partner: No  ? Emotionally Abused: No  ? Physically Abused: No  ? Sexually Abused: No  ? ? ?Past Surgical History:  ?Procedure Laterality Date  ? MASTECTOMY    ? MASTECTOMY W/ SENTINEL NODE BIOPSY Right 06/09/2020  ? Procedure: RIGHT MASTECTOMY WITH RIGHT AXILLARY SENTINEL LYMPH NODE BIOPSY;  Surgeon: Jovita Kussmaul, MD;  Location: [facility];  Service: General;   Laterality: Right;  ? NO PAST SURGERIES    ? ? ?Family History  ?Problem Relation Age of Onset  ? Lung cancer Father   ?     d. 43  ? Lung cancer Paternal Grandfather   ?     smoker  ? Diabetes Mother   ? Arthritis Mother   ? Hyperlipidemia Mother   ? Hypertension Mother   ? Heart disease Mother   ?     CHF  ? Diabetes Maternal Grandmother   ? Heart disease Maternal Grandmother   ? Arthritis Maternal Grandmother   ? Hyperlipidemia Maternal Grandmother   ? Hypertension Maternal Grandmother   ? Hyperlipidemia Sister   ? Arthritis Paternal Grandmother   ? Heart disease Daughter   ?     arrythmia, Internal Cardiac Defibrillator  ?  Lupus Son   ? Drug abuse Son   ? Colon cancer Neg Hx   ? Esophageal cancer Neg Hx   ? Rectal cancer Neg Hx   ? Stomach cancer Neg Hx   ? ? ?Allergies  ?Allergen Reactions  ? Amoxicillin Diarrhea  ? ? ?Current Outpatient Medications on File Prior to Visit  ?Medication Sig Dispense Refill  ? acetaminophen (TYLENOL) 500 MG tablet Take 500 mg by mouth every 6 (six) hours as needed.    ? anastrozole (ARIMIDEX) 1 MG tablet Take 1 tablet by mouth once daily 90 tablet 0  ? atorvastatin (LIPITOR) 10 MG tablet Take 10 mg by mouth daily.    ? Bioflavonoid Products (VITAMIN C PLUS) 1000 MG TABS Vitamin C    ? bisoprolol (ZEBETA) 10 MG tablet Take 1 tablet (10 mg total) by mouth daily. 90 tablet 3  ? BLACK ELDERBERRY,BERRY-FLOWER, PO Take by mouth.    ? Cetirizine HCl (ZYRTEC ALLERGY) 10 MG CAPS     ? clonazePAM (KLONOPIN) 1 MG tablet Take 1 mg by mouth every 8 (eight) hours as needed. for anxiety    ? Coenzyme Q10 (COQ10) 100 MG CAPS Take by mouth.    ? escitalopram (LEXAPRO) 10 MG tablet Take 10 mg by mouth daily.    ? hydrochlorothiazide (MICROZIDE) 12.5 MG capsule Take 1 capsule (12.5 mg total) by mouth daily. 90 capsule 3  ? magnesium gluconate (MAGONATE) 500 MG tablet Take 500 mg by mouth daily.    ? temazepam (RESTORIL) 30 MG capsule Take 30 mg by mouth at bedtime as needed.    ? Zinc 50 MG CAPS  zinc    ? Cholecalciferol 50 MCG (2000 UT) TABS Take by mouth.    ? Glucosamine Sulfate 750 MG TABS Take by mouth.    ? ibuprofen (ADVIL) 600 MG tablet Take 1 tablet by mouth every 6 (six) hours as needed.    ? m

## 2021-10-06 ENCOUNTER — Telehealth: Payer: Self-pay | Admitting: Adult Health

## 2021-10-06 NOTE — Telephone Encounter (Signed)
Labs mailed

## 2021-10-06 NOTE — Telephone Encounter (Signed)
Pt would like a copy of her labs to be mail to home address

## 2021-11-20 ENCOUNTER — Other Ambulatory Visit: Payer: Self-pay | Admitting: Adult Health

## 2022-01-11 DIAGNOSIS — C50911 Malignant neoplasm of unspecified site of right female breast: Secondary | ICD-10-CM | POA: Diagnosis not present

## 2022-01-16 ENCOUNTER — Ambulatory Visit: Payer: Medicare Other | Attending: General Surgery

## 2022-01-16 VITALS — Wt 153.2 lb

## 2022-01-16 DIAGNOSIS — Z483 Aftercare following surgery for neoplasm: Secondary | ICD-10-CM | POA: Insufficient documentation

## 2022-01-16 NOTE — Therapy (Signed)
OUTPATIENT PHYSICAL THERAPY SOZO SCREENING NOTE   Patient Name: Terri Gay MRN: [id number] DOB:1951/02/15, 71 y.o., female Today's Date: 01/16/2022  PCP: Dorothyann Peng, NP REFERRING PROVIDER: Jovita Kussmaul, MD   PT End of Session - 01/16/22 [phone]     Visit Number 2   # unchanged due to screen only   PT Start Time 0940    PT Stop Time 0953    PT Time Calculation (min) 13 min    Activity Tolerance Patient tolerated treatment well    Behavior During Therapy [facility] for tasks assessed/performed             Past Medical History:  Diagnosis Date   Allergy    seasonal   Anxiety    Arthritis    Breast cancer ([place])    Cancer ([place]) 2021   right breast IDC with DCIS   Depression    Diverticulosis 10/07/2010   Colonoscopy   GERD (gastroesophageal reflux disease)    OTC   Hemorrhoids    History of chicken pox    History of radiation therapy 07/21/2020-09/01/2020   Right breast- Dr. Gery Pray   Hyperlipidemia    Hypertension    Knee pain, left 10/24/2013   Osteopenia 04/25/2013   Osteoporosis    Palpitations 04/26/2013   Preventative health care 04/26/2013   Past Surgical History:  Procedure Laterality Date   MASTECTOMY     MASTECTOMY W/ SENTINEL NODE BIOPSY Right 06/09/2020   Procedure: RIGHT MASTECTOMY WITH RIGHT AXILLARY SENTINEL LYMPH NODE BIOPSY;  Surgeon: Jovita Kussmaul, MD;  Location: [place];  Service: General;  Laterality: Right;   NO PAST SURGERIES     Patient Active Problem List   Diagnosis Date Noted   Cancer of right female breast ([place]) 06/09/2020   Genetic testing 05/17/2020   Malignant neoplasm of upper-outer quadrant of right breast in female, estrogen receptor positive ([place]) 03/11/2020   Knee pain, left 10/24/2013   Palpitations 04/26/2013   Preventative health care 04/26/2013   Osteopenia 04/25/2013   Acute esophagitis 09/19/2012   GERD (gastroesophageal reflux disease) 03/03/2012   Essential hypertension 08/23/2011   Rectal  bleeding 06/23/2011   Hyperlipidemia 03/02/2011   Depression with anxiety 01/26/2011   Insomnia 01/26/2011   Diverticulosis 01/17/2011   Hemorrhoids 01/17/2011   Osteoarthritis 01/17/2011    REFERRING DIAG: right breast cancer at risk for lymphedema  THERAPY DIAG: Aftercare following surgery for neoplasm  PERTINENT HISTORY: Patient was diagnosed on 03/01/2020 with right grade II invasive lobular carcinoma breast cancer. She underwent a right mastectomy and sentinel node biopsy (1/6 positive axillary lymph nodes) on 06/09/2020. It is ER/PR positive and HER2 negative with a Ki67 of 10%.   PRECAUTIONS: right UE Lymphedema risk, None  SUBJECTIVE: Pt returns for her 3 month L-Dex screen. "I'm taking an international 12 hr flight in November. Can you help me get a compression sleeve? "   PAIN:  Are you having pain? No  SOZO SCREENING: Patient was assessed today using the SOZO machine to determine the lymphedema index score. This was compared to her baseline score. It was determined that she is within the recommended range when compared to her baseline and no further action is needed at this time. She will continue SOZO screenings. These are done every 3 months for 2 years post operatively followed by every 6 months for 2 years, and then annually.    Terri Gay, PT had a cancellation so was able to measure pt for a  [employer] and gave her info on how to order online.    L-DEX FLOWSHEETS - 01/16/22 0900       L-DEX LYMPHEDEMA SCREENING   Measurement Type Unilateral    L-DEX MEASUREMENT EXTREMITY Upper Extremity    POSITION  Standing    DOMINANT SIDE Right    At Risk Side Right    BASELINE SCORE (UNILATERAL) -6.1    L-DEX SCORE (UNILATERAL) -6.5    VALUE CHANGE (UNILAT) -0.4               Terri Gay, PTA 01/16/2022, 9:48 AM

## 2022-01-19 DIAGNOSIS — C50911 Malignant neoplasm of unspecified site of right female breast: Secondary | ICD-10-CM | POA: Diagnosis not present

## 2022-01-31 ENCOUNTER — Other Ambulatory Visit: Payer: Self-pay | Admitting: Obstetrics and Gynecology

## 2022-01-31 DIAGNOSIS — Z1231 Encounter for screening mammogram for malignant neoplasm of breast: Secondary | ICD-10-CM

## 2022-02-12 ENCOUNTER — Other Ambulatory Visit: Payer: Self-pay | Admitting: Adult Health

## 2022-03-07 ENCOUNTER — Ambulatory Visit
Admission: RE | Admit: 2022-03-07 | Discharge: 2022-03-07 | Disposition: A | Discharge status: Home / Self Care | Payer: Medicare Other | Source: Ambulatory Visit | Attending: Obstetrics and Gynecology | Admitting: Obstetrics and Gynecology

## 2022-03-07 DIAGNOSIS — Z1231 Encounter for screening mammogram for malignant neoplasm of breast: Secondary | ICD-10-CM | POA: Diagnosis not present

## 2022-03-15 DIAGNOSIS — Z853 Personal history of malignant neoplasm of breast: Secondary | ICD-10-CM | POA: Diagnosis not present

## 2022-03-21 NOTE — Progress Notes (Signed)
Patient Care Team: Dorothyann Peng, NP as PCP - General (Family Medicine) Dian Queen, MD (Obstetrics and Gynecology) Mauro Kaufmann, RN as Oncology Nurse Navigator Rockwell Germany, RN as Oncology Nurse Navigator Jovita Kussmaul, MD as Consulting Physician (General Surgery) Magrinat, Virgie Dad, MD (Inactive) as Consulting Physician (Oncology) Gery Pray, MD as Consulting Physician (Radiation Oncology)  DIAGNOSIS:  Encounter Diagnosis  Name Primary?   Malignant neoplasm of upper-outer quadrant of right breast in female, estrogen receptor positive ([place]) Yes    SUMMARY OF ONCOLOGIC HISTORY: Oncology History  Malignant neoplasm of upper-outer quadrant of right breast in female, estrogen receptor positive ([place])  03/11/2020 Initial Diagnosis   Malignant neoplasm of upper-outer quadrant of right breast in female, estrogen receptor positive ([place])   05/16/2020 Genetic Testing   Negative genetic testing.  EGFR c.3629C>T (p.Ala1210Val) and NTHL1 c.755C>G (p.Thr252Ser)  VUS identified.  The Multi-Gene Panel offered by [facility] includes sequencing and/or deletion duplication testing of the following 84 genes: AIP, ALK, APC, ATM, AXIN2,BAP1,  BARD1, BLM, BMPR1A, BRCA1, BRCA2, BRIP1, CASR, CDC73, CDH1, CDK4, CDKN1B, CDKN1C, CDKN2A (p14ARF), CDKN2A (p16INK4a), CEBPA, CHEK2, CTNNA1, DICER1, DIS3L2, EGFR (c.2369C>T, p.Thr790Met variant only), EPCAM (Deletion/duplication testing only), FH, FLCN, GATA2, GPC3, GREM1 (Promoter region deletion/duplication testing only), HOXB13 (c.251G>A, p.Gly84Glu), HRAS, KIT, MAX, MEN1, MET, MITF (c.952G>A, p.Glu318Lys variant only), MLH1, MSH2, MSH3, MSH6, MUTYH, NBN, NF1, NF2, NTHL1, PALB2, PDGFRA, PHOX2B, PMS2, POLD1, POLE, POT1, PRKAR1A, PTCH1, PTEN, RAD50, RAD51C, RAD51D, RB1, RECQL4, RET, RUNX1, SDHAF2, SDHA (sequence changes only), SDHB, SDHC, SDHD, SMAD4, SMARCA4, SMARCB1, SMARCE1, STK11, SUFU, TERC, TERT, TMEM127, TP53, TSC1, TSC2, VHL, WRN and WT1.  The report  date is May 16, 2020.     CHIEF COMPLIANT: Follow-up right breast cancer surveillance currently on anastrozole  INTERVAL HISTORY: Terri Gay is a 71 y.o with the above-mentioned right breast cancer surveillance currently on anastrozole. She presents to the clinic for a follow-up. Establish oncology care with Dr. Lindi Adie. She states that she is hot all the time and it seems like she can't loose any weight. But overall she is tolerating the anastrozole. [facility] and then her husband got it, so she has not resumed.    ALLERGIES:  is allergic to amoxicillin.  MEDICATIONS:  Current Outpatient Medications  Medication Sig Dispense Refill   acetaminophen (TYLENOL) 500 MG tablet Take 500 mg by mouth every 6 (six) hours as needed.     atorvastatin (LIPITOR) 10 MG tablet Take 1 tablet by mouth once daily 90 tablet 0   Bioflavonoid Products (VITAMIN C PLUS) 1000 MG TABS Vitamin C     bisoprolol (ZEBETA) 10 MG tablet Take 1 tablet (10 mg total) by mouth daily. 90 tablet 3   BLACK ELDERBERRY,BERRY-FLOWER, PO Take by mouth.     Cetirizine HCl (ZYRTEC ALLERGY) 10 MG CAPS      Cholecalciferol 50 MCG (2000 UT) TABS Take by mouth.     clonazePAM (KLONOPIN) 1 MG tablet Take 1 mg by mouth every 8 (eight) hours as needed. for anxiety     Coenzyme Q10 (COQ10) 100 MG CAPS Take by mouth.     escitalopram (LEXAPRO) 10 MG tablet Take 10 mg by mouth daily.     Glucosamine Sulfate 750 MG TABS Take by mouth.     hydrochlorothiazide (MICROZIDE) 12.5 MG capsule Take 1 capsule (12.5 mg total) by mouth daily. 90 capsule 3   ibuprofen (ADVIL) 600 MG tablet Take 1 tablet by mouth every  6 (six) hours as needed.     magnesium gluconate (MAGONATE) 500 MG tablet Take 500 mg by mouth daily.     meloxicam (MOBIC) 15 MG tablet Take 15 mg by mouth daily.     temazepam (RESTORIL) 30 MG capsule Take 30 mg by mouth at bedtime as needed.     Zinc 50 MG CAPS zinc     anastrozole (ARIMIDEX) 1  MG tablet Take 1 tablet (1 mg total) by mouth daily. 90 tablet 3   No current facility-administered medications for this visit.   Facility-Administered Medications Ordered in Other Visits  Medication Dose Route Frequency Provider Last Rate Last Admin   Sonafine emulsion 1 application  1 application  Topical Once Gery Pray, MD        PHYSICAL EXAMINATION: ECOG PERFORMANCE STATUS: 1 - Symptomatic but completely ambulatory  Vitals:   03/28/22 1013  BP: 129/79  Pulse: 65  Resp: 18  Temp: 97.8 F (36.6 C)  SpO2: 97%   Filed Weights   03/28/22 1013  Weight: 155 lb (70.3 kg)      LABORATORY DATA:  I have reviewed the data as listed    Latest Ref Rng & Units 03/28/2022    9:53 AM 09/29/2021   10:17 AM 03/28/2021    9:20 AM  CMP  Glucose 70 - 99 mg/dL 93  90  101   BUN 8 - 23 mg/dL _0 Creatinine 0.44 - 1.00 mg/dL 0.87  0.80  0.80   Sodium 135 - 145 mmol/L 140  140  142   Potassium 3.5 - 5.1 mmol/L 3.8  4.2  3.9   Chloride 98 - 111 mmol/L 105  104  106   CO2 22 - 32 mmol/L _1 Calcium 8.9 - 10.3 mg/dL 9.9  10.0  9.8   Total Protein 6.5 - 8.1 g/dL 7.1  7.2  7.2   Total Bilirubin 0.3 - 1.2 mg/dL 0.4  0.4  0.4   Alkaline Phos 38 - 126 U/L 66  62  75   AST 15 - 41 U/L _2 ALT 0 - 44 U/L _3 Lab Results  Component Value Date   WBC 7.6 03/28/2022   HGB 12.1 03/28/2022   HCT 37.4 03/28/2022   MCV 87.8 03/28/2022   PLT 213 03/28/2022   NEUTROABS 4.8 03/28/2022    ASSESSMENT & PLAN:  Malignant neoplasm of upper-outer quadrant of right breast in female, estrogen receptor positive ([place]) Dr. Jana Hakim patient establishing oncology care with me 03/09/2020: Right breast UOQ: Stage Ia ILC ER/PR positive HER2 negative Ki-67 10% 06/09/2020: Right mastectomy: T3N0 I plus stage IIa grade 2 ILC with negative margins 1 lymph node with isolated tumor cells out of 7 Oncotype DX: Score 10: Risk of distant recurrence 3% 09/01/2020: Completed  adjuvant radiation 05/16/2020: Genetics: Negative  Current treatment: Anastrozole started November 2021 Anastrozole toxicities: Weight gain: We discussed at length about measures to decrease her weight Feeling hot Joint stiffness: Encouraged her to take turmeric.  Breast cancer surveillance: Breast exam 03/28/2022: Benign Mammogram 03/07/2022: Benign breast density category C  Return to clinic in 1 year for follow-up    Orders Placed This Encounter  Procedures   MR BREAST BILATERAL [facility] [place] CAD    Standing Status:   Future    Standing Expiration Date:   03/29/2023    Order  Specific Question:   If indicated for the ordered procedure, I authorize the administration of contrast media per Radiology protocol    Answer:   Yes    Order Specific Question:   What is the patient's sedation requirement?    Answer:   No Sedation    Order Specific Question:   Does the patient have a pacemaker or implanted devices?    Answer:   No    Order Specific Question:   Preferred imaging location?    Answer:   [facility] (table limit-550lbs)   The patient has a good understanding of the overall plan. she agrees with it. she will call with any problems that may develop before the next visit here. Total time spent: 30 mins including face to face time and time spent for planning, charting and co-ordination of care   Harriette Ohara, MD 03/28/22    I Gardiner Coins am scribing for Dr. Lindi Adie  I have reviewed the above documentation for accuracy and completeness, and I agree with the above.

## 2022-03-27 ENCOUNTER — Other Ambulatory Visit: Payer: Self-pay

## 2022-03-27 DIAGNOSIS — C50411 Malignant neoplasm of upper-outer quadrant of right female breast: Secondary | ICD-10-CM

## 2022-03-28 ENCOUNTER — Inpatient Hospital Stay: Payer: Medicare Other | Admitting: Hematology and Oncology

## 2022-03-28 ENCOUNTER — Inpatient Hospital Stay: Payer: Medicare Other | Attending: Hematology and Oncology

## 2022-03-28 VITALS — BP 129/79 | HR 65 | Temp 97.8°F | Resp 18 | Ht 61.5 in | Wt 155.0 lb

## 2022-03-28 DIAGNOSIS — Z79811 Long term (current) use of aromatase inhibitors: Secondary | ICD-10-CM | POA: Insufficient documentation

## 2022-03-28 DIAGNOSIS — Z79899 Other long term (current) drug therapy: Secondary | ICD-10-CM | POA: Insufficient documentation

## 2022-03-28 DIAGNOSIS — Z170 Estrogen receptor positive status [ER+]: Secondary | ICD-10-CM | POA: Insufficient documentation

## 2022-03-28 DIAGNOSIS — C50411 Malignant neoplasm of upper-outer quadrant of right female breast: Secondary | ICD-10-CM | POA: Diagnosis present

## 2022-03-28 LAB — CBC WITH DIFFERENTIAL (CANCER CENTER ONLY)
Abs Immature Granulocytes: 0.02 10*3/uL (ref 0.00–0.07)
Basophils Absolute: 0.1 10*3/uL (ref 0.0–0.1)
Basophils Relative: 1 %
Eosinophils Absolute: 0.5 10*3/uL (ref 0.0–0.5)
Eosinophils Relative: 6 %
HCT: 37.4 % (ref 36.0–46.0)
Hemoglobin: 12.1 g/dL (ref 12.0–15.0)
Immature Granulocytes: 0 %
Lymphocytes Relative: 23 %
Lymphs Abs: 1.7 10*3/uL (ref 0.7–4.0)
MCH: 28.4 pg (ref 26.0–34.0)
MCHC: 32.4 g/dL (ref 30.0–36.0)
MCV: 87.8 fL (ref 80.0–100.0)
Monocytes Absolute: 0.5 10*3/uL (ref 0.1–1.0)
Monocytes Relative: 7 %
Neutro Abs: 4.8 10*3/uL (ref 1.7–7.7)
Neutrophils Relative %: 63 %
Platelet Count: 213 10*3/uL (ref 150–400)
RBC: 4.26 MIL/uL (ref 3.87–5.11)
RDW: 13.2 % (ref 11.5–15.5)
WBC Count: 7.6 10*3/uL (ref 4.0–10.5)
nRBC: 0 % (ref 0.0–0.2)

## 2022-03-28 LAB — CMP (CANCER CENTER ONLY)
ALT: 13 U/L (ref 0–44)
AST: 16 U/L (ref 15–41)
Albumin: 4.2 g/dL (ref 3.5–5.0)
Alkaline Phosphatase: 66 U/L (ref 38–126)
Anion gap: 6 (ref 5–15)
BUN: 14 mg/dL (ref 8–23)
CO2: 29 mmol/L (ref 22–32)
Calcium: 9.9 mg/dL (ref 8.9–10.3)
Chloride: 105 mmol/L (ref 98–111)
Creatinine: 0.87 mg/dL (ref 0.44–1.00)
GFR, Estimated: >60 mL/min
Glucose, Bld: 93 mg/dL (ref 70–99)
Potassium: 3.8 mmol/L (ref 3.5–5.1)
Sodium: 140 mmol/L (ref 135–145)
Total Bilirubin: 0.4 mg/dL (ref 0.3–1.2)
Total Protein: 7.1 g/dL (ref 6.5–8.1)

## 2022-03-28 MED ORDER — ANASTROZOLE 1 MG PO TABS: 1.0000 mg | ORAL_TABLET | Freq: Every day | ORAL | 3 refills | Status: DC

## 2022-03-28 NOTE — Assessment & Plan Note (Addendum)
Dr. Jana Hakim patient establishing oncology care with me 03/09/2020: Right breast UOQ: Stage Ia ILC ER/PR positive HER2 negative Ki-67 10% 06/09/2020: Right mastectomy: T3N0 I plus stage IIa grade 2 ILC with negative margins 1 lymph node with isolated tumor cells out of 7 Oncotype DX: Score 10: Risk of distant recurrence 3% 09/01/2020: Completed adjuvant radiation 05/16/2020: Genetics: Negative  Current treatment: Anastrozole started November 2021 Anastrozole toxicities: Weight gain: We discussed at length about measures to decrease her weight Feeling hot Joint stiffness: Encouraged her to take turmeric.  Breast cancer surveillance: Breast exam 03/28/2022: Benign Mammogram 03/07/2022: Benign breast density category C  Return to clinic in 1 year for follow-up

## 2022-03-29 ENCOUNTER — Telehealth: Payer: Self-pay | Admitting: Hematology and Oncology

## 2022-03-29 NOTE — Telephone Encounter (Signed)
Scheduled appointment per 11/7 los. Patient is aware.

## 2022-05-03 ENCOUNTER — Ambulatory Visit: Payer: Medicare Other | Attending: General Surgery

## 2022-05-03 VITALS — Wt 158.0 lb

## 2022-05-03 DIAGNOSIS — Z483 Aftercare following surgery for neoplasm: Secondary | ICD-10-CM | POA: Insufficient documentation

## 2022-05-03 NOTE — Therapy (Signed)
OUTPATIENT PHYSICAL THERAPY SOZO SCREENING NOTE   Patient Name: Terri Gay MRN: [id number] DOB:21-Nov-1950, 71 y.o., female Today's Date: 05/03/2022  PCP: Dorothyann Peng, NP REFERRING PROVIDER: Jovita Kussmaul, MD   PT End of Session - 05/03/22 1006     Visit Number 2   # unchanged due to screen only   PT Start Time 1003    PT Stop Time 1009    PT Time Calculation (min) 6 min    Activity Tolerance Patient tolerated treatment well    Behavior During Therapy [facility] for tasks assessed/performed             Past Medical History:  Diagnosis Date   Allergy    seasonal   Anxiety    Arthritis    Breast cancer ([place])    Cancer ([place]) 2021   right breast IDC with DCIS   Depression    Diverticulosis 10/07/2010   Colonoscopy   GERD (gastroesophageal reflux disease)    OTC   Hemorrhoids    History of chicken pox    History of radiation therapy 07/21/2020-09/01/2020   Right breast- Dr. Gery Pray   Hyperlipidemia    Hypertension    Knee pain, left 10/24/2013   Osteopenia 04/25/2013   Osteoporosis    Palpitations 04/26/2013   Preventative health care 04/26/2013   Past Surgical History:  Procedure Laterality Date   MASTECTOMY     MASTECTOMY W/ SENTINEL NODE BIOPSY Right 06/09/2020   Procedure: RIGHT MASTECTOMY WITH RIGHT AXILLARY SENTINEL LYMPH NODE BIOPSY;  Surgeon: Jovita Kussmaul, MD;  Location: [facility];  Service: General;  Laterality: Right;   NO PAST SURGERIES     Patient Active Problem List   Diagnosis Date Noted   Cancer of right female breast ([place]) 06/09/2020   Genetic testing 05/17/2020   Malignant neoplasm of upper-outer quadrant of right breast in female, estrogen receptor positive ([place]) 03/11/2020   Knee pain, left 10/24/2013   Palpitations 04/26/2013   Preventative health care 04/26/2013   Osteopenia 04/25/2013   Acute esophagitis 09/19/2012   GERD (gastroesophageal reflux disease) 03/03/2012   Essential hypertension 08/23/2011   Rectal  bleeding 06/23/2011   Hyperlipidemia 03/02/2011   Depression with anxiety 01/26/2011   Insomnia 01/26/2011   Diverticulosis 01/17/2011   Hemorrhoids 01/17/2011   Osteoarthritis 01/17/2011    REFERRING DIAG: right breast cancer at risk for lymphedema  THERAPY DIAG: Aftercare following surgery for neoplasm  PERTINENT HISTORY: Patient was diagnosed on 03/01/2020 with right grade II invasive lobular carcinoma breast cancer. She underwent a right mastectomy and sentinel node biopsy (1/6 positive axillary lymph nodes) on 06/09/2020. It is ER/PR positive and HER2 negative with a Ki67 of 10%.   PRECAUTIONS: right UE Lymphedema risk, None  SUBJECTIVE: Pt returns for her 3 month L-Dex screen. "I'm taking an international 12 hr flight in November. Can you help me get a compression sleeve? "   PAIN:  Are you having pain? No  SOZO SCREENING: Patient was assessed today using the SOZO machine to determine the lymphedema index score. This was compared to her baseline score. It was determined that she is within the recommended range when compared to her baseline and no further action is needed at this time. She will continue SOZO screenings. These are done every 3 months for 2 years post operatively followed by every 6 months for 2 years, and then annually.    Terri Gay, PT had a cancellation so was able to measure pt for a  [employer] and gave her info on how to order online.    L-DEX FLOWSHEETS - 05/03/22 1000       L-DEX LYMPHEDEMA SCREENING   Measurement Type Unilateral    L-DEX MEASUREMENT EXTREMITY Upper Extremity    POSITION  Standing    DOMINANT SIDE Right    At Risk Side Right    BASELINE SCORE (UNILATERAL) -6.1    L-DEX SCORE (UNILATERAL) -5.4    VALUE CHANGE (UNILAT) 0.7               Terri Gay, PTA 05/03/2022, 10:10 AM

## 2022-05-19 ENCOUNTER — Other Ambulatory Visit: Payer: Self-pay | Admitting: Adult Health

## 2022-06-19 ENCOUNTER — Telehealth: Payer: Self-pay | Admitting: Adult Health

## 2022-06-19 NOTE — Telephone Encounter (Signed)
Spoke with patient to schedule AWV.  Patient declined stating she was not going to schedule.  She stated she did need this.

## 2022-07-26 DIAGNOSIS — Z170 Estrogen receptor positive status [ER+]: Secondary | ICD-10-CM | POA: Diagnosis not present

## 2022-07-26 DIAGNOSIS — C50411 Malignant neoplasm of upper-outer quadrant of right female breast: Secondary | ICD-10-CM | POA: Diagnosis not present

## 2022-08-17 ENCOUNTER — Other Ambulatory Visit: Payer: Self-pay | Admitting: Adult Health

## 2022-09-05 ENCOUNTER — Telehealth: Payer: Self-pay | Admitting: Hematology and Oncology

## 2022-09-05 ENCOUNTER — Ambulatory Visit
Admission: RE | Admit: 2022-09-05 | Discharge: 2022-09-05 | Disposition: A | Discharge status: Home / Self Care | Payer: Medicare Other | Source: Ambulatory Visit | Attending: Hematology and Oncology | Admitting: Hematology and Oncology

## 2022-09-05 DIAGNOSIS — Z170 Estrogen receptor positive status [ER+]: Secondary | ICD-10-CM | POA: Diagnosis not present

## 2022-09-05 DIAGNOSIS — C50411 Malignant neoplasm of upper-outer quadrant of right female breast: Secondary | ICD-10-CM | POA: Diagnosis not present

## 2022-09-05 MED ORDER — GADOPICLENOL 0.5 MMOL/ML IV SOLN
7.0000 mL | Freq: Once | INTRAVENOUS | Status: AC | PRN
  Administered 2022-09-05: 7 mL via INTRAVENOUS

## 2022-09-05 NOTE — Telephone Encounter (Signed)
I informed the patient of the breast MRI was normal 

## 2022-10-05 ENCOUNTER — Ambulatory Visit (INDEPENDENT_AMBULATORY_CARE_PROVIDER_SITE_OTHER): Payer: Medicare Other | Admitting: Adult Health

## 2022-10-05 ENCOUNTER — Encounter: Payer: Self-pay | Admitting: Adult Health

## 2022-10-05 VITALS — BP 120/82 | HR 70 | Temp 98.1°F | Ht 62.0 in | Wt 157.0 lb

## 2022-10-05 DIAGNOSIS — Z0000 Encounter for general adult medical examination without abnormal findings: Secondary | ICD-10-CM

## 2022-10-05 DIAGNOSIS — Z170 Estrogen receptor positive status [ER+]: Secondary | ICD-10-CM

## 2022-10-05 DIAGNOSIS — C50911 Malignant neoplasm of unspecified site of right female breast: Secondary | ICD-10-CM | POA: Diagnosis not present

## 2022-10-05 DIAGNOSIS — G4701 Insomnia due to medical condition: Secondary | ICD-10-CM | POA: Diagnosis not present

## 2022-10-05 DIAGNOSIS — Z1211 Encounter for screening for malignant neoplasm of colon: Secondary | ICD-10-CM | POA: Diagnosis not present

## 2022-10-05 DIAGNOSIS — I10 Essential (primary) hypertension: Secondary | ICD-10-CM | POA: Diagnosis not present

## 2022-10-05 DIAGNOSIS — E785 Hyperlipidemia, unspecified: Secondary | ICD-10-CM

## 2022-10-05 DIAGNOSIS — F418 Other specified anxiety disorders: Secondary | ICD-10-CM

## 2022-10-05 LAB — COMPREHENSIVE METABOLIC PANEL WITH GFR
ALT: 17 U/L (ref 0–35)
AST: 19 U/L (ref 0–37)
Albumin: 4.3 g/dL (ref 3.5–5.2)
Alkaline Phosphatase: 76 U/L (ref 39–117)
BUN: 13 mg/dL (ref 6–23)
CO2: 30 meq/L (ref 19–32)
Calcium: 10.1 mg/dL (ref 8.4–10.5)
Chloride: 102 meq/L (ref 96–112)
Creatinine, Ser: 0.77 mg/dL (ref 0.40–1.20)
GFR: 78.24 mL/min
Glucose, Bld: 85 mg/dL (ref 70–99)
Potassium: 3.9 meq/L (ref 3.5–5.1)
Sodium: 141 meq/L (ref 135–145)
Total Bilirubin: 0.4 mg/dL (ref 0.2–1.2)
Total Protein: 7.5 g/dL (ref 6.0–8.3)

## 2022-10-05 LAB — CBC WITH DIFFERENTIAL/PLATELET
Basophils Absolute: 0.1 10*3/uL (ref 0.0–0.1)
Basophils Relative: 1 % (ref 0.0–3.0)
Eosinophils Absolute: 0.5 10*3/uL (ref 0.0–0.7)
Eosinophils Relative: 6.2 % — ABNORMAL HIGH (ref 0.0–5.0)
HCT: 37.5 % (ref 36.0–46.0)
Hemoglobin: 12.4 g/dL (ref 12.0–15.0)
Lymphocytes Relative: 19.9 % (ref 12.0–46.0)
Lymphs Abs: 1.5 10*3/uL (ref 0.7–4.0)
MCHC: 33.1 g/dL (ref 30.0–36.0)
MCV: 86.1 fl (ref 78.0–100.0)
Monocytes Absolute: 0.4 10*3/uL (ref 0.1–1.0)
Monocytes Relative: 6.1 % (ref 3.0–12.0)
Neutro Abs: 4.9 10*3/uL (ref 1.4–7.7)
Neutrophils Relative %: 66.8 % (ref 43.0–77.0)
Platelets: 217 10*3/uL (ref 150.0–400.0)
RBC: 4.35 Mil/uL (ref 3.87–5.11)
RDW: 14.6 % (ref 11.5–15.5)
WBC: 7.3 10*3/uL (ref 4.0–10.5)

## 2022-10-05 LAB — LIPID PANEL
Cholesterol: 174 mg/dL (ref 0–200)
HDL: 50.6 mg/dL
LDL Cholesterol: 99 mg/dL (ref 0–99)
NonHDL: 123.5
Total CHOL/HDL Ratio: 3
Triglycerides: 121 mg/dL (ref 0.0–149.0)
VLDL: 24.2 mg/dL (ref 0.0–40.0)

## 2022-10-05 LAB — TSH: TSH: 1.42 u[IU]/mL (ref 0.35–5.50)

## 2022-10-05 MED ORDER — BISOPROLOL FUMARATE 10 MG PO TABS: 10.0000 mg | ORAL_TABLET | Freq: Every day | ORAL | 3 refills | Status: DC

## 2022-10-05 MED ORDER — HYDROCHLOROTHIAZIDE 12.5 MG PO CAPS: 12.5000 mg | ORAL_CAPSULE | Freq: Every day | ORAL | 3 refills | Status: DC

## 2022-10-05 NOTE — Patient Instructions (Addendum)
It was great seeing you today   We will follow up with you regarding your lab work   Please let me know if you need anything   Please call [facility] to schedule your colonoscopy  Phone: [phone]

## 2022-10-05 NOTE — Progress Notes (Signed)
Subjective:    Patient ID: Terri Gay, female    DOB: 14-Aug-1950, 72 y.o.   MRN: [id number]  HPI Patient presents for yearly preventative medicine examination. She is a pleasant 72 year old female who  has a past medical history of Allergy, Anxiety, Arthritis, Breast cancer (HCC), Cancer (HCC) (2021), Depression, Diverticulosis (10/07/2010), GERD (gastroesophageal reflux disease), Hemorrhoids, History of chicken pox, History of radiation therapy (07/21/2020-09/01/2020), Hyperlipidemia, Hypertension, Knee pain, left (10/24/2013), Osteopenia (04/25/2013), Osteoporosis, Palpitations (04/26/2013), and Preventative health care (04/26/2013).  Hypertension-controlled well with Zebeta 10 mg daily and a HCTZ 12.5 mg daily.  He denies dizziness, lightheadedness, chest pain, headaches, shortness of breath, or syncopal episodes BP Readings from Last 3 Encounters:  10/05/22 120/82  03/28/22 129/79  09/29/21 112/82   Anxiety/depression/insomnia-takes Klonopin 0.5 mg, 1 tablet in the morning and 2 tablets at bedtime as needed.  She is also prescribed Lexapro 10 mg daily by oncology  Hyperlipidemia-takes Lipitor -10 mg every day.  She denies myalgia or fatigue. She took a break from that lipitor for about 6 months due to joint pain. Restarted about 2 weeks ago.  Lab Results  Component Value Date   CHOL 155 09/29/2021   HDL 52.50 09/29/2021   LDLCALC 86 09/29/2021   TRIG 83.0 09/29/2021   CHOLHDL 3 09/29/2021    H/o breast cancer -diagnosed in October 2021.  She underwent a right mastectomy with right axillary sentinel lymph node biopsy in January 2022.  Completed 30 rounds of radiation therapy.  Currently taking Arimidex 1 mg daily.  All immunizations and health maintenance protocols were reviewed with the patient and needed orders were placed. She refused vaccinations   Appropriate screening laboratory values were ordered for the patient including screening of hyperlipidemia, renal function and hepatic  function.  Medication reconciliation,  past medical history, social history, problem list and allergies were reviewed in detail with the patient  Goals were established with regard to weight loss, exercise, and  diet in compliance with medications. She does eat healthy but has not been exercising much due to left knee pain that has become chronic. She was seen by orthopedics and will need a partial knee replacement at some point in time   Wt Readings from Last 3 Encounters:  10/05/22 157 lb (71.2 kg)  05/03/22 158 lb (71.7 kg)  03/28/22 155 lb (70.3 kg)   She is overdue for routine colon cancer screening. Is up to date on mammograms and GYN care  Review of Systems  Constitutional: Negative.   HENT: Negative.    Eyes: Negative.   Respiratory: Negative.    Cardiovascular: Negative.   Gastrointestinal: Negative.   Endocrine: Negative.   Genitourinary: Negative.   Musculoskeletal: Negative.   Skin: Negative.   Allergic/Immunologic: Negative.   Neurological: Negative.   Hematological: Negative.   Psychiatric/Behavioral: Negative.     Past Medical History:  Diagnosis Date   Allergy    seasonal   Anxiety    Arthritis    Breast cancer (HCC)    Cancer (HCC) 2021   right breast IDC with DCIS   Depression    Diverticulosis 10/07/2010   Colonoscopy   GERD (gastroesophageal reflux disease)    OTC   Hemorrhoids    History of chicken pox    History of radiation therapy 07/21/2020-09/01/2020   Right breast- Dr. Antony Blackbird   Hyperlipidemia    Hypertension    Knee pain, left 10/24/2013   Osteopenia 04/25/2013   Osteoporosis    Palpitations  04/26/2013   Preventative health care 04/26/2013    Social History   Socioeconomic History   Marital status: Married    Spouse name: Not on file   Number of children: 2   Years of education: Not on file   Highest education level: Not on file  Occupational History   Occupation: Customer Service    Employer: [employer]     Comment: retired  Tobacco Use   Smoking status: Never   Smokeless tobacco: Never  Substance and Sexual Activity   Alcohol use: Yes    Alcohol/week: 1.0 standard drink of alcohol    Types: 1 Glasses of wine per week    Comment: occasionally   Drug use: No   Sexual activity: Yes    Birth control/protection: Post-menopausal  Other Topics Concern   Not on file  Social History Narrative   Retired from [employer]    Married for 27 years    Have one son and one daughter ( live in [place])       She likes to dance, read, and play with her dogs.          Caffeine Use: 1 cup coffee and 1 cup tea      Social Determinants of Health   Financial Resource Strain: Low Risk  (06/06/2021)   Overall Financial Resource Strain (CARDIA)    Difficulty of Paying Living Expenses: Not hard at all  Food Insecurity: No Food Insecurity (06/06/2021)   Hunger Vital Sign    Worried About Running Out of Food in the Last Year: Never true    Ran Out of Food in the Last Year: Never true  Transportation Needs: No Transportation Needs (06/06/2021)   PRAPARE - Administrator, Civil Service (Medical): No    Lack of Transportation (Non-Medical): No  Physical Activity: Inactive (06/06/2021)   Exercise Vital Sign    Days of Exercise per Week: 0 days    Minutes of Exercise per Session: 0 min  Stress: No Stress Concern Present (06/06/2021)   [employer] of Occupational Health - Occupational Stress Questionnaire    Feeling of Stress : Not at all  Social Connections: Socially Integrated (06/06/2021)   Social Connection and Isolation Panel [NHANES]    Frequency of Communication with Friends and Family: More than three times a week    Frequency of Social Gatherings with Friends and Family: More than three times a week    Attends Religious Services: More than 4 times per year    Active Member of [employer] or Organizations: Yes    Attends Banker Meetings: More than 4 times per year     Marital Status: Married  Catering manager Violence: Not At Risk (06/06/2021)   Humiliation, Afraid, Rape, and Kick questionnaire    Fear of Current or Ex-Partner: No    Emotionally Abused: No    Physically Abused: No    Sexually Abused: No    Past Surgical History:  Procedure Laterality Date   MASTECTOMY     MASTECTOMY W/ SENTINEL NODE BIOPSY Right 06/09/2020   Procedure: RIGHT MASTECTOMY WITH RIGHT AXILLARY SENTINEL LYMPH NODE BIOPSY;  Surgeon: Griselda Miner, MD;  Location: [facility];  Service: General;  Laterality: Right;   NO PAST SURGERIES      Family History  Problem Relation Age of Onset   Lung cancer Father        d. 11   Lung cancer Paternal Grandfather  smoker   Diabetes Mother    Arthritis Mother    Hyperlipidemia Mother    Hypertension Mother    Heart disease Mother        CHF   Diabetes Maternal Grandmother    Heart disease Maternal Grandmother    Arthritis Maternal Grandmother    Hyperlipidemia Maternal Grandmother    Hypertension Maternal Grandmother    Hyperlipidemia Sister    Arthritis Paternal Grandmother    Heart disease Daughter        arrythmia, Internal Cardiac Defibrillator   Lupus Son    Drug abuse Son    Colon cancer Neg Hx    Esophageal cancer Neg Hx    Rectal cancer Neg Hx    Stomach cancer Neg Hx     Allergies  Allergen Reactions   Amoxicillin Diarrhea    Current Outpatient Medications on File Prior to Visit  Medication Sig Dispense Refill   acetaminophen (TYLENOL) 500 MG tablet Take 500 mg by mouth every 6 (six) hours as needed.     anastrozole (ARIMIDEX) 1 MG tablet Take 1 tablet (1 mg total) by mouth daily. 90 tablet 3   atorvastatin (LIPITOR) 10 MG tablet Take 1 tablet by mouth once daily 90 tablet 0   Bioflavonoid Products (VITAMIN C PLUS) 1000 MG TABS Vitamin C     bisoprolol (ZEBETA) 10 MG tablet Take 1 tablet (10 mg total) by mouth daily. 90 tablet 3   Cetirizine HCl (ZYRTEC ALLERGY) 10 MG CAPS       Cholecalciferol 50 MCG (2000 UT) TABS Take by mouth.     clonazePAM (KLONOPIN) 1 MG tablet Take 1 mg by mouth every 8 (eight) hours as needed. for anxiety     Coenzyme Q10 (COQ10) 100 MG CAPS Take by mouth.     escitalopram (LEXAPRO) 10 MG tablet Take 10 mg by mouth daily.     Glucosamine Sulfate 750 MG TABS Take by mouth.     hydrochlorothiazide (MICROZIDE) 12.5 MG capsule Take 1 capsule (12.5 mg total) by mouth daily. 90 capsule 3   ibuprofen (ADVIL) 600 MG tablet Take 1 tablet by mouth every 6 (six) hours as needed.     magnesium gluconate (MAGONATE) 500 MG tablet Take 500 mg by mouth daily.     meloxicam (MOBIC) 15 MG tablet Take 15 mg by mouth daily.     Zinc 50 MG CAPS zinc     Current Facility-Administered Medications on File Prior to Visit  Medication Dose Route Frequency Provider Last Rate Last Admin   Sonafine emulsion 1 application  1 application  Topical Once Antony Blackbird, MD        BP 120/82   Pulse 70   Temp 98.1 F (36.7 C) (Oral)   Ht 5\' 2"  (1.575 m)   Wt 157 lb (71.2 kg)   SpO2 96%   BMI 28.72 kg/m       Objective:   Physical Exam Vitals and nursing note reviewed.  Constitutional:      General: She is not in acute distress.    Appearance: Normal appearance. She is not ill-appearing.  HENT:     Head: Normocephalic and atraumatic.     Right Ear: Tympanic membrane, ear canal and external ear normal. There is no impacted cerumen.     Left Ear: Tympanic membrane, ear canal and external ear normal. There is no impacted cerumen.     Nose: Nose normal. No congestion or rhinorrhea.     Mouth/Throat:  Mouth: Mucous membranes are moist.     Pharynx: Oropharynx is clear.  Eyes:     Extraocular Movements: Extraocular movements intact.     Conjunctiva/sclera: Conjunctivae normal.     Pupils: Pupils are equal, round, and reactive to light.  Neck:     Vascular: No carotid bruit.  Cardiovascular:     Rate and Rhythm: Normal rate and regular rhythm.     Pulses:  Normal pulses.     Heart sounds: No murmur heard.    No friction rub. No gallop.  Pulmonary:     Effort: Pulmonary effort is normal.     Breath sounds: Normal breath sounds.  Abdominal:     General: Abdomen is flat. Bowel sounds are normal. There is no distension.     Palpations: Abdomen is soft. There is no mass.     Tenderness: There is no abdominal tenderness. There is no guarding or rebound.     Hernia: No hernia is present.  Musculoskeletal:        General: Normal range of motion.     Cervical back: Normal range of motion and neck supple.  Lymphadenopathy:     Cervical: No cervical adenopathy.  Skin:    General: Skin is warm and dry.     Capillary Refill: Capillary refill takes less than 2 seconds.  Neurological:     General: No focal deficit present.     Mental Status: She is alert and oriented to person, place, and time.  Psychiatric:        Mood and Affect: Mood normal.        Behavior: Behavior normal.        Thought Content: Thought content normal.        Judgment: Judgment normal.        Assessment & Plan:  1. Routine general medical examination at a health care facility Today patient counseled on age appropriate routine health concerns for screening and prevention, each reviewed and up to date or declined. Immunizations reviewed and up to date or declined. Labs ordered and reviewed. Risk factors for depression reviewed and negative. Hearing function and visual acuity are intact. ADLs screened and addressed as needed. Functional ability and level of safety reviewed and appropriate. Education, counseling and referrals performed based on assessed risks today. Patient provided with a copy of personalized plan for preventive services. - Follow up in on year or sooner if needed  2. Hyperlipidemia, unspecified hyperlipidemia type - Continue with lipitor  - CBC with Differential/Platelet; Future - Comprehensive metabolic panel; Future - Lipid panel; Future - TSH;  Future  3. Essential hypertension - Well controlled. No change in medication  - CBC with Differential/Platelet; Future - Comprehensive metabolic panel; Future - Lipid panel; Future - TSH; Future - bisoprolol (ZEBETA) 10 MG tablet; Take 1 tablet (10 mg total) by mouth daily.  Dispense: 90 tablet; Refill: 3 - hydrochlorothiazide (MICROZIDE) 12.5 MG capsule; Take 1 capsule (12.5 mg total) by mouth daily.  Dispense: 90 capsule; Refill: 3  4. Depression with anxiety - Continue with lexapro and klonopin  - CBC with Differential/Platelet; Future - Comprehensive metabolic panel; Future - Lipid panel; Future - TSH; Future  5. Malignant neoplasm of right breast in female, estrogen receptor positive, unspecified site of breast (HCC) - Per oncology  - CBC with Differential/Platelet; Future - Comprehensive metabolic panel; Future - Lipid panel; Future - TSH; Future  6. Insomnia due to medical condition - Continue with klonopin PRN   7. Colon cancer  screening  - Ambulatory referral to Gastroenterology  Shirline Frees, NP

## 2022-10-06 ENCOUNTER — Telehealth: Payer: Self-pay | Admitting: Adult Health

## 2022-10-06 NOTE — Telephone Encounter (Signed)
Pt called, returning CMA's call regarding labs. CMA was with a patient. Pt asked that CMA call back at her earliest convenience.

## 2022-10-06 NOTE — Telephone Encounter (Signed)
Patient notified of update  and verbalized understanding. 

## 2022-11-06 ENCOUNTER — Ambulatory Visit: Payer: Medicare Other | Attending: General Surgery | Admitting: Physical Therapy

## 2022-11-06 DIAGNOSIS — Z483 Aftercare following surgery for neoplasm: Secondary | ICD-10-CM | POA: Insufficient documentation

## 2022-11-06 NOTE — Therapy (Signed)
OUTPATIENT PHYSICAL THERAPY SOZO SCREENING NOTE   Patient Name: Terri Gay MRN: [id number] DOB:[birth year], 72 y.o., female Today's Date: 11/06/2022  PCP: Shirline Frees, NP REFERRING PROVIDER: Griselda Miner, MD   PT End of Session - 11/06/22 1037     Visit Number 2    PT Start Time 1037    PT Stop Time 1044    PT Time Calculation (min) 7 min    Activity Tolerance Patient tolerated treatment well    Behavior During Therapy [facility] for tasks assessed/performed             Past Medical History:  Diagnosis Date   Allergy    seasonal   Anxiety    Arthritis    Breast cancer (HCC)    Cancer (HCC) 2021   right breast IDC with DCIS   Depression    Diverticulosis 10/07/2010   Colonoscopy   GERD (gastroesophageal reflux disease)    OTC   Hemorrhoids    History of chicken pox    History of radiation therapy 07/21/2020-09/01/2020   Right breast- Dr. Antony Blackbird   Hyperlipidemia    Hypertension    Knee pain, left 10/24/2013   Osteopenia 04/25/2013   Osteoporosis    Palpitations 04/26/2013   Preventative health care 04/26/2013   Past Surgical History:  Procedure Laterality Date   MASTECTOMY     MASTECTOMY W/ SENTINEL NODE BIOPSY Right 06/09/2020   Procedure: RIGHT MASTECTOMY WITH RIGHT AXILLARY SENTINEL LYMPH NODE BIOPSY;  Surgeon: Griselda Miner, MD;  Location: [facility];  Service: General;  Laterality: Right;   NO PAST SURGERIES     Patient Active Problem List   Diagnosis Date Noted   Cancer of right female breast (HCC) 06/09/2020   Genetic testing 05/17/2020   Malignant neoplasm of upper-outer quadrant of right breast in female, estrogen receptor positive (HCC) 03/11/2020   Knee pain, left 10/24/2013   Palpitations 04/26/2013   Preventative health care 04/26/2013   Osteopenia 04/25/2013   Acute esophagitis 09/19/2012   GERD (gastroesophageal reflux disease) 03/03/2012   Essential hypertension 08/23/2011   Rectal bleeding 06/23/2011    Hyperlipidemia 03/02/2011   Depression with anxiety 01/26/2011   Insomnia 01/26/2011   Diverticulosis 01/17/2011   Hemorrhoids 01/17/2011   Osteoarthritis 01/17/2011    REFERRING DIAG: right breast cancer at risk for lymphedema  THERAPY DIAG:  Aftercare following surgery for neoplasm  PERTINENT HISTORY: Patient was diagnosed on 03/01/2020 with right grade II invasive lobular carcinoma breast cancer. She underwent a right mastectomy and sentinel node biopsy (1/6 positive axillary lymph nodes) on 06/09/2020. It is ER/PR positive and HER2 negative with a Ki67 of 10%.    PRECAUTIONS: right UE Lymphedema risk  SUBJECTIVE: Here for SOZO screen  PAIN:  Are you having pain? No  SOZO SCREENING: Patient was assessed today using the SOZO machine to determine the lymphedema index score. This was compared to her baseline score. It was determined that she is within the recommended range when compared to her baseline and no further action is needed at this time. She will continue SOZO screenings. These are done every 3 months for 2 years post operatively followed by every 6 months for 2 years, and then annually.   L-DEX FLOWSHEETS - 11/06/22 1000       L-DEX LYMPHEDEMA SCREENING   Measurement Type Unilateral    L-DEX MEASUREMENT EXTREMITY Upper Extremity    POSITION  Standing    DOMINANT SIDE Right    At Risk  Side Right    BASELINE SCORE (UNILATERAL) -6.1    L-DEX SCORE (UNILATERAL) -3.8    VALUE CHANGE (UNILAT) 2.3             Bethann Punches, [place] 11/06/22 10:41 AM

## 2022-11-16 ENCOUNTER — Other Ambulatory Visit: Payer: Self-pay | Admitting: Adult Health

## 2022-12-11 ENCOUNTER — Encounter: Payer: Self-pay | Admitting: Internal Medicine

## 2023-01-11 DIAGNOSIS — H5213 Myopia, bilateral: Secondary | ICD-10-CM | POA: Diagnosis not present

## 2023-01-15 ENCOUNTER — Ambulatory Visit (AMBULATORY_SURGERY_CENTER): Payer: Medicare Other

## 2023-01-15 VITALS — Ht 62.0 in | Wt 154.0 lb

## 2023-01-15 DIAGNOSIS — Z1211 Encounter for screening for malignant neoplasm of colon: Secondary | ICD-10-CM

## 2023-01-15 MED ORDER — NA SULFATE-K SULFATE-MG SULF 17.5-3.13-1.6 GM/177ML PO SOLN: 1.0000 | Freq: Once | ORAL | 0 refills | Status: AC

## 2023-01-15 NOTE — Progress Notes (Signed)
No egg or soy allergy known to patient  No issues known to pt with past sedation with any surgeries or procedures Patient denies ever being told they had issues or difficulty with intubation  No FH of Malignant Hyperthermia Pt is not on diet pills Pt is not on  home 02  Pt is not on blood thinners  Pt denies issues with constipation  No A fib or A flutter Have any cardiac testing pending--no  LOA: independent  Prep: suprep   Patient's chart reviewed by Cathlyn Parsons CNRA prior to previsit and patient appropriate for the [facility].  Previsit completed and red dot placed by patient's name on their procedure day (on provider's schedule).     PV competed with patient. Prep instructions sent via mychart and home address. Goodrx coupon for [facility] provided to use for price reduction if needed.

## 2023-01-25 ENCOUNTER — Other Ambulatory Visit: Payer: Self-pay | Admitting: Obstetrics and Gynecology

## 2023-01-25 DIAGNOSIS — Z1231 Encounter for screening mammogram for malignant neoplasm of breast: Secondary | ICD-10-CM

## 2023-02-01 ENCOUNTER — Encounter: Payer: Self-pay | Admitting: Internal Medicine

## 2023-02-15 ENCOUNTER — Ambulatory Visit (AMBULATORY_SURGERY_CENTER): Payer: Medicare Other | Admitting: Internal Medicine

## 2023-02-15 ENCOUNTER — Encounter: Payer: Self-pay | Admitting: Internal Medicine

## 2023-02-15 VITALS — BP 120/84 | HR 75 | Temp 98.3°F | Resp 14 | Ht 62.0 in | Wt 154.0 lb

## 2023-02-15 DIAGNOSIS — Z1211 Encounter for screening for malignant neoplasm of colon: Secondary | ICD-10-CM

## 2023-02-15 MED ORDER — SODIUM CHLORIDE 0.9 % IV SOLN: 500.0000 mL | Freq: Once | INTRAVENOUS | Status: DC

## 2023-02-15 NOTE — Progress Notes (Signed)
HISTORY OF PRESENT ILLNESS:  Terri Gay is a 72 y.o. female sent for routine screening colonoscopy.  Previous exam 2012 with Dr. Juanda Chance was negative for neoplasia  REVIEW OF SYSTEMS:  All non-GI ROS negative except for  Past Medical History:  Diagnosis Date   Allergy    seasonal   Anxiety    Arthritis    Breast cancer (HCC)    Cancer (HCC) 2021   right breast IDC with DCIS   Depression    Diverticulosis 10/07/2010   Colonoscopy   GERD (gastroesophageal reflux disease)    OTC   Hemorrhoids    History of chicken pox    History of radiation therapy 07/21/2020-09/01/2020   Right breast- Dr. Antony Blackbird   Hyperlipidemia    Hypertension    Knee pain, left 10/24/2013   Osteopenia 04/25/2013   Osteoporosis    Palpitations 04/26/2013   Preventative health care 04/26/2013    Past Surgical History:  Procedure Laterality Date   MASTECTOMY     MASTECTOMY W/ SENTINEL NODE BIOPSY Right 06/09/2020   Procedure: RIGHT MASTECTOMY WITH RIGHT AXILLARY SENTINEL LYMPH NODE BIOPSY;  Surgeon: Griselda Miner, MD;  Location: [facility];  Service: General;  Laterality: Right;   NO PAST SURGERIES      Social History TEJUANA BITTER  reports that she has never smoked. She has never used smokeless tobacco. She reports current alcohol use of about 1.0 standard drink of alcohol per week. She reports that she does not use drugs.  family history includes Arthritis in her maternal grandmother, mother, and paternal grandmother; Diabetes in her maternal grandmother and mother; Drug abuse in her son; Heart disease in her daughter, maternal grandmother, and mother; Hyperlipidemia in her maternal grandmother, mother, and sister; Hypertension in her maternal grandmother and mother; Lung cancer in her father and paternal grandfather; Lupus in her son.  Allergies  Allergen Reactions   Amoxicillin Diarrhea       PHYSICAL EXAMINATION: Vital signs: BP (!) 151/95   Pulse 84   Temp 98.3 F (36.8  C)   Ht 5\' 2"  (1.575 m)   Wt 154 lb (69.9 kg)   SpO2 96%   BMI 28.17 kg/m  General: Well-developed, well-nourished, no acute distress HEENT: Sclerae are anicteric, conjunctiva pink. Oral mucosa intact Lungs: Clear Heart: Regular Abdomen: soft, nontender, nondistended, no obvious ascites, no peritoneal signs, normal bowel sounds. No organomegaly. Extremities: No edema Psychiatric: alert and oriented x3. Cooperative     ASSESSMENT:  Colon cancer screening   PLAN:   Screening colonoscopy

## 2023-02-15 NOTE — Progress Notes (Signed)
Report to PACU, RN, vss, BBS= Clear.  

## 2023-02-15 NOTE — Patient Instructions (Addendum)
Resume previous diet.  Continue present medications.     YOU HAD AN ENDOSCOPIC PROCEDURE TODAY AT THE [facility]:   Refer to the procedure report that was given to you for any specific questions about what was found during the examination.  If the procedure report does not answer your questions, please call your gastroenterologist to clarify.  If you requested that your care partner not be given the details of your procedure findings, then the procedure report has been included in a sealed envelope for you to review at your convenience later.  YOU SHOULD EXPECT: Some feelings of bloating in the abdomen. Passage of more gas than usual.  Walking can help get rid of the air that was put into your GI tract during the procedure and reduce the bloating. If you had a lower endoscopy (such as a colonoscopy or flexible sigmoidoscopy) you may notice spotting of blood in your stool or on the toilet paper. If you underwent a bowel prep for your procedure, you may not have a normal bowel movement for a few days.  Please Note:  You might notice some irritation and congestion in your nose or some drainage.  This is from the oxygen used during your procedure.  There is no need for concern and it should clear up in a day or so.  SYMPTOMS TO REPORT IMMEDIATELY:  Following lower endoscopy (colonoscopy or flexible sigmoidoscopy):  Excessive amounts of blood in the stool  Significant tenderness or worsening of abdominal pains  Swelling of the abdomen that is new, acute  Fever of 100F or higher   For urgent or emergent issues, a gastroenterologist can be reached at any hour by calling [phone]. Do not use MyChart messaging for urgent concerns.    DIET:  We do recommend a small meal at first, but then you may proceed to your regular diet.  Drink plenty of fluids but you should avoid alcoholic beverages for 24 hours.  ACTIVITY:  You should plan to take it easy for the rest of today and you  should NOT DRIVE or use heavy machinery until tomorrow (because of the sedation medicines used during the test).    FOLLOW UP: Our staff will call the number listed on your records the next business day following your procedure.  We will call around 7:15- 8:00 am to check on you and address any questions or concerns that you may have regarding the information given to you following your procedure. If we do not reach you, we will leave a message.     If any biopsies were taken you will be contacted by phone or by letter within the next 1-3 weeks.  Please call us at [phone] if you have not heard about the biopsies in 3 weeks.    SIGNATURES/CONFIDENTIALITY: You and/or your care partner have signed paperwork which will be entered into your electronic medical record.  These signatures attest to the fact that that the information above on your After Visit Summary has been reviewed and is understood.  Full responsibility of the confidentiality of this discharge information lies with you and/or your care-partner.

## 2023-02-15 NOTE — Progress Notes (Signed)
Pt's states no medical or surgical changes since previsit or office visit. 

## 2023-02-15 NOTE — Op Note (Signed)
[facility] Patient Name: Terri Gay Procedure Date: 02/15/2023 11:20 AM MRN: [id number] Endoscopist: Wilhemina Bonito. Marina Goodell , MD, [id number] Age: 72 Referring MD:  Date of Birth: 1950-12-27 Gender: Female Account #: [id number] Procedure:                Colonoscopy Indications:              Screening for colorectal malignant neoplasm Medicines:                Monitored Anesthesia Care Procedure:                Pre-Anesthesia Assessment:                           - Prior to the procedure, a History and Physical                            was performed, and patient medications and                            allergies were reviewed. The patient's tolerance of                            previous anesthesia was also reviewed. The risks                            and benefits of the procedure and the sedation                            options and risks were discussed with the patient.                            All questions were answered, and informed consent                            was obtained. Prior Anticoagulants: The patient has                            taken no anticoagulant or antiplatelet agents. ASA                            Grade Assessment: II - A patient with mild systemic                            disease. After reviewing the risks and benefits,                            the patient was deemed in satisfactory condition to                            undergo the procedure.                           After obtaining informed consent, the colonoscope  was passed under direct vision. Throughout the                            procedure, the patient's blood pressure, pulse, and                            oxygen saturations were monitored continuously. The                            Olympus Scope [id number] was introduced through the                            anus and advanced to the the cecum, identified by                            appendiceal  orifice and ileocecal valve. The                            ileocecal valve, appendiceal orifice, and rectum                            were photographed. The quality of the bowel                            preparation was excellent. The colonoscopy was                            performed without difficulty. The patient tolerated                            the procedure well. The bowel preparation used was                            SUPREP via split dose instruction. Scope In: 11:33:46 AM Scope Out: 11:43:25 AM Scope Withdrawal Time: 0 hours 6 minutes 50 seconds  Total Procedure Duration: 0 hours 9 minutes 39 seconds  Findings:                 Multiple diverticula were found in the sigmoid                            colon.                           Internal hemorrhoids were found during retroflexion.                           The exam was otherwise without abnormality on                            direct and retroflexion views. Complications:            No immediate complications. Estimated blood loss:                            None.  Estimated Blood Loss:     Estimated blood loss: none. Impression:               - Diverticulosis in the sigmoid colon.                           - Internal hemorrhoids.                           - The examination was otherwise normal on direct                            and retroflexion views.                           - No specimens collected. Recommendation:           - Repeat colonoscopy is not recommended for                            screening purposes.                           - Patient has a contact number available for                            emergencies. The signs and symptoms of potential                            delayed complications were discussed with the                            patient. Return to normal activities tomorrow.                            Written discharge instructions were provided to the                             patient.                           - Resume previous diet.                           - Continue present medications. Wilhemina Bonito. Marina Goodell, MD 02/15/2023 11:46:45 AM This report has been signed electronically.

## 2023-02-16 ENCOUNTER — Telehealth: Payer: Self-pay | Admitting: *Deleted

## 2023-02-16 NOTE — Telephone Encounter (Signed)
Post procedure follow up phone call. No answer at number given.  No option to leave message on voicemail.

## 2023-03-09 ENCOUNTER — Ambulatory Visit
Admission: RE | Admit: 2023-03-09 | Discharge: 2023-03-09 | Disposition: A | Discharge status: Home / Self Care | Payer: Medicare Other | Source: Ambulatory Visit | Attending: Obstetrics and Gynecology | Admitting: Obstetrics and Gynecology

## 2023-03-09 DIAGNOSIS — Z1231 Encounter for screening mammogram for malignant neoplasm of breast: Secondary | ICD-10-CM | POA: Diagnosis not present

## 2023-03-28 ENCOUNTER — Other Ambulatory Visit: Payer: Self-pay | Admitting: *Deleted

## 2023-03-28 DIAGNOSIS — Z170 Estrogen receptor positive status [ER+]: Secondary | ICD-10-CM

## 2023-03-28 DIAGNOSIS — C50411 Malignant neoplasm of upper-outer quadrant of right female breast: Secondary | ICD-10-CM

## 2023-03-29 ENCOUNTER — Inpatient Hospital Stay: Payer: Medicare Other

## 2023-03-29 ENCOUNTER — Inpatient Hospital Stay: Payer: Medicare Other | Attending: Hematology and Oncology | Admitting: Hematology and Oncology

## 2023-03-29 VITALS — BP 130/82 | HR 76 | Temp 97.7°F | Resp 18 | Ht 62.0 in | Wt 157.7 lb

## 2023-03-29 DIAGNOSIS — Z79811 Long term (current) use of aromatase inhibitors: Secondary | ICD-10-CM | POA: Diagnosis not present

## 2023-03-29 DIAGNOSIS — Z9011 Acquired absence of right breast and nipple: Secondary | ICD-10-CM | POA: Insufficient documentation

## 2023-03-29 DIAGNOSIS — C50411 Malignant neoplasm of upper-outer quadrant of right female breast: Secondary | ICD-10-CM | POA: Diagnosis present

## 2023-03-29 DIAGNOSIS — Z79899 Other long term (current) drug therapy: Secondary | ICD-10-CM | POA: Diagnosis not present

## 2023-03-29 DIAGNOSIS — Z170 Estrogen receptor positive status [ER+]: Secondary | ICD-10-CM | POA: Insufficient documentation

## 2023-03-29 DIAGNOSIS — Z923 Personal history of irradiation: Secondary | ICD-10-CM | POA: Insufficient documentation

## 2023-03-29 LAB — CMP (CANCER CENTER ONLY)
ALT: 27 U/L (ref 0–44)
AST: 24 U/L (ref 15–41)
Albumin: 4.4 g/dL (ref 3.5–5.0)
Alkaline Phosphatase: 73 U/L (ref 38–126)
Anion gap: 6 (ref 5–15)
BUN: 17 mg/dL (ref 8–23)
CO2: 31 mmol/L (ref 22–32)
Calcium: 10 mg/dL (ref 8.9–10.3)
Chloride: 104 mmol/L (ref 98–111)
Creatinine: 0.86 mg/dL (ref 0.44–1.00)
GFR, Estimated: >60 mL/min
Glucose, Bld: 92 mg/dL (ref 70–99)
Potassium: 3.7 mmol/L (ref 3.5–5.1)
Sodium: 141 mmol/L (ref 135–145)
Total Bilirubin: 0.5 mg/dL
Total Protein: 7.4 g/dL (ref 6.5–8.1)

## 2023-03-29 LAB — CBC WITH DIFFERENTIAL (CANCER CENTER ONLY)
Abs Immature Granulocytes: 0.02 10*3/uL (ref 0.00–0.07)
Basophils Absolute: 0.1 10*3/uL (ref 0.0–0.1)
Basophils Relative: 1 %
Eosinophils Absolute: 0.4 10*3/uL (ref 0.0–0.5)
Eosinophils Relative: 7 %
HCT: 37.8 % (ref 36.0–46.0)
Hemoglobin: 12 g/dL (ref 12.0–15.0)
Immature Granulocytes: 0 %
Lymphocytes Relative: 24 %
Lymphs Abs: 1.4 10*3/uL (ref 0.7–4.0)
MCH: 27.9 pg (ref 26.0–34.0)
MCHC: 31.7 g/dL (ref 30.0–36.0)
MCV: 87.9 fL (ref 80.0–100.0)
Monocytes Absolute: 0.6 10*3/uL (ref 0.1–1.0)
Monocytes Relative: 10 %
Neutro Abs: 3.5 10*3/uL (ref 1.7–7.7)
Neutrophils Relative %: 58 %
Platelet Count: 219 10*3/uL (ref 150–400)
RBC: 4.3 MIL/uL (ref 3.87–5.11)
RDW: 14 % (ref 11.5–15.5)
WBC Count: 6 10*3/uL (ref 4.0–10.5)
nRBC: 0 % (ref 0.0–0.2)

## 2023-03-29 MED ORDER — ANASTROZOLE 1 MG PO TABS: 1.0000 mg | ORAL_TABLET | Freq: Every day | ORAL | 3 refills | Status: AC

## 2023-03-29 NOTE — Progress Notes (Signed)
Patient Care Team: Shirline Frees, NP as PCP - General (Family Medicine) Marcelle Overlie, MD (Obstetrics and Gynecology) Pershing Proud, RN as Oncology Nurse Navigator Donnelly Angelica, RN as Oncology Nurse Navigator Griselda Miner, MD as Consulting Physician (General Surgery) Magrinat, Valentino Hue, MD (Inactive) as Consulting Physician (Oncology) Antony Blackbird, MD as Consulting Physician (Radiation Oncology)  DIAGNOSIS:  Encounter Diagnosis  Name Primary?   Malignant neoplasm of upper-outer quadrant of right breast in female, estrogen receptor positive (HCC) Yes    SUMMARY OF ONCOLOGIC HISTORY: Oncology History  Malignant neoplasm of upper-outer quadrant of right breast in female, estrogen receptor positive (HCC)  03/11/2020 Initial Diagnosis   Malignant neoplasm of upper-outer quadrant of right breast in female, estrogen receptor positive (HCC)   05/16/2020 Genetic Testing   Negative genetic testing.  EGFR c.3629C>T (p.Ala1210Val) and NTHL1 c.755C>G (p.Thr252Ser)  VUS identified.  The Multi-Gene Panel offered by [facility] includes sequencing and/or deletion duplication testing of the following 84 genes: AIP, ALK, APC, ATM, AXIN2,BAP1,  BARD1, BLM, BMPR1A, BRCA1, BRCA2, BRIP1, CASR, CDC73, CDH1, CDK4, CDKN1B, CDKN1C, CDKN2A (p14ARF), CDKN2A (p16INK4a), CEBPA, CHEK2, CTNNA1, DICER1, DIS3L2, EGFR (c.2369C>T, p.Thr790Met variant only), EPCAM (Deletion/duplication testing only), FH, FLCN, GATA2, GPC3, GREM1 (Promoter region deletion/duplication testing only), HOXB13 (c.251G>A, p.Gly84Glu), HRAS, KIT, MAX, MEN1, MET, MITF (c.952G>A, p.Glu318Lys variant only), MLH1, MSH2, MSH3, MSH6, MUTYH, NBN, NF1, NF2, NTHL1, PALB2, PDGFRA, PHOX2B, PMS2, POLD1, POLE, POT1, PRKAR1A, PTCH1, PTEN, RAD50, RAD51C, RAD51D, RB1, RECQL4, RET, RUNX1, SDHAF2, SDHA (sequence changes only), SDHB, SDHC, SDHD, SMAD4, SMARCA4, SMARCB1, SMARCE1, STK11, SUFU, TERC, TERT, TMEM127, TP53, TSC1, TSC2, VHL, WRN and WT1.  The report  date is May 16, 2020.     CHIEF COMPLIANT: Follow-up on anastrozole therapy  HISTORY OF PRESENT ILLNESS:  History of Present Illness   The patient, with a history of breast cancer, presents for a routine follow-up. She reports that she has been doing well overall, but has been struggling with weight gain and hot flashes, which she attributes to her medication, Arimidex. She expresses frustration with her inability to lose weight despite efforts to exercise and maintain a healthy diet. She also mentions that she often feels hot, even in cool environments, which she believes is a side effect of her medication.  In addition to her concerns about weight and hot flashes, the patient also reports having arthritis in her knee. She saw a specialist about two years ago who suggested that she might eventually need a partial knee replacement. However, she is hesitant about this option and has been managing her symptoms with occasional use of meloxicam and increased intake of turmeric. She also mentions using a kneecap and heat for relief.  The patient also discusses her ongoing anxiety about her breast cancer diagnosis and the effectiveness of her mammograms.         ALLERGIES:  is allergic to amoxicillin.  MEDICATIONS:  Current Outpatient Medications  Medication Sig Dispense Refill   acetaminophen (TYLENOL) 500 MG tablet Take 500 mg by mouth every 6 (six) hours as needed.     anastrozole (ARIMIDEX) 1 MG tablet Take 1 tablet (1 mg total) by mouth daily. 90 tablet 3   atorvastatin (LIPITOR) 10 MG tablet TAKE 1 TABLET BY MOUTH ONCE DAILY . APPOINTMENT REQUIRED FOR FUTURE REFILLS 90 tablet 0   Bioflavonoid Products (VITAMIN C PLUS) 1000 MG TABS Vitamin C     bisoprolol (ZEBETA) 10 MG tablet Take 1 tablet (10 mg total) by mouth daily. 90 tablet 3  Cetirizine HCl (ZYRTEC ALLERGY) 10 MG CAPS      Cholecalciferol 50 MCG (2000 UT) TABS Take 5,000 Units by mouth daily.     clonazePAM (KLONOPIN) 1 MG  tablet Take 1 mg by mouth every 8 (eight) hours as needed. for anxiety     Coenzyme Q10 (COQ10) 100 MG CAPS Take by mouth. (Patient not taking: Reported on 01/15/2023)     escitalopram (LEXAPRO) 10 MG tablet Take 10 mg by mouth daily.     hydrochlorothiazide (MICROZIDE) 12.5 MG capsule Take 1 capsule (12.5 mg total) by mouth daily. 90 capsule 3   ibuprofen (ADVIL) 600 MG tablet Take 1 tablet by mouth every 6 (six) hours as needed.     magnesium gluconate (MAGONATE) 500 MG tablet Take 500 mg by mouth daily.     meloxicam (MOBIC) 15 MG tablet Take 15 mg by mouth daily. (Patient not taking: Reported on 01/15/2023)     Turmeric (QC TUMERIC COMPLEX PO) Take 1,500 mg by mouth daily.     Zinc 50 MG CAPS zinc     No current facility-administered medications for this visit.   Facility-Administered Medications Ordered in Other Visits  Medication Dose Route Frequency Provider Last Rate Last Admin   Sonafine emulsion 1 application  1 application  Topical Once Antony Blackbird, MD        PHYSICAL EXAMINATION: ECOG PERFORMANCE STATUS: 1 - Symptomatic but completely ambulatory  Vitals:   03/29/23 1041  BP: 130/82  Pulse: 76  Resp: 18  Temp: 97.7 F (36.5 C)  SpO2: 99%   Filed Weights   03/29/23 1041  Weight: 157 lb 11.2 oz (71.5 kg)      LABORATORY DATA:  I have reviewed the data as listed    Latest Ref Rng & Units 10/05/2022    9:55 AM 03/28/2022    9:53 AM 09/29/2021   10:17 AM  CMP  Glucose 70 - 99 mg/dL 85  93  90   BUN 6 - 23 mg/dL 13  14  17    Creatinine 0.40 - 1.20 mg/dL 1.61  0.96  0.45   Sodium 135 - 145 mEq/L 141  140  140   Potassium 3.5 - 5.1 mEq/L 3.9  3.8  4.2   Chloride 96 - 112 mEq/L 102  105  104   CO2 19 - 32 mEq/L 30  29  30    Calcium 8.4 - 10.5 mg/dL 40.9  9.9  81.1   Total Protein 6.0 - 8.3 g/dL 7.5  7.1  7.2   Total Bilirubin 0.2 - 1.2 mg/dL 0.4  0.4  0.4   Alkaline Phos 39 - 117 U/L 76  66  62   AST 0 - 37 U/L 19  16  18    ALT 0 - 35 U/L 17  13  14      Lab  Results  Component Value Date   WBC 6.0 03/29/2023   HGB 12.0 03/29/2023   HCT 37.8 03/29/2023   MCV 87.9 03/29/2023   PLT 219 03/29/2023   NEUTROABS 3.5 03/29/2023    ASSESSMENT & PLAN:  Malignant neoplasm of upper-outer quadrant of right breast in female, estrogen receptor positive (HCC) Dr. Darnelle Catalan patient establishing oncology care with me 03/09/2020: Right breast UOQ: Stage Ia ILC ER/PR positive HER2 negative Ki-67 10% 06/09/2020: Right mastectomy: T3N0 I plus stage IIa grade 2 ILC with negative margins 1 lymph node with isolated tumor cells out of 7 Oncotype DX: Score 10: Risk of distant recurrence 3% 09/01/2020:  Completed adjuvant radiation 05/16/2020: Genetics: Negative   Current treatment: Anastrozole started November 2021 Anastrozole toxicities: Weight gain: We discussed at length about measures to decrease her weight Feeling hot Joint stiffness: Encouraged her to take turmeric.   Breast cancer surveillance: Mammogram 03/12/2023: Benign breast density category C Breast MRI April 2024: Benign  I ordered diagnostic mammograms and breast MRIs for next year      Breast Cancer Three years post-diagnosis, currently on Arimidex with side effects of hot flashes. Recent mammograms and MRIs have been satisfactory. -Continue Arimidex. -Order diagnostic mammogram for March 12, 2024 to provide immediate results and alleviate patient anxiety.  Weight Management Patient reports weight gain, possibly related to Arimidex. Acknowledges lack of exercise and dietary control. -Encourage dietary modifications and increased physical activity for weight loss and overall cardiovascular health.  Knee Arthritis Patient reports knee discomfort, previously diagnosed with arthritis. Occasionally uses Meloxicam and has increased intake of turmeric for symptom management. -Advise use of heat for non-swollen, painful joints and cold for swollen joints. -Encourage continued occasional use of  Meloxicam as needed.  General Health Maintenance -Continue CoQ10 for statin use. -Plan for annual follow-up after next mammogram.          Orders Placed This Encounter  Procedures   MR BREAST BILATERAL W WO CONTRAST INC CAD    Standing Status:   Future    Standing Expiration Date:   03/28/2024    Order Specific Question:   If indicated for the ordered procedure, I authorize the administration of contrast media per Radiology protocol    Answer:   Yes    Order Specific Question:   What is the patient's sedation requirement?    Answer:   No Sedation    Order Specific Question:   Does the patient have a pacemaker or implanted devices?    Answer:   No    Order Specific Question:   Preferred imaging location?    Answer:   [facility] (table limit-550lbs)    Order Specific Question:   Release to patient    Answer:   Immediate   MM DIAG BREAST TOMO UNI LEFT    Standing Status:   Future    Standing Expiration Date:   03/28/2024    Order Specific Question:   Reason for Exam (SYMPTOM  OR DIAGNOSIS REQUIRED)    Answer:   Breast discomfort annual eval    Order Specific Question:   Preferred imaging location?    Answer:   [facility]    Order Specific Question:   Release to patient    Answer:   Immediate   The patient has a good understanding of the overall plan. she agrees with it. she will call with any problems that may develop before the next visit here. Total time spent: 30 mins including face to face time and time spent for planning, charting and co-ordination of care   Tamsen Meek, MD 03/29/23

## 2023-03-29 NOTE — Assessment & Plan Note (Addendum)
Dr. Darnelle Catalan patient establishing oncology care with me 03/09/2020: Right breast UOQ: Stage Ia ILC ER/PR positive HER2 negative Ki-67 10% 06/09/2020: Right mastectomy: T3N0 I plus stage IIa grade 2 ILC with negative margins 1 lymph node with isolated tumor cells out of 7 Oncotype DX: Score 10: Risk of distant recurrence 3% 09/01/2020: Completed adjuvant radiation 05/16/2020: Genetics: Negative   Current treatment: Anastrozole started November 2021 Anastrozole toxicities: Weight gain: We discussed at length about measures to decrease her weight Feeling hot Joint stiffness: Encouraged her to take turmeric.   Breast cancer surveillance: Mammogram 03/12/2023: Benign breast density category C Breast MRI April 2024: Benign  I ordered diagnostic mammograms and breast MRIs for next year   Return to clinic in 1 year for follow-up

## 2023-04-30 ENCOUNTER — Ambulatory Visit: Payer: Medicare Other | Attending: General Surgery

## 2023-04-30 VITALS — Wt 158.5 lb

## 2023-04-30 DIAGNOSIS — Z483 Aftercare following surgery for neoplasm: Secondary | ICD-10-CM | POA: Insufficient documentation

## 2023-04-30 NOTE — Therapy (Signed)
OUTPATIENT PHYSICAL THERAPY SOZO SCREENING NOTE   Patient Name: Terri Gay MRN: [id number] DOB:06-08-1950, 72 y.o., female Today's Date: 04/30/2023  PCP: Shirline Frees, NP REFERRING PROVIDER: Griselda Miner, MD   PT End of Session - 04/30/23 1058     Visit Number 2   # unchanged due to screen only   PT Start Time 1056    PT Stop Time 1100    PT Time Calculation (min) 4 min    Activity Tolerance Patient tolerated treatment well    Behavior During Therapy [facility] for tasks assessed/performed             Past Medical History:  Diagnosis Date   Allergy    seasonal   Anxiety    Arthritis    Breast cancer (HCC)    Cancer (HCC) 2021   right breast IDC with DCIS   Depression    Diverticulosis 10/07/2010   Colonoscopy   GERD (gastroesophageal reflux disease)    OTC   Hemorrhoids    History of chicken pox    History of radiation therapy 07/21/2020-09/01/2020   Right breast- Dr. Antony Blackbird   Hyperlipidemia    Hypertension    Knee pain, left 10/24/2013   Osteopenia 04/25/2013   Osteoporosis    Palpitations 04/26/2013   Preventative health care 04/26/2013   Past Surgical History:  Procedure Laterality Date   MASTECTOMY     MASTECTOMY W/ SENTINEL NODE BIOPSY Right 06/09/2020   Procedure: RIGHT MASTECTOMY WITH RIGHT AXILLARY SENTINEL LYMPH NODE BIOPSY;  Surgeon: Griselda Miner, MD;  Location: [facility];  Service: General;  Laterality: Right;   NO PAST SURGERIES     Patient Active Problem List   Diagnosis Date Noted   Cancer of right female breast (HCC) 06/09/2020   Genetic testing 05/17/2020   Malignant neoplasm of upper-outer quadrant of right breast in female, estrogen receptor positive (HCC) 03/11/2020   Knee pain, left 10/24/2013   Palpitations 04/26/2013   Preventative health care 04/26/2013   Osteopenia 04/25/2013   Acute esophagitis 09/19/2012   GERD (gastroesophageal reflux disease) 03/03/2012   Essential hypertension 08/23/2011   Rectal  bleeding 06/23/2011   Hyperlipidemia 03/02/2011   Depression with anxiety 01/26/2011   Insomnia 01/26/2011   Diverticulosis 01/17/2011   Hemorrhoids 01/17/2011   Osteoarthritis 01/17/2011    REFERRING DIAG: right breast cancer at risk for lymphedema  THERAPY DIAG: Aftercare following surgery for neoplasm  PERTINENT HISTORY: Patient was diagnosed on 03/01/2020 with right grade II invasive lobular carcinoma breast cancer. She underwent a right mastectomy and sentinel node biopsy (1/6 positive axillary lymph nodes) on 06/09/2020. It is ER/PR positive and HER2 negative with a Ki67 of 10%.    PRECAUTIONS: right UE Lymphedema risk  SUBJECTIVE: Pt returns for her 6 month L-Dex screen.   PAIN:  Are you having pain? No  SOZO SCREENING: Patient was assessed today using the SOZO machine to determine the lymphedema index score. This was compared to her baseline score. It was determined that she is within the recommended range when compared to her baseline and no further action is needed at this time. She will continue SOZO screenings. These are done every 3 months for 2 years post operatively followed by every 6 months for 2 years, and then annually.   L-DEX FLOWSHEETS - 04/30/23 1000       L-DEX LYMPHEDEMA SCREENING   Measurement Type Unilateral    L-DEX MEASUREMENT EXTREMITY Upper Extremity    POSITION  Standing  DOMINANT SIDE Right    At Risk Side Right    BASELINE SCORE (UNILATERAL) -6.1    L-DEX SCORE (UNILATERAL) -5.3    VALUE CHANGE (UNILAT) 0.8             Berna Spare, PTA 04/30/23 11:01 AM

## 2023-05-10 DIAGNOSIS — Z853 Personal history of malignant neoplasm of breast: Secondary | ICD-10-CM | POA: Diagnosis not present

## 2023-07-31 DIAGNOSIS — Z170 Estrogen receptor positive status [ER+]: Secondary | ICD-10-CM | POA: Diagnosis not present

## 2023-07-31 DIAGNOSIS — C50411 Malignant neoplasm of upper-outer quadrant of right female breast: Secondary | ICD-10-CM | POA: Diagnosis not present

## 2023-09-05 ENCOUNTER — Ambulatory Visit
Admission: RE | Admit: 2023-09-05 | Discharge: 2023-09-05 | Disposition: A | Discharge status: Home / Self Care | Payer: Medicare Other | Source: Ambulatory Visit | Attending: Hematology and Oncology | Admitting: Hematology and Oncology

## 2023-09-05 DIAGNOSIS — Z170 Estrogen receptor positive status [ER+]: Secondary | ICD-10-CM

## 2023-09-05 DIAGNOSIS — Z853 Personal history of malignant neoplasm of breast: Secondary | ICD-10-CM | POA: Diagnosis not present

## 2023-09-05 DIAGNOSIS — C50411 Malignant neoplasm of upper-outer quadrant of right female breast: Secondary | ICD-10-CM

## 2023-09-05 DIAGNOSIS — Z1239 Encounter for other screening for malignant neoplasm of breast: Secondary | ICD-10-CM | POA: Diagnosis not present

## 2023-09-05 MED ORDER — GADOPICLENOL 0.5 MMOL/ML IV SOLN
7.0000 mL | Freq: Once | INTRAVENOUS | Status: AC | PRN
  Administered 2023-09-05: 7 mL via INTRAVENOUS

## 2023-10-09 ENCOUNTER — Ambulatory Visit

## 2023-10-09 ENCOUNTER — Encounter: Admitting: Adult Health

## 2023-10-25 ENCOUNTER — Ambulatory Visit (INDEPENDENT_AMBULATORY_CARE_PROVIDER_SITE_OTHER): Admitting: Adult Health

## 2023-10-25 ENCOUNTER — Ambulatory Visit: Payer: Self-pay | Admitting: Adult Health

## 2023-10-25 ENCOUNTER — Encounter: Payer: Self-pay | Admitting: Adult Health

## 2023-10-25 VITALS — BP 120/82 | HR 73 | Temp 98.5°F | Ht 61.5 in | Wt 158.0 lb

## 2023-10-25 DIAGNOSIS — I10 Essential (primary) hypertension: Secondary | ICD-10-CM | POA: Diagnosis not present

## 2023-10-25 DIAGNOSIS — F418 Other specified anxiety disorders: Secondary | ICD-10-CM

## 2023-10-25 DIAGNOSIS — G4701 Insomnia due to medical condition: Secondary | ICD-10-CM | POA: Diagnosis not present

## 2023-10-25 DIAGNOSIS — E785 Hyperlipidemia, unspecified: Secondary | ICD-10-CM | POA: Diagnosis not present

## 2023-10-25 DIAGNOSIS — K648 Other hemorrhoids: Secondary | ICD-10-CM

## 2023-10-25 DIAGNOSIS — Z170 Estrogen receptor positive status [ER+]: Secondary | ICD-10-CM

## 2023-10-25 DIAGNOSIS — Z0000 Encounter for general adult medical examination without abnormal findings: Secondary | ICD-10-CM

## 2023-10-25 DIAGNOSIS — C50911 Malignant neoplasm of unspecified site of right female breast: Secondary | ICD-10-CM

## 2023-10-25 LAB — CBC WITH DIFFERENTIAL/PLATELET
Basophils Absolute: 0.1 10*3/uL (ref 0.0–0.1)
Basophils Relative: 1 % (ref 0.0–3.0)
Eosinophils Absolute: 0.5 10*3/uL (ref 0.0–0.7)
Eosinophils Relative: 5.5 % — ABNORMAL HIGH (ref 0.0–5.0)
HCT: 36.5 % (ref 36.0–46.0)
Hemoglobin: 12.1 g/dL (ref 12.0–15.0)
Lymphocytes Relative: 20.2 % (ref 12.0–46.0)
Lymphs Abs: 1.7 10*3/uL (ref 0.7–4.0)
MCHC: 33 g/dL (ref 30.0–36.0)
MCV: 84.5 fl (ref 78.0–100.0)
Monocytes Absolute: 0.5 10*3/uL (ref 0.1–1.0)
Monocytes Relative: 6.1 % (ref 3.0–12.0)
Neutro Abs: 5.6 10*3/uL (ref 1.4–7.7)
Neutrophils Relative %: 67.2 % (ref 43.0–77.0)
Platelets: 236 10*3/uL (ref 150.0–400.0)
RBC: 4.32 Mil/uL (ref 3.87–5.11)
RDW: 14.1 % (ref 11.5–15.5)
WBC: 8.4 10*3/uL (ref 4.0–10.5)

## 2023-10-25 LAB — COMPREHENSIVE METABOLIC PANEL WITH GFR
ALT: 23 U/L (ref 0–35)
AST: 21 U/L (ref 0–37)
Albumin: 4.4 g/dL (ref 3.5–5.2)
Alkaline Phosphatase: 83 U/L (ref 39–117)
BUN: 16 mg/dL (ref 6–23)
CO2: 27 meq/L (ref 19–32)
Calcium: 10.1 mg/dL (ref 8.4–10.5)
Chloride: 103 meq/L (ref 96–112)
Creatinine, Ser: 0.81 mg/dL (ref 0.40–1.20)
GFR: 73.09 mL/min
Glucose, Bld: 96 mg/dL (ref 70–99)
Potassium: 3.8 meq/L (ref 3.5–5.1)
Sodium: 143 meq/L (ref 135–145)
Total Bilirubin: 0.5 mg/dL (ref 0.2–1.2)
Total Protein: 7.1 g/dL (ref 6.0–8.3)

## 2023-10-25 LAB — LIPID PANEL
Cholesterol: 162 mg/dL (ref 0–200)
HDL: 49.8 mg/dL
LDL Cholesterol: 92 mg/dL (ref 0–99)
NonHDL: 112.37
Total CHOL/HDL Ratio: 3
Triglycerides: 101 mg/dL (ref 0.0–149.0)
VLDL: 20.2 mg/dL (ref 0.0–40.0)

## 2023-10-25 LAB — TSH: TSH: 1.36 u[IU]/mL (ref 0.35–5.50)

## 2023-10-25 MED ORDER — BISOPROLOL FUMARATE 10 MG PO TABS: 10.0000 mg | ORAL_TABLET | Freq: Every day | ORAL | 3 refills | Status: AC

## 2023-10-25 MED ORDER — ATORVASTATIN CALCIUM 10 MG PO TABS: 10.0000 mg | ORAL_TABLET | Freq: Every day | ORAL | 3 refills | Status: AC

## 2023-10-25 MED ORDER — HYDROCHLOROTHIAZIDE 12.5 MG PO CAPS: 12.5000 mg | ORAL_CAPSULE | Freq: Every day | ORAL | 3 refills | Status: AC

## 2023-10-25 MED ORDER — HYDROCORTISONE ACETATE 25 MG RE SUPP: 25.0000 mg | Freq: Two times a day (BID) | RECTAL | 0 refills | Status: DC

## 2023-10-25 NOTE — Patient Instructions (Signed)
 It was great seeing you today   We will follow up with you regarding your lab work   Please let me know if you need anything

## 2023-10-25 NOTE — Progress Notes (Signed)
 Subjective:    Patient ID: Terri Gay, female    DOB: 28-Jun-1950, 73 y.o.   MRN: [id number]  HPI Patient presents for yearly preventative medicine examination. She is a pleasant 73 year old female who  has a past medical history of Allergy, Anxiety, Arthritis, Breast cancer (HCC), Cancer (HCC) (2021), Depression, Diverticulosis (10/07/2010), GERD (gastroesophageal reflux disease), Hemorrhoids, History of chicken pox, History of radiation therapy (07/21/2020-09/01/2020), Hyperlipidemia, Hypertension, Knee pain, left (10/24/2013), Osteopenia (04/25/2013), Osteoporosis, Palpitations (04/26/2013), and Preventative health care (04/26/2013).  Hypertension-controlled well with Zebeta  10 mg daily and a HCTZ 12.5 mg daily.  He denies dizziness, lightheadedness, chest pain, headaches, shortness of breath, or syncopal episodes BP Readings from Last 3 Encounters:  10/25/23 120/82  03/29/23 130/82  02/15/23 120/84   Anxiety/depression/insomnia-takes Klonopin  0.5 mg, 1 tablet in the morning and 2 tablets at bedtime as needed.  She is also prescribed Lexapro 10 mg daily by oncology  Hyperlipidemia-takes Lipitor -10 mg every day.  Lab Results  Component Value Date   CHOL 174 10/05/2022   HDL 50.60 10/05/2022   LDLCALC 99 10/05/2022   TRIG 121.0 10/05/2022   CHOLHDL 3 10/05/2022   H/o breast cancer -diagnosed in October 2021.  She underwent a right mastectomy with right axillary sentinel lymph node biopsy in January 2022.  Completed 30 rounds of radiation therapy.  Currently taking Arimidex  1 mg daily.  Internal Hemorrhoids - has infrequent flares. She recently had a colonoscopy and feels as though since the colonoscopy she has had irritation and bleeding. Has used Anusol  suppositories in the past with good results.   All immunizations and health maintenance protocols were reviewed with the patient and needed orders were placed.  Appropriate screening laboratory values were ordered for the patient  including screening of hyperlipidemia, renal function and hepatic function.   Medication reconciliation,  past medical history, social history, problem list and allergies were reviewed in detail with the patient  Goals were established with regard to weight loss, exercise, and  diet in compliance with medications  Wt Readings from Last 3 Encounters:  10/25/23 158 lb (71.7 kg)  04/30/23 158 lb 8 oz (71.9 kg)  03/29/23 157 lb 11.2 oz (71.5 kg)   She is up to date on routine colon cancer screening and mammograms.     Review of Systems  Constitutional: Negative.   HENT: Negative.    Eyes: Negative.   Respiratory: Negative.    Cardiovascular: Negative.   Gastrointestinal:  Positive for blood in stool and rectal pain.  Endocrine: Negative.   Genitourinary: Negative.   Musculoskeletal: Negative.   Skin: Negative.   Allergic/Immunologic: Negative.   Neurological: Negative.   Hematological: Negative.   Psychiatric/Behavioral: Negative.     Past Medical History:  Diagnosis Date   Allergy    seasonal   Anxiety    Arthritis    Breast cancer (HCC)    Cancer (HCC) 2021   right breast IDC with DCIS   Depression    Diverticulosis 10/07/2010   Colonoscopy   GERD (gastroesophageal reflux disease)    OTC   Hemorrhoids    History of chicken pox    History of radiation therapy 07/21/2020-09/01/2020   Right breast- Dr. Retta Caster   Hyperlipidemia    Hypertension    Knee pain, left 10/24/2013   Osteopenia 04/25/2013   Osteoporosis    Palpitations 04/26/2013   Preventative health care 04/26/2013    Social History   Socioeconomic History   Marital status: Married  Spouse name: Not on file   Number of children: 2   Years of education: Not on file   Highest education level: 12th grade  Occupational History   Occupation: Customer Service    Employer: [employer]    Comment: retired  Tobacco Use   Smoking status: Never   Smokeless tobacco: Never  Vaping Use    Vaping status: Never Used  Substance and Sexual Activity   Alcohol use: Yes    Alcohol/week: 1.0 standard drink of alcohol    Types: 1 Glasses of wine per week    Comment: occasionally   Drug use: No   Sexual activity: Yes    Birth control/protection: Post-menopausal  Other Topics Concern   Not on file  Social History Narrative   Retired from [employer]    Married for 27 years    Have one son and one daughter ( live in [place])       She likes to dance, read, and play with her dogs.          Caffeine Use: 1 cup coffee and 1 cup tea      Social Drivers of Health   Financial Resource Strain: Medium Risk (10/20/2023)   Overall Financial Resource Strain (CARDIA)    Difficulty of Paying Living Expenses: Somewhat hard  Food Insecurity: No Food Insecurity (10/20/2023)   Hunger Vital Sign    Worried About Running Out of Food in the Last Year: Never true    Ran Out of Food in the Last Year: Never true  Transportation Needs: No Transportation Needs (10/20/2023)   PRAPARE - Administrator, Civil Service (Medical): No    Lack of Transportation (Non-Medical): No  Physical Activity: Unknown (10/20/2023)   Exercise Vital Sign    Days of Exercise per Week: 1 day    Minutes of Exercise per Session: Patient declined  Stress: No Stress Concern Present (10/20/2023)   [employer] of Occupational Health - Occupational Stress Questionnaire    Feeling of Stress : Only a little  Social Connections: Socially Integrated (10/20/2023)   Social Connection and Isolation Panel [NHANES]    Frequency of Communication with Friends and Family: More than three times a week    Frequency of Social Gatherings with Friends and Family: Once a week    Attends Religious Services: More than 4 times per year    Active Member of [employer] or Organizations: Yes    Attends Engineer, structural: More than 4 times per year    Marital Status: Married  Catering manager Violence: Not At Risk  (06/06/2021)   Humiliation, Afraid, Rape, and Kick questionnaire    Fear of Current or Ex-Partner: No    Emotionally Abused: No    Physically Abused: No    Sexually Abused: No    Past Surgical History:  Procedure Laterality Date   MASTECTOMY     MASTECTOMY W/ SENTINEL NODE BIOPSY Right 06/09/2020   Procedure: RIGHT MASTECTOMY WITH RIGHT AXILLARY SENTINEL LYMPH NODE BIOPSY;  Surgeon: Caralyn Chandler, MD;  Location: [facility];  Service: General;  Laterality: Right;   NO PAST SURGERIES      Family History  Problem Relation Age of Onset   Diabetes Mother    Arthritis Mother    Hyperlipidemia Mother    Hypertension Mother    Heart disease Mother        CHF   Lung cancer Father        d.  66   Hyperlipidemia Sister    Diabetes Maternal Grandmother    Heart disease Maternal Grandmother    Arthritis Maternal Grandmother    Hyperlipidemia Maternal Grandmother    Hypertension Maternal Grandmother    Arthritis Paternal Grandmother    Lung cancer Paternal Grandfather        smoker   Heart disease Daughter        arrythmia, Internal Cardiac Defibrillator   Lupus Son    Drug abuse Son    Colon cancer Neg Hx    Esophageal cancer Neg Hx    Rectal cancer Neg Hx    Stomach cancer Neg Hx    Colon polyps Neg Hx     Allergies  Allergen Reactions   Amoxicillin Diarrhea    Current Outpatient Medications on File Prior to Visit  Medication Sig Dispense Refill   anastrozole  (ARIMIDEX ) 1 MG tablet Take 1 tablet (1 mg total) by mouth daily. 90 tablet 3   atorvastatin  (LIPITOR) 10 MG tablet TAKE 1 TABLET BY MOUTH ONCE DAILY . APPOINTMENT REQUIRED FOR FUTURE REFILLS 90 tablet 0   Bioflavonoid Products (VITAMIN C PLUS) 1000 MG TABS Vitamin C     bisoprolol  (ZEBETA ) 10 MG tablet Take 1 tablet (10 mg total) by mouth daily. 90 tablet 3   clonazePAM  (KLONOPIN ) 1 MG tablet Take 1 mg by mouth every 8 (eight) hours as needed. for anxiety     Coenzyme Q10 (COQ10) 100 MG CAPS Take by  mouth.     escitalopram (LEXAPRO) 10 MG tablet Take 10 mg by mouth daily.     hydrochlorothiazide  (MICROZIDE ) 12.5 MG capsule Take 1 capsule (12.5 mg total) by mouth daily. 90 capsule 3   magnesium gluconate (MAGONATE) 500 MG tablet Take 500 mg by mouth daily.     Vitamin D -Vitamin K (VITAMIN K2-VITAMIN D3 PO) Take by mouth.     Zinc 50 MG CAPS zinc     acetaminophen  (TYLENOL ) 500 MG tablet Take 500 mg by mouth every 6 (six) hours as needed. (Patient not taking: Reported on 10/25/2023)     Cetirizine HCl (ZYRTEC ALLERGY) 10 MG CAPS  (Patient not taking: Reported on 10/25/2023)     ibuprofen (ADVIL) 600 MG tablet Take 1 tablet by mouth every 6 (six) hours as needed. (Patient not taking: Reported on 10/25/2023)     Current Facility-Administered Medications on File Prior to Visit  Medication Dose Route Frequency Provider Last Rate Last Admin   Sonafine emulsion 1 application  1 application  Topical Once Kinard, James, MD        BP 120/82   Pulse 73   Temp 98.5 F (36.9 C) (Oral)   Ht 5' 1.5" (1.562 m)   Wt 158 lb (71.7 kg)   SpO2 97%   BMI 29.37 kg/m       Objective:   Physical Exam Vitals and nursing note reviewed.  Constitutional:      General: She is not in acute distress.    Appearance: Normal appearance. She is not ill-appearing.  HENT:     Head: Normocephalic and atraumatic.     Right Ear: Tympanic membrane, ear canal and external ear normal. There is no impacted cerumen.     Left Ear: Tympanic membrane, ear canal and external ear normal. There is no impacted cerumen.     Nose: Nose normal. No congestion or rhinorrhea.     Mouth/Throat:     Mouth: Mucous membranes are moist.     Pharynx: Oropharynx is clear.  Eyes:     Extraocular Movements: Extraocular movements intact.     Conjunctiva/sclera: Conjunctivae normal.     Pupils: Pupils are equal, round, and reactive to light.  Neck:     Vascular: No carotid bruit.  Cardiovascular:     Rate and Rhythm: Normal rate and  regular rhythm.     Pulses: Normal pulses.     Heart sounds: No murmur heard.    No friction rub. No gallop.  Pulmonary:     Effort: Pulmonary effort is normal.     Breath sounds: Normal breath sounds.  Abdominal:     General: Abdomen is flat. Bowel sounds are normal. There is no distension.     Palpations: Abdomen is soft. There is no mass.     Tenderness: There is no abdominal tenderness. There is no guarding or rebound.     Hernia: No hernia is present.  Musculoskeletal:        General: Normal range of motion.     Cervical back: Normal range of motion and neck supple.  Lymphadenopathy:     Cervical: No cervical adenopathy.  Skin:    General: Skin is warm and dry.     Capillary Refill: Capillary refill takes less than 2 seconds.  Neurological:     General: No focal deficit present.     Mental Status: She is alert and oriented to person, place, and time.  Psychiatric:        Mood and Affect: Mood normal.        Behavior: Behavior normal.        Thought Content: Thought content normal.        Judgment: Judgment normal.        Assessment & Plan:  1. Routine general medical examination at a health care facility (Primary) Today patient counseled on age appropriate routine health concerns for screening and prevention, each reviewed and up to date or declined. Immunizations reviewed and up to date or declined. Labs ordered and reviewed. Risk factors for depression reviewed and negative. Hearing function and visual acuity are intact. ADLs screened and addressed as needed. Functional ability and level of safety reviewed and appropriate. Education, counseling and referrals performed based on assessed risks today. Patient provided with a copy of personalized plan for preventive services. - Advised pneumonia and shingles vaccinations at pharmacy  - Stay active and eat healthy - Follow up in one year or sooner if needed   2. Essential hypertension - Well controlled. No change in  medications - CBC with Differential/Platelet; Future - Comprehensive metabolic panel with GFR; Future - Lipid panel; Future - TSH; Future - bisoprolol  (ZEBETA ) 10 MG tablet; Take 1 tablet (10 mg total) by mouth daily.  Dispense: 90 tablet; Refill: 3 - hydrochlorothiazide  (MICROZIDE ) 12.5 MG capsule; Take 1 capsule (12.5 mg total) by mouth daily.  Dispense: 90 capsule; Refill: 3  3. Depression with anxiety - Continue with Celexa and Klonopin   - CBC with Differential/Platelet; Future - Comprehensive metabolic panel with GFR; Future - Lipid panel; Future - TSH; Future  4. Insomnia due to medical condition - Continue with Klonopin   - CBC with Differential/Platelet; Future - Comprehensive metabolic panel with GFR; Future - Lipid panel; Future - TSH; Future  5. Hyperlipidemia, unspecified hyperlipidemia type - Continue with statin  - CBC with Differential/Platelet; Future - Comprehensive metabolic panel with GFR; Future - Lipid panel; Future - TSH; Future - atorvastatin  (LIPITOR) 10 MG tablet; Take 1 tablet (10 mg total) by mouth daily.  Dispense: 90 tablet; Refill: 3  6. Malignant neoplasm of right breast in female, estrogen receptor positive, unspecified site of breast (HCC) - Doing well. Has one more year of Armidex.  - CBC with Differential/Platelet; Future - Comprehensive metabolic panel with GFR; Future - Lipid panel; Future - TSH; Future  7. Internal hemorrhoids - needs refill  - hydrocortisone  (ANUSOL -HC) 25 MG suppository; Place 1 suppository (25 mg total) rectally 2 (two) times daily.  Dispense: 24 suppository; Refill: 0  Alto Atta, NP

## 2023-10-29 ENCOUNTER — Ambulatory Visit: Payer: Medicare Other | Attending: General Surgery

## 2023-10-29 VITALS — Wt 159.2 lb

## 2023-10-29 DIAGNOSIS — Z483 Aftercare following surgery for neoplasm: Secondary | ICD-10-CM | POA: Insufficient documentation

## 2023-10-29 NOTE — Therapy (Signed)
 OUTPATIENT PHYSICAL THERAPY SOZO SCREENING NOTE   Patient Name: Terri Gay MRN: [id number] DOB:[birth year], 73 y.o., female Today's Date: 10/29/2023  PCP: Alto Atta, NP REFERRING PROVIDER: Caralyn Chandler, MD   PT End of Session - 10/29/23 1038     Visit Number 2   # unchanged due to screen only   PT Start Time 1036    PT Stop Time 1040    PT Time Calculation (min) 4 min    Activity Tolerance Patient tolerated treatment well    Behavior During Therapy [facility] for tasks assessed/performed             Past Medical History:  Diagnosis Date   Allergy    seasonal   Anxiety    Arthritis    Breast cancer (HCC)    Cancer (HCC) 2021   right breast IDC with DCIS   Depression    Diverticulosis 10/07/2010   Colonoscopy   GERD (gastroesophageal reflux disease)    OTC   Hemorrhoids    History of chicken pox    History of radiation therapy 07/21/2020-09/01/2020   Right breast- Dr. Retta Caster   Hyperlipidemia    Hypertension    Knee pain, left 10/24/2013   Osteopenia 04/25/2013   Osteoporosis    Palpitations 04/26/2013   Preventative health care 04/26/2013   Past Surgical History:  Procedure Laterality Date   MASTECTOMY     MASTECTOMY W/ SENTINEL NODE BIOPSY Right 06/09/2020   Procedure: RIGHT MASTECTOMY WITH RIGHT AXILLARY SENTINEL LYMPH NODE BIOPSY;  Surgeon: Caralyn Chandler, MD;  Location: [facility];  Service: General;  Laterality: Right;   NO PAST SURGERIES     Patient Active Problem List   Diagnosis Date Noted   Cancer of right female breast (HCC) 06/09/2020   Genetic testing 05/17/2020   Malignant neoplasm of upper-outer quadrant of right breast in female, estrogen receptor positive (HCC) 03/11/2020   Knee pain, left 10/24/2013   Palpitations 04/26/2013   Preventative health care 04/26/2013   Osteopenia 04/25/2013   Acute esophagitis 09/19/2012   GERD (gastroesophageal reflux disease) 03/03/2012   Essential hypertension 08/23/2011   Rectal  bleeding 06/23/2011   Hyperlipidemia 03/02/2011   Depression with anxiety 01/26/2011   Insomnia 01/26/2011   Diverticulosis 01/17/2011   Hemorrhoids 01/17/2011   Osteoarthritis 01/17/2011    REFERRING DIAG: right breast cancer at risk for lymphedema  THERAPY DIAG: Aftercare following surgery for neoplasm  PERTINENT HISTORY: Patient was diagnosed on 03/01/2020 with right grade II invasive lobular carcinoma breast cancer. She underwent a right mastectomy and sentinel node biopsy (1/6 positive axillary lymph nodes) on 06/09/2020. It is ER/PR positive and HER2 negative with a Ki67 of 10%.    PRECAUTIONS: right UE Lymphedema risk  SUBJECTIVE: Pt returns for her 6 month L-Dex screen.   PAIN:  Are you having pain? No  SOZO SCREENING: Patient was assessed today using the SOZO machine to determine the lymphedema index score. This was compared to her baseline score. It was determined that she is within the recommended range when compared to her baseline and no further action is needed at this time. She will continue SOZO screenings. These are done every 3 months for 2 years post operatively followed by every 6 months for 2 years, and then annually.   L-DEX FLOWSHEETS - 10/29/23 1000       L-DEX LYMPHEDEMA SCREENING   Measurement Type Unilateral    L-DEX MEASUREMENT EXTREMITY Upper Extremity    POSITION  Standing  DOMINANT SIDE Right    At Risk Side Right    BASELINE SCORE (UNILATERAL) -6.1    L-DEX SCORE (UNILATERAL) -4.5    VALUE CHANGE (UNILAT) 1.6             Roslynn Coombes, PTA 10/29/23 10:39 AM

## 2024-03-10 ENCOUNTER — Ambulatory Visit
Admission: RE | Admit: 2024-03-10 | Discharge: 2024-03-10 | Disposition: A | Source: Ambulatory Visit | Attending: Hematology and Oncology

## 2024-03-10 DIAGNOSIS — C50411 Malignant neoplasm of upper-outer quadrant of right female breast: Secondary | ICD-10-CM

## 2024-03-10 DIAGNOSIS — R928 Other abnormal and inconclusive findings on diagnostic imaging of breast: Secondary | ICD-10-CM | POA: Diagnosis not present

## 2024-03-10 DIAGNOSIS — Z853 Personal history of malignant neoplasm of breast: Secondary | ICD-10-CM | POA: Diagnosis not present

## 2024-04-01 ENCOUNTER — Inpatient Hospital Stay: Payer: Medicare Other | Attending: Hematology and Oncology | Admitting: Hematology and Oncology

## 2024-04-01 VITALS — BP 120/84 | HR 73 | Temp 97.7°F | Resp 16 | Ht 61.5 in | Wt 163.0 lb

## 2024-04-01 DIAGNOSIS — Z1732 Human epidermal growth factor receptor 2 negative status: Secondary | ICD-10-CM | POA: Diagnosis not present

## 2024-04-01 DIAGNOSIS — C50411 Malignant neoplasm of upper-outer quadrant of right female breast: Secondary | ICD-10-CM | POA: Diagnosis not present

## 2024-04-01 DIAGNOSIS — Z1721 Progesterone receptor positive status: Secondary | ICD-10-CM | POA: Insufficient documentation

## 2024-04-01 DIAGNOSIS — Z170 Estrogen receptor positive status [ER+]: Secondary | ICD-10-CM | POA: Diagnosis not present

## 2024-04-01 DIAGNOSIS — Z79811 Long term (current) use of aromatase inhibitors: Secondary | ICD-10-CM | POA: Diagnosis not present

## 2024-04-01 DIAGNOSIS — Z923 Personal history of irradiation: Secondary | ICD-10-CM | POA: Insufficient documentation

## 2024-04-01 DIAGNOSIS — M8580 Other specified disorders of bone density and structure, unspecified site: Secondary | ICD-10-CM | POA: Diagnosis not present

## 2024-04-01 DIAGNOSIS — Z79899 Other long term (current) drug therapy: Secondary | ICD-10-CM | POA: Insufficient documentation

## 2024-04-01 DIAGNOSIS — Z9011 Acquired absence of right breast and nipple: Secondary | ICD-10-CM | POA: Diagnosis not present

## 2024-04-01 NOTE — Progress Notes (Signed)
 Patient Care Team: Merna Huxley, NP as PCP - General (Family Medicine) Mat Browning, MD (Obstetrics and Gynecology) Tyree Nanetta SAILOR, RN as Oncology Nurse Navigator Curvin Deward MOULD, MD as Consulting Physician (General Surgery) Shannon Agent, MD as Consulting Physician (Radiation Oncology)  DIAGNOSIS:  Encounter Diagnosis  Name Primary?   Malignant neoplasm of upper-outer quadrant of right breast in female, estrogen receptor positive (HCC) Yes    SUMMARY OF ONCOLOGIC HISTORY: Oncology History  Malignant neoplasm of upper-outer quadrant of right breast in female, estrogen receptor positive (HCC)  03/11/2020 Initial Diagnosis   Malignant neoplasm of upper-outer quadrant of right breast in female, estrogen receptor positive (HCC)   05/16/2020 Genetic Testing   Negative genetic testing.  EGFR c.3629C>T (p.Ala1210Val) and NTHL1 c.755C>G (p.Thr252Ser)  VUS identified.  The Multi-Gene Panel offered by [facility] includes sequencing and/or deletion duplication testing of the following 84 genes: AIP, ALK, APC, ATM, AXIN2,BAP1,  BARD1, BLM, BMPR1A, BRCA1, BRCA2, BRIP1, CASR, CDC73, CDH1, CDK4, CDKN1B, CDKN1C, CDKN2A (p14ARF), CDKN2A (p16INK4a), CEBPA, CHEK2, CTNNA1, DICER1, DIS3L2, EGFR (c.2369C>T, p.Thr790Met variant only), EPCAM (Deletion/duplication testing only), FH, FLCN, GATA2, GPC3, GREM1 (Promoter region deletion/duplication testing only), HOXB13 (c.251G>A, p.Gly84Glu), HRAS, KIT, MAX, MEN1, MET, MITF (c.952G>A, p.Glu318Lys variant only), MLH1, MSH2, MSH3, MSH6, MUTYH, NBN, NF1, NF2, NTHL1, PALB2, PDGFRA, PHOX2B, PMS2, POLD1, POLE, POT1, PRKAR1A, PTCH1, PTEN, RAD50, RAD51C, RAD51D, RB1, RECQL4, RET, RUNX1, SDHAF2, SDHA (sequence changes only), SDHB, SDHC, SDHD, SMAD4, SMARCA4, SMARCB1, SMARCE1, STK11, SUFU, TERC, TERT, TMEM127, TP53, TSC1, TSC2, VHL, WRN and WT1.  The report date is May 16, 2020.     CHIEF COMPLIANT: Follow-up on anastrozole  therapy  HISTORY OF PRESENT  ILLNESS: History of Present Illness Terri Gay is a 73 year old female with breast cancer who presents for follow-up regarding her ongoing treatment with Arimidex .  She has been on Arimidex  for four years as part of her breast cancer treatment. She experiences weight gain, which she attributes to a lack of exercise. She is considering alternative forms of exercise, such as dancing or using a trampoline.  She has a history of osteopenia and is concerned about progression to osteoporosis, especially since anastrozole  can affect bone density. She has not had a bone density scan since 2022 and is due for one. She is considering incorporating weight-bearing exercises to help maintain her bone health.  She receives diagnostic mammograms and emphasizes the importance of receiving results promptly due to her past experience with breast cancer. No current breast problems.     ALLERGIES:  is allergic to amoxicillin.  MEDICATIONS:  Current Outpatient Medications  Medication Sig Dispense Refill   anastrozole  (ARIMIDEX ) 1 MG tablet Take 1 tablet (1 mg total) by mouth daily. 90 tablet 3   Bioflavonoid Products (VITAMIN C PLUS) 1000 MG TABS Vitamin C     bisoprolol  (ZEBETA ) 10 MG tablet Take 1 tablet (10 mg total) by mouth daily. 90 tablet 3   Cetirizine HCl (ZYRTEC ALLERGY) 10 MG CAPS      clonazePAM  (KLONOPIN ) 1 MG tablet Take 1 mg by mouth every 8 (eight) hours as needed. for anxiety     Coenzyme Q10 (COQ10) 100 MG CAPS Take by mouth.     escitalopram (LEXAPRO) 10 MG tablet Take 10 mg by mouth daily.     hydrochlorothiazide  (MICROZIDE ) 12.5 MG capsule Take 1 capsule (12.5 mg total) by mouth daily. 90 capsule 3   hydrocortisone  (ANUSOL -HC) 25 MG suppository Place 1 suppository (25 mg total) rectally 2 (two) times daily. 24 suppository  0   ibuprofen (ADVIL) 600 MG tablet Take 1 tablet by mouth every 6 (six) hours as needed.     magnesium gluconate (MAGONATE) 500 MG tablet Take 500 mg by mouth daily.      Vitamin D -Vitamin K (VITAMIN K2-VITAMIN D3 PO) Take by mouth.     Zinc 50 MG CAPS zinc     acetaminophen  (TYLENOL ) 500 MG tablet Take 500 mg by mouth every 6 (six) hours as needed. (Patient not taking: Reported on 04/01/2024)     atorvastatin  (LIPITOR) 10 MG tablet Take 1 tablet (10 mg total) by mouth daily. (Patient not taking: Reported on 04/01/2024) 90 tablet 3   No current facility-administered medications for this visit.   Facility-Administered Medications Ordered in Other Visits  Medication Dose Route Frequency Provider Last Rate Last Admin   Sonafine emulsion 1 application  1 application  Topical Once Shannon Agent, MD        PHYSICAL EXAMINATION: ECOG PERFORMANCE STATUS: 1 - Symptomatic but completely ambulatory  Vitals:   04/01/24 1023  BP: 120/84  Pulse: 73  Resp: 16  Temp: 97.7 F (36.5 C)  SpO2: 96%   Filed Weights   04/01/24 1023  Weight: 163 lb (73.9 kg)    LABORATORY DATA:  I have reviewed the data as listed    Latest Ref Rng & Units 10/25/2023    9:23 AM 03/29/2023   10:29 AM 10/05/2022    9:55 AM  CMP  Glucose 70 - 99 mg/dL 96  92  85   BUN 6 - 23 mg/dL 16  17  13    Creatinine 0.40 - 1.20 mg/dL 9.18  9.13  9.22   Sodium 135 - 145 mEq/L 143  141  141   Potassium 3.5 - 5.1 mEq/L 3.8  3.7  3.9   Chloride 96 - 112 mEq/L 103  104  102   CO2 19 - 32 mEq/L 27  31  30    Calcium  8.4 - 10.5 mg/dL 89.8  89.9  89.8   Total Protein 6.0 - 8.3 g/dL 7.1  7.4  7.5   Total Bilirubin 0.2 - 1.2 mg/dL 0.5  0.5  0.4   Alkaline Phos 39 - 117 U/L 83  73  76   AST 0 - 37 U/L 21  24  19    ALT 0 - 35 U/L 23  27  17      Lab Results  Component Value Date   WBC 8.4 10/25/2023   HGB 12.1 10/25/2023   HCT 36.5 10/25/2023   MCV 84.5 10/25/2023   PLT 236.0 10/25/2023   NEUTROABS 5.6 10/25/2023    ASSESSMENT & PLAN:  Malignant neoplasm of upper-outer quadrant of right breast in female, estrogen receptor positive (HCC) 03/09/2020: Right breast UOQ: Stage Ia ILC ER/PR  positive HER2 negative Ki-67 10% 06/09/2020: Right mastectomy: T3N0 I plus stage IIa grade 2 ILC with negative margins 1 lymph node with isolated tumor cells out of 7 Oncotype DX: Score 10: Risk of distant recurrence 3% 09/01/2020: Completed adjuvant radiation 05/16/2020: Genetics: Negative   Current treatment: Anastrozole  started November 2021 Anastrozole  toxicities: Weight gain: We discussed at length about measures to decrease her weight Feeling hot Joint stiffness: Encouraged her to take turmeric.   Breast cancer surveillance: Mammogram 03/10/2024: Benign breast density category C Breast MRI 09/05/2023: Benign, breast density category C Breast exam 04/01/2024:   I ordered diagnostic mammograms I recommended doing breast MRI every other year.  The next breast MRI will be done in  2027.  Lack of exercise: We discussed at length about the importance of staying active.  She likes to dance and therefore I recommended that she join a dancing group.  Osteopenia: On calcium  and vitamin D .  Recommended weightbearing exercises.  Orders Placed This Encounter  Procedures   MM DIAG BREAST TOMO UNI LEFT    Standing Status:   Future    Expected Date:   03/11/2025    Expiration Date:   06/09/2025    Reason for Exam (SYMPTOM  OR DIAGNOSIS REQUIRED):   Annual diagnostic mammogram    Preferred imaging location?:   [facility]    Release to patient:   Immediate [1]   The patient has a good understanding of the overall plan. she agrees with it. she will call with any problems that may develop before the next visit here.  I personally spent a total of 30 minutes in the care of the patient today including preparing to see the patient, getting/reviewing separately obtained history, performing a medically appropriate exam/evaluation, counseling and educating, placing orders, referring and communicating with other health care professionals, documenting clinical information in the EHR, independently  interpreting results, communicating results, and coordinating care.   Viinay K Ladarren Steiner, MD 04/01/24

## 2024-04-01 NOTE — Assessment & Plan Note (Signed)
 03/09/2020: Right breast UOQ: Stage Ia ILC ER/PR positive HER2 negative Ki-67 10% 06/09/2020: Right mastectomy: T3N0 I plus stage IIa grade 2 ILC with negative margins 1 lymph node with isolated tumor cells out of 7 Oncotype DX: Score 10: Risk of distant recurrence 3% 09/01/2020: Completed adjuvant radiation 05/16/2020: Genetics: Negative   Current treatment: Anastrozole  started November 2021 Anastrozole  toxicities: Weight gain: We discussed at length about measures to decrease her weight Feeling hot Joint stiffness: Encouraged her to take turmeric.   Breast cancer surveillance: Mammogram 03/10/2024: Benign breast density category C Breast MRI 09/05/2023: Benign, breast density category C Breast exam 04/01/2024:   I ordered diagnostic mammograms and breast MRIs for next year

## 2024-04-22 ENCOUNTER — Other Ambulatory Visit: Payer: Self-pay | Admitting: Adult Health

## 2024-04-22 ENCOUNTER — Ambulatory Visit: Payer: Self-pay

## 2024-04-22 DIAGNOSIS — K648 Other hemorrhoids: Secondary | ICD-10-CM

## 2024-04-22 MED ORDER — HYDROCORTISONE ACETATE 25 MG RE SUPP: 25.0000 mg | Freq: Two times a day (BID) | RECTAL | 0 refills | Status: AC

## 2024-04-22 NOTE — Telephone Encounter (Addendum)
 FYI Only or Action Required?: Action required by provider: update on patient condition. Requesting Ambicort (last prescribed 2021) for hemorrhoid flare relief  Patient was last seen in primary care on 10/25/2023 by Merna Huxley, NP.  Called Nurse Triage reporting Hemorrhoids.  Symptoms began several days ago.  Interventions attempted: Rest, hydration, or home remedies.  Symptoms are: gradually worsening.  Triage Disposition: See PCP When Office is Open (Within 3 Days)  Patient/caregiver understands and will follow disposition?: No, wishes to speak with PCP  Copied from CRM #[id number]. Topic: Clinical - Red Word Triage >> Apr 22, 2024 11:06 AM Wess RAMAN wrote: Red Word that prompted transfer to Nurse Triage: Patient states her rear has been sore for 3-4 days from hemorrhoids . She needs Ambicort sent to pharmacy.  Pharmacy: [facility] [address], [place] - [address] [place] [address] Phone: [phone] Fax: [phone] Hours: Not open 24 hours Answer Assessment - Initial Assessment Questions 1. SYMPTOM:  What's the main symptom you're concerned about? (e.g., pain, itching, swelling, rash)     Hemorrhoids sore  2. ONSET: When did the hemorrhoids  start?     3-4 days  3. RECTAL PAIN: Do you have any pain around your rectum? How bad is the pain?  (Scale 0-10; or none, mild, moderate, severe)     4-5/10 4. RECTAL ITCHING: Do you have any itching in this area? How bad is the itching?  (Scale 0-10; or none, mild, moderate, severe)     denies 5. CONSTIPATION: Do you have constipation? If Yes, ask: How often do you have a bowel movement (BM)?  (Normal range: 3 times a day to every 3 days)  When was your last BM?       Was constipated and caused flare of hemorrhoids 6. CAUSE: What do you think is causing the anus symptoms?     constipation 7. OTHER SYMPTOMS: Do you have any other symptoms?  (e.g., abdomen pain, fever,  rectal bleeding, vomiting)     Denies  Protocols used: Rectal Symptoms-A-AH

## 2024-04-22 NOTE — Telephone Encounter (Signed)
 Reason for Disposition . [1] Home treatment > 3 days for rectal pain AND [2] not improved  Protocols used: Rectal Symptoms-A-AH

## 2024-04-28 ENCOUNTER — Ambulatory Visit: Attending: General Surgery

## 2024-04-28 VITALS — Wt 161.5 lb

## 2024-04-28 DIAGNOSIS — Z483 Aftercare following surgery for neoplasm: Secondary | ICD-10-CM | POA: Insufficient documentation

## 2024-04-28 NOTE — Therapy (Signed)
 OUTPATIENT PHYSICAL THERAPY SOZO SCREENING NOTE   Patient Name: Terri Gay MRN: [id number] DOB:06-19-1950, 73 y.o., female Today's Date: 04/28/2024  PCP: Merna Huxley, NP REFERRING PROVIDER: Curvin Deward MOULD, MD   PT End of Session - 04/28/24 1019     Visit Number 2   # unchanged due to screen only   PT Start Time 1017    PT Stop Time 1021    PT Time Calculation (min) 4 min    Activity Tolerance Patient tolerated treatment well    Behavior During Therapy [facility] for tasks assessed/performed          Past Medical History:  Diagnosis Date   Allergy    seasonal   Anxiety    Arthritis    Breast cancer (HCC)    Cancer (HCC) 2021   right breast IDC with DCIS   Depression    Diverticulosis 10/07/2010   Colonoscopy   GERD (gastroesophageal reflux disease)    OTC   Hemorrhoids    History of chicken pox    History of radiation therapy 07/21/2020-09/01/2020   Right breast- Dr. Lynwood Nasuti   Hyperlipidemia    Hypertension    Knee pain, left 10/24/2013   Osteopenia 04/25/2013   Osteoporosis    Palpitations 04/26/2013   Preventative health care 04/26/2013   Past Surgical History:  Procedure Laterality Date   MASTECTOMY     MASTECTOMY W/ SENTINEL NODE BIOPSY Right 06/09/2020   Procedure: RIGHT MASTECTOMY WITH RIGHT AXILLARY SENTINEL LYMPH NODE BIOPSY;  Surgeon: Curvin Deward MOULD, MD;  Location: [facility];  Service: General;  Laterality: Right;   NO PAST SURGERIES     Patient Active Problem List   Diagnosis Date Noted   Cancer of right female breast (HCC) 06/09/2020   Genetic testing 05/17/2020   Malignant neoplasm of upper-outer quadrant of right breast in female, estrogen receptor positive (HCC) 03/11/2020   Knee pain, left 10/24/2013   Palpitations 04/26/2013   Preventative health care 04/26/2013   Osteopenia 04/25/2013   Acute esophagitis 09/19/2012   GERD (gastroesophageal reflux disease) 03/03/2012   Essential hypertension 08/23/2011   Rectal bleeding  06/23/2011   Hyperlipidemia 03/02/2011   Depression with anxiety 01/26/2011   Insomnia 01/26/2011   Diverticulosis 01/17/2011   Hemorrhoids 01/17/2011   Osteoarthritis 01/17/2011    REFERRING DIAG: right breast cancer at risk for lymphedema  THERAPY DIAG: Aftercare following surgery for neoplasm  PERTINENT HISTORY: Patient was diagnosed on 03/01/2020 with right grade II invasive lobular carcinoma breast cancer. She underwent a right mastectomy and sentinel node biopsy (1/6 positive axillary lymph nodes) on 06/09/2020. It is ER/PR positive and HER2 negative with a Ki67 of 10%.    PRECAUTIONS: right UE Lymphedema risk  SUBJECTIVE: Pt returns for her last 6 month L-Dex screen.   PAIN:  Are you having pain? No  SOZO SCREENING: Patient was assessed today using the SOZO machine to determine the lymphedema index score. This was compared to her baseline score. It was determined that she is within the recommended range when compared to her baseline and no further action is needed at this time. She will continue SOZO screenings. These are done every 3 months for 2 years post operatively followed by every 6 months for 2 years, and then annually.   L-DEX FLOWSHEETS - 04/28/24 1000       L-DEX LYMPHEDEMA SCREENING   Measurement Type Unilateral    L-DEX MEASUREMENT EXTREMITY Upper Extremity    POSITION  Standing  DOMINANT SIDE Right    At Risk Side Right    BASELINE SCORE (UNILATERAL) -6.1    L-DEX SCORE (UNILATERAL) -3.7    VALUE CHANGE (UNILAT) 2.4         P: Pt to transition to annual at this time.    Berwyn Knights, PTA 04/28/24 10:20 AM

## 2025-04-07 ENCOUNTER — Inpatient Hospital Stay: Admitting: Hematology and Oncology

## 2025-05-04 ENCOUNTER — Ambulatory Visit: Attending: General Surgery
